# Patient Record
Sex: Female | Born: 1965
Health system: Southern US, Community
[De-identification: ages and names within clinical notes are randomized; demographics above are authoritative.]

## PROBLEM LIST (undated history)

## (undated) DIAGNOSIS — R7309 Other abnormal glucose: Secondary | ICD-10-CM

## (undated) DIAGNOSIS — M1990 Unspecified osteoarthritis, unspecified site: Secondary | ICD-10-CM

## (undated) DIAGNOSIS — M8580 Other specified disorders of bone density and structure, unspecified site: Secondary | ICD-10-CM

## (undated) DIAGNOSIS — K219 Gastro-esophageal reflux disease without esophagitis: Secondary | ICD-10-CM

## (undated) DIAGNOSIS — D649 Anemia, unspecified: Secondary | ICD-10-CM

## (undated) DIAGNOSIS — C439 Malignant melanoma of skin, unspecified: Secondary | ICD-10-CM

## (undated) DIAGNOSIS — I10 Essential (primary) hypertension: Secondary | ICD-10-CM

## (undated) DIAGNOSIS — K5730 Diverticulosis of large intestine without perforation or abscess without bleeding: Secondary | ICD-10-CM

## (undated) DIAGNOSIS — D473 Essential (hemorrhagic) thrombocythemia: Secondary | ICD-10-CM

## (undated) DIAGNOSIS — D259 Leiomyoma of uterus, unspecified: Secondary | ICD-10-CM

## (undated) DIAGNOSIS — E669 Obesity, unspecified: Secondary | ICD-10-CM

## (undated) DIAGNOSIS — G4730 Sleep apnea, unspecified: Secondary | ICD-10-CM

## (undated) HISTORY — DX: Essential (hemorrhagic) thrombocythemia: D47.3

## (undated) HISTORY — DX: Malignant melanoma of skin, unspecified: C43.9

## (undated) HISTORY — PX: COLONOSCOPY: SHX174

## (undated) HISTORY — DX: Anemia, unspecified: D64.9

## (undated) HISTORY — DX: Other specified disorders of bone density and structure, unspecified site: M85.80

## (undated) HISTORY — PX: DIAGNOSTIC LAPAROSCOPY: SUR761

## (undated) HISTORY — DX: Gastro-esophageal reflux disease without esophagitis: K21.9

## (undated) HISTORY — PX: UTERINE FIBROID SURGERY: SHX826

## (undated) HISTORY — DX: Essential (primary) hypertension: I10

## (undated) HISTORY — DX: Obesity, unspecified: E66.9

## (undated) HISTORY — DX: Leiomyoma of uterus, unspecified: D25.9

## (undated) HISTORY — DX: Other abnormal glucose: R73.09

## (undated) HISTORY — PX: OTHER SURGICAL HISTORY: SHX169

## (undated) HISTORY — DX: Diverticulosis of large intestine without perforation or abscess without bleeding: K57.30

---

## 1999-09-24 ENCOUNTER — Other Ambulatory Visit: Admission: RE | Admit: 1999-09-24 | Discharge: 1999-09-24 | Payer: Self-pay | Admitting: Gynecology

## 2000-10-06 ENCOUNTER — Other Ambulatory Visit: Admission: RE | Admit: 2000-10-06 | Discharge: 2000-10-06 | Payer: Self-pay | Admitting: Gynecology

## 2001-10-19 ENCOUNTER — Other Ambulatory Visit: Admission: RE | Admit: 2001-10-19 | Discharge: 2001-10-19 | Payer: Self-pay | Admitting: Gynecology

## 2002-04-11 ENCOUNTER — Other Ambulatory Visit: Admission: RE | Admit: 2002-04-11 | Discharge: 2002-04-11 | Payer: Self-pay | Admitting: Gynecology

## 2003-02-01 ENCOUNTER — Other Ambulatory Visit: Admission: RE | Admit: 2003-02-01 | Discharge: 2003-02-01 | Payer: Self-pay | Admitting: Gynecology

## 2004-05-01 ENCOUNTER — Other Ambulatory Visit: Admission: RE | Admit: 2004-05-01 | Discharge: 2004-05-01 | Payer: Self-pay | Admitting: Gynecology

## 2004-11-03 HISTORY — PX: TUBAL LIGATION: SHX77

## 2004-11-03 HISTORY — PX: KNEE ARTHROSCOPY: SUR90

## 2005-05-05 ENCOUNTER — Other Ambulatory Visit: Admission: RE | Admit: 2005-05-05 | Discharge: 2005-05-05 | Payer: Self-pay | Admitting: Gynecology

## 2005-05-07 ENCOUNTER — Ambulatory Visit: Payer: Self-pay | Admitting: Internal Medicine

## 2005-06-16 ENCOUNTER — Ambulatory Visit: Payer: Self-pay | Admitting: Internal Medicine

## 2005-06-19 ENCOUNTER — Encounter (INDEPENDENT_AMBULATORY_CARE_PROVIDER_SITE_OTHER): Payer: Self-pay | Admitting: Specialist

## 2005-06-19 ENCOUNTER — Ambulatory Visit (HOSPITAL_BASED_OUTPATIENT_CLINIC_OR_DEPARTMENT_OTHER): Admission: RE | Admit: 2005-06-19 | Discharge: 2005-06-19 | Payer: Self-pay | Admitting: Gynecology

## 2005-06-19 HISTORY — PX: HYSTEROSCOPY: SHX211

## 2005-07-16 ENCOUNTER — Ambulatory Visit: Payer: Self-pay | Admitting: Internal Medicine

## 2005-08-22 ENCOUNTER — Ambulatory Visit: Payer: Self-pay | Admitting: Internal Medicine

## 2005-10-13 ENCOUNTER — Ambulatory Visit: Payer: Self-pay | Admitting: Internal Medicine

## 2005-10-16 ENCOUNTER — Ambulatory Visit: Payer: Self-pay | Admitting: Oncology

## 2005-12-05 ENCOUNTER — Ambulatory Visit: Payer: Self-pay | Admitting: Oncology

## 2006-01-05 ENCOUNTER — Ambulatory Visit: Payer: Self-pay | Admitting: Internal Medicine

## 2006-05-04 ENCOUNTER — Ambulatory Visit: Payer: Self-pay | Admitting: Internal Medicine

## 2006-05-08 ENCOUNTER — Other Ambulatory Visit: Admission: RE | Admit: 2006-05-08 | Discharge: 2006-05-08 | Payer: Self-pay | Admitting: Gynecology

## 2006-06-10 ENCOUNTER — Ambulatory Visit (HOSPITAL_BASED_OUTPATIENT_CLINIC_OR_DEPARTMENT_OTHER): Admission: RE | Admit: 2006-06-10 | Discharge: 2006-06-10 | Payer: Self-pay | Admitting: Orthopedic Surgery

## 2006-07-16 ENCOUNTER — Ambulatory Visit: Payer: Self-pay | Admitting: Oncology

## 2006-07-20 LAB — CBC WITH DIFFERENTIAL/PLATELET
Basophils Absolute: 0 10*3/uL (ref 0.0–0.1)
Eosinophils Absolute: 0.2 10*3/uL (ref 0.0–0.5)
HGB: 14.4 g/dL (ref 11.6–15.9)
LYMPH%: 25.1 % (ref 14.0–48.0)
MONO#: 0.8 10*3/uL (ref 0.1–0.9)
NEUT#: 4.5 10*3/uL (ref 1.5–6.5)
Platelets: 724 10*3/uL — ABNORMAL HIGH (ref 145–400)
RBC: 4.69 10*6/uL (ref 3.70–5.32)
WBC: 7.5 10*3/uL (ref 3.9–10.0)

## 2006-07-20 LAB — MORPHOLOGY: RBC Comments: NORMAL

## 2006-07-20 LAB — CHCC SMEAR

## 2006-09-07 ENCOUNTER — Ambulatory Visit: Payer: Self-pay | Admitting: Internal Medicine

## 2006-10-07 ENCOUNTER — Ambulatory Visit: Payer: Self-pay | Admitting: Internal Medicine

## 2007-01-13 ENCOUNTER — Ambulatory Visit: Payer: Self-pay | Admitting: Oncology

## 2007-01-13 ENCOUNTER — Ambulatory Visit: Payer: Self-pay | Admitting: Internal Medicine

## 2007-01-18 LAB — CBC WITH DIFFERENTIAL/PLATELET
BASO%: 0.5 % (ref 0.0–2.0)
HCT: 39.5 % (ref 34.8–46.6)
MCHC: 34.3 g/dL (ref 32.0–36.0)
MONO#: 0.7 10*3/uL (ref 0.1–0.9)
RBC: 4.5 10*6/uL (ref 3.70–5.32)
WBC: 7.6 10*3/uL (ref 3.9–10.0)
lymph#: 1.9 10*3/uL (ref 0.9–3.3)

## 2007-01-18 LAB — MORPHOLOGY: RBC Comments: NORMAL

## 2007-05-25 ENCOUNTER — Other Ambulatory Visit: Admission: RE | Admit: 2007-05-25 | Discharge: 2007-05-25 | Payer: Self-pay | Admitting: Gynecology

## 2007-06-03 ENCOUNTER — Encounter: Payer: Self-pay | Admitting: Internal Medicine

## 2007-07-12 ENCOUNTER — Ambulatory Visit: Payer: Self-pay | Admitting: Internal Medicine

## 2007-07-15 ENCOUNTER — Ambulatory Visit: Payer: Self-pay | Admitting: Oncology

## 2007-07-19 LAB — CBC WITH DIFFERENTIAL/PLATELET
BASO%: 0.4 % (ref 0.0–2.0)
HCT: 39.5 % (ref 34.8–46.6)
LYMPH%: 24.2 % (ref 14.0–48.0)
MCH: 31 pg (ref 26.0–34.0)
MCHC: 35.2 g/dL (ref 32.0–36.0)
MCV: 88.1 fL (ref 81.0–101.0)
MONO%: 7.5 % (ref 0.0–13.0)
NEUT%: 65.5 % (ref 39.6–76.8)
Platelets: 603 10*3/uL — ABNORMAL HIGH (ref 145–400)
RBC: 4.48 10*6/uL (ref 3.70–5.32)

## 2007-07-19 LAB — MORPHOLOGY: RBC Comments: NORMAL

## 2007-09-10 ENCOUNTER — Encounter: Payer: Self-pay | Admitting: Internal Medicine

## 2007-09-10 DIAGNOSIS — I10 Essential (primary) hypertension: Secondary | ICD-10-CM | POA: Insufficient documentation

## 2007-09-10 DIAGNOSIS — D473 Essential (hemorrhagic) thrombocythemia: Secondary | ICD-10-CM | POA: Insufficient documentation

## 2007-09-10 DIAGNOSIS — R7309 Other abnormal glucose: Secondary | ICD-10-CM | POA: Insufficient documentation

## 2007-09-10 DIAGNOSIS — D75839 Thrombocytosis, unspecified: Secondary | ICD-10-CM | POA: Insufficient documentation

## 2007-09-10 DIAGNOSIS — D259 Leiomyoma of uterus, unspecified: Secondary | ICD-10-CM | POA: Insufficient documentation

## 2007-09-10 DIAGNOSIS — E669 Obesity, unspecified: Secondary | ICD-10-CM | POA: Insufficient documentation

## 2007-09-13 ENCOUNTER — Telehealth (INDEPENDENT_AMBULATORY_CARE_PROVIDER_SITE_OTHER): Payer: Self-pay | Admitting: *Deleted

## 2007-09-13 ENCOUNTER — Ambulatory Visit: Payer: Self-pay | Admitting: Internal Medicine

## 2008-01-19 ENCOUNTER — Ambulatory Visit: Payer: Self-pay | Admitting: Oncology

## 2008-01-19 LAB — CBC WITH DIFFERENTIAL/PLATELET
Basophils Absolute: 0 10*3/uL (ref 0.0–0.1)
Eosinophils Absolute: 0.3 10*3/uL (ref 0.0–0.5)
HCT: 40.7 % (ref 34.8–46.6)
HGB: 14.2 g/dL (ref 11.6–15.9)
LYMPH%: 24.9 % (ref 14.0–48.0)
MONO#: 0.6 10*3/uL (ref 0.1–0.9)
NEUT#: 6 10*3/uL (ref 1.5–6.5)
NEUT%: 64.6 % (ref 39.6–76.8)
Platelets: 723 10*3/uL — ABNORMAL HIGH (ref 145–400)
RBC: 4.65 10*6/uL (ref 3.70–5.32)
WBC: 9.3 10*3/uL (ref 3.9–10.0)

## 2008-01-19 LAB — MORPHOLOGY: RBC Comments: NORMAL

## 2008-03-13 ENCOUNTER — Ambulatory Visit: Payer: Self-pay | Admitting: Internal Medicine

## 2008-05-25 ENCOUNTER — Other Ambulatory Visit: Admission: RE | Admit: 2008-05-25 | Discharge: 2008-05-25 | Payer: Self-pay | Admitting: Gynecology

## 2008-07-12 ENCOUNTER — Ambulatory Visit: Payer: Self-pay | Admitting: Oncology

## 2008-07-17 ENCOUNTER — Encounter: Payer: Self-pay | Admitting: Internal Medicine

## 2008-07-17 LAB — CBC WITH DIFFERENTIAL/PLATELET
BASO%: 0.4 % (ref 0.0–2.0)
Basophils Absolute: 0 10*3/uL (ref 0.0–0.1)
Eosinophils Absolute: 0.2 10*3/uL (ref 0.0–0.5)
HCT: 40.3 % (ref 34.8–46.6)
HGB: 13.7 g/dL (ref 11.6–15.9)
LYMPH%: 25.6 % (ref 14.0–48.0)
MONO#: 0.4 10*3/uL (ref 0.1–0.9)
NEUT#: 4.5 10*3/uL (ref 1.5–6.5)
NEUT%: 65.9 % (ref 39.6–76.8)
Platelets: 568 10*3/uL — ABNORMAL HIGH (ref 145–400)
WBC: 6.8 10*3/uL (ref 3.9–10.0)
lymph#: 1.7 10*3/uL (ref 0.9–3.3)

## 2008-07-17 LAB — MORPHOLOGY: PLT EST: INCREASED

## 2008-08-24 ENCOUNTER — Encounter: Payer: Self-pay | Admitting: Internal Medicine

## 2008-09-12 ENCOUNTER — Ambulatory Visit: Payer: Self-pay | Admitting: Internal Medicine

## 2008-09-12 LAB — CONVERTED CEMR LAB
AST: 16 units/L (ref 0–37)
BUN: 9 mg/dL (ref 6–23)
Cholesterol: 148 mg/dL (ref 0–200)
Creatinine, Ser: 0.8 mg/dL (ref 0.4–1.2)
GFR calc Af Amer: 100 mL/min
GFR calc non Af Amer: 83 mL/min
LDL Cholesterol: 80 mg/dL (ref 0–99)
Total CHOL/HDL Ratio: 3.4
Triglycerides: 123 mg/dL (ref 0–149)
VLDL: 25 mg/dL (ref 0–40)

## 2008-09-18 ENCOUNTER — Ambulatory Visit: Payer: Self-pay | Admitting: Internal Medicine

## 2008-09-22 ENCOUNTER — Telehealth (INDEPENDENT_AMBULATORY_CARE_PROVIDER_SITE_OTHER): Payer: Self-pay | Admitting: *Deleted

## 2009-01-04 ENCOUNTER — Ambulatory Visit: Payer: Self-pay | Admitting: Oncology

## 2009-01-08 ENCOUNTER — Encounter: Payer: Self-pay | Admitting: Internal Medicine

## 2009-01-08 LAB — CBC WITH DIFFERENTIAL/PLATELET
BASO%: 0.5 % (ref 0.0–2.0)
Basophils Absolute: 0 10*3/uL (ref 0.0–0.1)
Eosinophils Absolute: 0.2 10*3/uL (ref 0.0–0.5)
HCT: 39.5 % (ref 34.8–46.6)
HGB: 13.4 g/dL (ref 11.6–15.9)
LYMPH%: 29.4 % (ref 14.0–49.7)
MCHC: 33.9 g/dL (ref 31.5–36.0)
MONO#: 0.6 10*3/uL (ref 0.1–0.9)
NEUT%: 58.2 % (ref 38.4–76.8)
Platelets: 645 10*3/uL — ABNORMAL HIGH (ref 145–400)
WBC: 7.1 10*3/uL (ref 3.9–10.3)
lymph#: 2.1 10*3/uL (ref 0.9–3.3)

## 2009-01-08 LAB — MORPHOLOGY: PLT EST: INCREASED

## 2009-03-19 ENCOUNTER — Ambulatory Visit: Payer: Self-pay | Admitting: Internal Medicine

## 2009-03-19 LAB — CONVERTED CEMR LAB
BUN: 11 mg/dL (ref 6–23)
Basophils Relative: 0.6 % (ref 0.0–3.0)
CO2: 30 meq/L (ref 19–32)
Chloride: 108 meq/L (ref 96–112)
Creatinine, Ser: 0.7 mg/dL (ref 0.4–1.2)
Eosinophils Absolute: 0.2 10*3/uL (ref 0.0–0.7)
Eosinophils Relative: 2.9 % (ref 0.0–5.0)
Glucose, Bld: 100 mg/dL — ABNORMAL HIGH (ref 70–99)
Hemoglobin: 13.7 g/dL (ref 12.0–15.0)
LDL Cholesterol: 86 mg/dL (ref 0–99)
Lymphocytes Relative: 22.5 % (ref 12.0–46.0)
MCHC: 34.7 g/dL (ref 30.0–36.0)
Monocytes Relative: 7.1 % (ref 3.0–12.0)
Neutrophils Relative %: 66.9 % (ref 43.0–77.0)
Potassium: 3.9 meq/L (ref 3.5–5.1)
RBC: 4.41 M/uL (ref 3.87–5.11)
Total CHOL/HDL Ratio: 3
VLDL: 19.2 mg/dL (ref 0.0–40.0)
WBC: 7 10*3/uL (ref 4.5–10.5)

## 2009-05-03 LAB — CONVERTED CEMR LAB: Pap Smear: NORMAL

## 2009-06-05 ENCOUNTER — Encounter: Payer: Self-pay | Admitting: Gynecology

## 2009-06-05 ENCOUNTER — Other Ambulatory Visit: Admission: RE | Admit: 2009-06-05 | Discharge: 2009-06-05 | Payer: Self-pay | Admitting: Gynecology

## 2009-06-05 ENCOUNTER — Ambulatory Visit: Payer: Self-pay | Admitting: Gynecology

## 2009-07-12 ENCOUNTER — Ambulatory Visit: Payer: Self-pay | Admitting: Oncology

## 2009-07-16 ENCOUNTER — Encounter: Payer: Self-pay | Admitting: Internal Medicine

## 2009-07-16 LAB — CBC WITH DIFFERENTIAL/PLATELET
BASO%: 0.3 % (ref 0.0–2.0)
Basophils Absolute: 0 10*3/uL (ref 0.0–0.1)
EOS%: 2.4 % (ref 0.0–7.0)
HGB: 13.7 g/dL (ref 11.6–15.9)
MCH: 30.5 pg (ref 25.1–34.0)
NEUT#: 4.3 10*3/uL (ref 1.5–6.5)
RDW: 13.8 % (ref 11.2–14.5)
lymph#: 1.5 10*3/uL (ref 0.9–3.3)

## 2009-09-03 ENCOUNTER — Telehealth: Payer: Self-pay | Admitting: Internal Medicine

## 2009-09-12 ENCOUNTER — Ambulatory Visit: Payer: Self-pay | Admitting: Internal Medicine

## 2009-09-12 LAB — CONVERTED CEMR LAB
Alkaline Phosphatase: 54 units/L (ref 39–117)
Basophils Absolute: 0 10*3/uL (ref 0.0–0.1)
Bilirubin, Direct: 0.1 mg/dL (ref 0.0–0.3)
CO2: 27 meq/L (ref 19–32)
Calcium: 8.8 mg/dL (ref 8.4–10.5)
Creatinine, Ser: 0.8 mg/dL (ref 0.4–1.2)
Eosinophils Absolute: 0.2 10*3/uL (ref 0.0–0.7)
GFR calc non Af Amer: 82.22 mL/min (ref >60)
HDL: 48.2 mg/dL (ref >39.00)
Lymphocytes Relative: 25.4 % (ref 12.0–46.0)
MCHC: 34 g/dL (ref 30.0–36.0)
Neutrophils Relative %: 64.3 % (ref 43.0–77.0)
Nitrite: NEGATIVE
RBC: 4.57 M/uL (ref 3.87–5.11)
RDW: 13.2 % (ref 11.5–14.6)
Specific Gravity, Urine: 1.02 (ref 1.000–1.030)
Total CHOL/HDL Ratio: 4
Total Protein, Urine: NEGATIVE mg/dL
Triglycerides: 145 mg/dL (ref 0.0–149.0)
VLDL: 29 mg/dL (ref 0.0–40.0)
pH: 7 (ref 5.0–8.0)

## 2009-09-17 ENCOUNTER — Ambulatory Visit: Payer: Self-pay | Admitting: Internal Medicine

## 2010-01-10 ENCOUNTER — Ambulatory Visit: Payer: Self-pay | Admitting: Oncology

## 2010-01-14 ENCOUNTER — Encounter: Payer: Self-pay | Admitting: Internal Medicine

## 2010-01-14 LAB — CBC WITH DIFFERENTIAL/PLATELET
Basophils Absolute: 0 10*3/uL (ref 0.0–0.1)
EOS%: 2.3 % (ref 0.0–7.0)
Eosinophils Absolute: 0.2 10*3/uL (ref 0.0–0.5)
HGB: 14.3 g/dL (ref 11.6–15.9)
NEUT#: 6.4 10*3/uL (ref 1.5–6.5)
RDW: 13.8 % (ref 11.2–14.5)
lymph#: 2.3 10*3/uL (ref 0.9–3.3)

## 2010-01-14 LAB — MORPHOLOGY: PLT EST: INCREASED

## 2010-03-15 ENCOUNTER — Ambulatory Visit: Payer: Self-pay | Admitting: Internal Medicine

## 2010-03-15 LAB — CONVERTED CEMR LAB
Chloride: 103 meq/L (ref 96–112)
Cholesterol: 164 mg/dL (ref 0–200)
Creatinine, Ser: 0.8 mg/dL (ref 0.4–1.2)
LDL Cholesterol: 87 mg/dL (ref 0–99)
Potassium: 4.8 meq/L (ref 3.5–5.1)
Triglycerides: 135 mg/dL (ref 0.0–149.0)
VLDL: 27 mg/dL (ref 0.0–40.0)

## 2010-03-19 ENCOUNTER — Ambulatory Visit: Payer: Self-pay | Admitting: Internal Medicine

## 2010-03-19 DIAGNOSIS — K219 Gastro-esophageal reflux disease without esophagitis: Secondary | ICD-10-CM | POA: Insufficient documentation

## 2010-06-03 LAB — HM MAMMOGRAPHY: HM Mammogram: NORMAL

## 2010-06-10 ENCOUNTER — Other Ambulatory Visit: Admission: RE | Admit: 2010-06-10 | Discharge: 2010-06-10 | Payer: Self-pay | Admitting: Gynecology

## 2010-06-10 ENCOUNTER — Ambulatory Visit: Payer: Self-pay | Admitting: Gynecology

## 2010-07-11 ENCOUNTER — Ambulatory Visit: Payer: Self-pay | Admitting: Oncology

## 2010-07-15 ENCOUNTER — Encounter: Payer: Self-pay | Admitting: Internal Medicine

## 2010-07-15 LAB — CBC WITH DIFFERENTIAL/PLATELET
Basophils Absolute: 0 10*3/uL (ref 0.0–0.1)
Eosinophils Absolute: 0.2 10*3/uL (ref 0.0–0.5)
HGB: 14.4 g/dL (ref 11.6–15.9)
LYMPH%: 23.3 % (ref 14.0–49.7)
MCV: 89.6 fL (ref 79.5–101.0)
MONO%: 9.1 % (ref 0.0–14.0)
NEUT#: 5.1 10*3/uL (ref 1.5–6.5)
Platelets: 557 10*3/uL — ABNORMAL HIGH (ref 145–400)

## 2010-07-15 LAB — MORPHOLOGY

## 2010-07-15 LAB — CHCC SMEAR

## 2010-09-17 ENCOUNTER — Ambulatory Visit: Payer: Self-pay | Admitting: Internal Medicine

## 2010-09-19 LAB — CONVERTED CEMR LAB
AST: 23 units/L (ref 0–37)
BUN: 13 mg/dL (ref 6–23)
Basophils Relative: 0.3 % (ref 0.0–3.0)
Bilirubin Urine: NEGATIVE
Calcium: 9.2 mg/dL (ref 8.4–10.5)
Cholesterol: 180 mg/dL (ref 0–200)
Creatinine, Ser: 0.9 mg/dL (ref 0.4–1.2)
Eosinophils Absolute: 0.2 10*3/uL (ref 0.0–0.7)
GFR calc non Af Amer: 73.33 mL/min (ref >60)
HDL: 53 mg/dL (ref >39.00)
Hemoglobin, Urine: NEGATIVE
Lymphs Abs: 1.9 10*3/uL (ref 0.7–4.0)
MCHC: 34.2 g/dL (ref 30.0–36.0)
MCV: 90.7 fL (ref 78.0–100.0)
Monocytes Absolute: 0.6 10*3/uL (ref 0.1–1.0)
Neutrophils Relative %: 63.6 % (ref 43.0–77.0)
Potassium: 5 meq/L (ref 3.5–5.1)
RBC: 4.59 M/uL (ref 3.87–5.11)
TSH: 2.94 microintl units/mL (ref 0.35–5.50)
Total Bilirubin: 0.8 mg/dL (ref 0.3–1.2)
Total Protein, Urine: NEGATIVE mg/dL
Triglycerides: 148 mg/dL (ref 0.0–149.0)
Urine Glucose: NEGATIVE mg/dL
VLDL: 29.6 mg/dL (ref 0.0–40.0)
pH: 6 (ref 5.0–8.0)

## 2010-09-20 ENCOUNTER — Ambulatory Visit: Payer: Self-pay | Admitting: Internal Medicine

## 2010-12-05 NOTE — Assessment & Plan Note (Signed)
Summary: 6 mos f/u // cd   Vital Signs:  Patient profile:   45 year old female Height:      66 inches Weight:      245.25 pounds BMI:     39.73 O2 Sat:      98 % on Room air Temp:     97.9 degrees F oral Pulse rate:   75 / minute BP sitting:   122 / 72  (left arm) Cuff size:   large  Vitals Entered ByZella Ball Ewing (Mar 19, 2010 9:27 AM)  O2 Flow:  Room air CC: 6 Month ROV/RE   CC:  6 Month ROV/RE.  History of Present Illness: not taking the metformin due to GI upset and was only taking for promoting wt loss and strong FH of DM - no hx of DM; has mild intermittent reflux symtpoms but no n/v, abd pain, bowel change , dysphagia, or blood;  no recent wt loss, night sweats, fever or other constitutional symtpoms.  Pt denies CP, sob, doe, wheezing, orthopnea, pnd, worsening LE edema, palps, dizziness or syncope  Pt denies new neuro symptoms such as headache, facial or extremity weakness  Pt denies polydipsia, polyuria, or low sugar symptoms such as shakiness improved with eating.  Overall good compliance with meds, trying to follow low chol, diet, wt stable, little excercise however   Problems Prior to Update: 1)  Preventive Health Care  (ICD-V70.0) 2)  Thrombocythemia  (ICD-238.71) 3)  Other Abnormal Glucose  (ICD-790.29) 4)  Obesity, Unspecified  (ICD-278.00) 5)  Fibroids, Uterus  (ICD-218.9) 6)  Hypertension  (ICD-401.9)  Medications Prior to Update: 1)  Amlodipine Besylate 5 Mg Tabs (Amlodipine Besylate) .Marland Kitchen.. 1 By Mouth Once Daily 2)  Metformin Hcl 500 Mg  Tb24 (Metformin Hcl) .... One By Mouth Two Times A Day 3)  Low-Dose Aspirin 81 Mg  Tabs (Aspirin) .... One By Mouth Once Daily 4)  Benazepril Hcl 10 Mg Tabs (Benazepril Hcl) .Marland Kitchen.. 1 By Mouth Once Daily  Current Medications (verified): 1)  Amlodipine Besylate 5 Mg Tabs (Amlodipine Besylate) .Marland Kitchen.. 1 By Mouth Once Daily 2)  Low-Dose Aspirin 81 Mg  Tabs (Aspirin) .... One By Mouth Once Daily 3)  Benazepril Hcl 10 Mg Tabs  (Benazepril Hcl) .Marland Kitchen.. 1 By Mouth Once Daily 4)  Nexium 40 Mg Cpdr (Esomeprazole Magnesium) .Marland Kitchen.. 1po Once Daily As Needed  Allergies (verified): No Known Drug Allergies  Past History:  Past Surgical History: Last updated: 03/13/2008 Tubal ligation 2006 Right Knee surgery - arthroscopic 2007 (Dr. Audery Amel)  Social History: Last updated: 03/13/2008 Married with 3 children ages 73, 38, and 48. She is employed as a Architectural technologist. Husband is a podiatrist Never Smoked Alcohol use-no  Risk Factors: Smoking Status: never (03/13/2008)  Past Medical History: Hypertension Abnormal gluocse and Obesity Mild Thrombocytopenia - idiopathic (Followed by Dr. Cyndie Chime) Uterine Fibroids GERD  Review of Systems       all otherwise negative per pt -    Physical Exam  General:  alert and overweight-appearing.   Head:  normocephalic and atraumatic.   Eyes:  vision grossly intact, pupils equal, and pupils round.   Ears:  R ear normal and L ear normal.   Nose:  no external deformity and no nasal discharge.   Mouth:  no gingival abnormalities and pharynx pink and moist.   Neck:  supple and no masses.   Lungs:  normal respiratory effort and normal breath sounds.   Heart:  normal rate and regular rhythm.  Abdomen:  soft, non-tender, and normal bowel sounds.   Extremities:  no edema, no erythema    Impression & Recommendations:  Problem # 1:  GERD (ICD-530.81)  Her updated medication list for this problem includes:    Nexium 40 Mg Cpdr (Esomeprazole magnesium) .Marland Kitchen... 1po once daily as needed treat as above, f/u any worsening signs or symptoms   Problem # 2:  HYPERTENSION (ICD-401.9)  Her updated medication list for this problem includes:    Amlodipine Besylate 5 Mg Tabs (Amlodipine besylate) .Marland Kitchen... 1 by mouth once daily    Benazepril Hcl 10 Mg Tabs (Benazepril hcl) .Marland Kitchen... 1 by mouth once daily  BP today: 122/72 Prior BP: 118/80 (09/17/2009)  Labs Reviewed: K+: 4.8  (03/15/2010) Creat: : 0.8 (03/15/2010)   Chol: 164 (03/15/2010)   HDL: 50.10 (03/15/2010)   LDL: 87 (03/15/2010)   TG: 135.0 (03/15/2010) stable overall by hx and exam, ok to continue meds/tx as is    Complete Medication List: 1)  Amlodipine Besylate 5 Mg Tabs (Amlodipine besylate) .Marland Kitchen.. 1 by mouth once daily 2)  Low-dose Aspirin 81 Mg Tabs (Aspirin) .... One by mouth once daily 3)  Benazepril Hcl 10 Mg Tabs (Benazepril hcl) .Marland Kitchen.. 1 by mouth once daily 4)  Nexium 40 Mg Cpdr (Esomeprazole magnesium) .Marland Kitchen.. 1po once daily as needed  Patient Instructions: 1)  Continue all previous medications as before this visit  2)  Please take all new medications as prescribed  - the nexium 3)  Please schedule a follow-up appointment in 6 months with: CPX labs and: 4)  HbgA1C prior to visit, ICD-9: 250.02 Prescriptions: AMLODIPINE BESYLATE 5 MG TABS (AMLODIPINE BESYLATE) 1 by mouth once daily  #90 x 3   Entered and Authorized by:   Corwin Levins MD   Signed by:   Corwin Levins MD on 03/19/2010   Method used:   Print then Give to Patient   RxID:   [id number] BENAZEPRIL HCL 10 MG TABS (BENAZEPRIL HCL) 1 by mouth once daily  #90 x 3   Entered and Authorized by:   Corwin Levins MD   Signed by:   Corwin Levins MD on 03/19/2010   Method used:   Print then Give to Patient   RxID:   [id number] NEXIUM 40 MG CPDR (ESOMEPRAZOLE MAGNESIUM) 1po once daily as needed  #90 x 3   Entered and Authorized by:   Corwin Levins MD   Signed by:   Corwin Levins MD on 03/19/2010   Method used:   Print then Give to Patient   RxID:   [id number]

## 2010-12-05 NOTE — Assessment & Plan Note (Signed)
Summary: 6 mo rov /nws  #   Vital Signs:  Patient profile:   45 year old female Height:      66 inches Weight:      246.13 pounds BMI:     39.87 O2 Sat:      96 % on Room air Temp:     98.7 degrees F oral Pulse rate:   84 / minute BP sitting:   120 / 72  (left arm) Cuff size:   large  Vitals Entered By: Zella Ball Ewing CMA Duncan Dull) (September 20, 2010 9:28 AM)  O2 Flow:  Room air  Preventive Care Screening  Mammogram:    Date:  06/03/2010    Results:  normal   Pap Smear:    Date:  05/03/2010    Results:  normal   CC: 6 month ROV/RE   CC:  6 month ROV/RE.  History of Present Illness: here for wellness, gained overall 10 lbs in the past yr;  still with some recurring right knee pain, on swelling, overall mild; has hx of meniscal tear s/p surgury;  Pt denies CP, worsening sob, doe, wheezing, orthopnea, pnd, worsening LE edema, palps, dizziness or syncope  Pt denies new neuro symptoms such as headache, facial or extremity weakness  Pt denies polydipsia, polyuria.  Overall good compliance with meds, trying to follow low chol, DM diet, wt stable, little excercise however  Denies worsening depressive symptoms, suicidal ideation, or panic.   No fever, wt loss, night sweats, loss of appetite or other constitutional symptoms Denies worsening depressive symptoms, suicidal ideation, or panic.  Overall good compliance with meds, and good tolerability. Pt states good ability with ADL's, low fall risk, home safety reviewed and adequate, no significant change in hearing or vision, trying to follow lower chol diet, and occasionally active only with regular excercise.   Preventive Screening-Counseling & Management      Drug Use:  no.    Problems Prior to Update: 1)  Gerd  (ICD-530.81) 2)  Preventive Health Care  (ICD-V70.0) 3)  Thrombocythemia  (ICD-238.71) 4)  Other Abnormal Glucose  (ICD-790.29) 5)  Obesity, Unspecified  (ICD-278.00) 6)  Fibroids, Uterus  (ICD-218.9) 7)  Hypertension   (ICD-401.9)  Medications Prior to Update: 1)  Amlodipine Besylate 5 Mg Tabs (Amlodipine Besylate) .Marland Kitchen.. 1 By Mouth Once Daily 2)  Low-Dose Aspirin 81 Mg  Tabs (Aspirin) .... One By Mouth Once Daily 3)  Benazepril Hcl 10 Mg Tabs (Benazepril Hcl) .Marland Kitchen.. 1 By Mouth Once Daily 4)  Nexium 40 Mg Cpdr (Esomeprazole Magnesium) .Marland Kitchen.. 1po Once Daily As Needed  Current Medications (verified): 1)  Amlodipine Besylate 5 Mg Tabs (Amlodipine Besylate) .Marland Kitchen.. 1 By Mouth Once Daily 2)  Low-Dose Aspirin 81 Mg  Tabs (Aspirin) .... One By Mouth Once Daily 3)  Benazepril Hcl 10 Mg Tabs (Benazepril Hcl) .Marland Kitchen.. 1 By Mouth Once Daily 4)  Nexium 40 Mg Cpdr (Esomeprazole Magnesium) .Marland Kitchen.. 1po Once Daily As Needed  Allergies (verified): No Known Drug Allergies  Past History:  Past Medical History: Last updated: 03/19/2010 Hypertension Abnormal gluocse and Obesity Mild Thrombocytopenia - idiopathic (Followed by Dr. Cyndie Chime) Uterine Fibroids GERD  Past Surgical History: Last updated: 03/13/2008 Tubal ligation 2006 Right Knee surgery - arthroscopic 2007 (Dr. Audery Amel)  Family History: Last updated: 03/13/2008 She has 2 siblings, a brother and sister who are healthy. Father has CAD with PTCA with stent at age 50; he has a history of high blood pressure, diabetes.  Mother has a history of breast cancer.  Social History: Last updated: 09/20/2010 Married with 3 children ages 9, 61, and 55. 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         all otherwise negative per pt -    Physical Exam  General:  alert and overweight-appearing.   Head:  normocephalic and atraumatic.   Eyes:  vision grossly intact, pupils equal, and pupils round.   Ears:  R ear normal and L ear normal.   Nose:  no external deformity and no nasal discharge.   Mouth:  no gingival abnormalities and pharynx pink and moist.   Neck:  supple and no masses.   Lungs:  normal respiratory effort and normal breath sounds.   Heart:  normal rate and regular rhythm.   Abdomen:  soft, non-tender, and normal bowel sounds.   Msk:  no joint tenderness and no joint swelling.  , left knee with trace crepitus, no effusion and FROM Extremities:  no edema, no erythema  Neurologic:  cranial nerves II-XII intact and strength normal in all extremities.   Skin:  color normal and no rashes.   Psych:  not anxious appearing and not depressed appearing.     Impression & Recommendations:  Problem # 1:  Preventive Health Care (ICD-V70.0) Overall doing well, age appropriate education and counseling updated, referral for preventive services and immunizations addressed, dietary counseling and smoking status adressed , most recent labs reviewed, ecg reviewed I have personally reviewed and have noted 1.The patient's medical and social history 2.Their use of alcohol, tobacco or illicit drugs 3.Their current medications and supplements 4. Functional ability including ADL's, fall risk, home safety risk, hearing & visual impairment  5.Diet and physical activities 6.Evidence for depression or mood disorders The patients weight, height, BMI  have been recorded in the chart I have made referrals, counseling and provided education to  the patient based review of the above   Problem # 2:  HYPERTENSION (ICD-401.9)  Her updated medication list for this problem includes:    Amlodipine Besylate 5 Mg Tabs (Amlodipine besylate) .Marland Kitchen... 1 by mouth once daily    Benazepril Hcl 10 Mg Tabs (Benazepril hcl) .Marland Kitchen... 1 by mouth once daily  BP today: 120/72 Prior BP: 122/72 (03/19/2010)  Labs Reviewed: K+: 5.0 (09/18/2010) Creat: : 0.9 (09/18/2010)   Chol: 180 (09/18/2010)   HDL: 53.00 (09/18/2010)   LDL: 97 (09/18/2010)   TG: 148.0 (09/18/2010) stable overall by hx and exam, ok to continue meds/tx as is   Complete Medication List: 1)  Amlodipine Besylate 5 Mg Tabs (Amlodipine besylate) .Marland Kitchen.. 1 by mouth once daily 2)  Low-dose Aspirin 81 Mg Tabs (Aspirin) .... One by mouth once daily 3)  Benazepril Hcl 10 Mg Tabs (Benazepril hcl) .Marland Kitchen.. 1 by mouth once daily 4)  Nexium 40 Mg Cpdr (Esomeprazole magnesium) .Marland Kitchen.. 1po once daily as needed  Patient Instructions: 1)  Continue all previous medications as before this  visit 2)  Please schedule a follow-up appointment in 1 year for CPX with: 3)  HbgA1C prior to visit, ICD-9: 250.02 4)  Urine Microalbumin prior to visit, ICD-9: Prescriptions: NEXIUM 40 MG CPDR (ESOMEPRAZOLE MAGNESIUM) 1po once daily as needed  #90 x 3   Entered and Authorized by:   Corwin Levins MD   Signed by:   Corwin Levins MD on 09/20/2010   Method used:   Electronically to        [facility]* (retail)       [address]       [address] Shipping/mailing       [place], [place]  [address]       Ph: [phone]       Fax: [phone]   RxID:   [id number] BENAZEPRIL HCL 10 MG TABS (BENAZEPRIL HCL) 1 by mouth once daily  #90 x 3   Entered and Authorized by:   Corwin Levins MD   Signed by:   Corwin Levins MD on 09/20/2010   Method used:   Electronically to        [facility]* (retail)       [address]       [address] Shipping/mailing       [place], [place]  [address]        Ph: [phone]       Fax: [phone]   RxID:   [id number] AMLODIPINE BESYLATE 5 MG TABS (AMLODIPINE BESYLATE) 1 by mouth once daily  #90 x 3   Entered and Authorized by:   Corwin Levins MD   Signed by:   Corwin Levins MD on 09/20/2010   Method used:   Electronically to        [facility]* (retail)       [address]       [address] Shipping/mailing       [place], [place]  [address]       Ph: [phone]       Fax: [phone]   RxID:   [id number]    Orders Added: 1)  Est. Patient 40-64 years [[id number]]

## 2010-12-05 NOTE — Letter (Signed)
Summary: [facility]  [facility]   Imported By: Sherian Rein 07/30/2010 11:28:57  _____________________________________________________________________  External Attachment:    Type:   Image     Comment:   External Document

## 2011-01-30 ENCOUNTER — Ambulatory Visit (INDEPENDENT_AMBULATORY_CARE_PROVIDER_SITE_OTHER): Payer: 59 | Admitting: Gynecology

## 2011-01-30 DIAGNOSIS — R823 Hemoglobinuria: Secondary | ICD-10-CM

## 2011-01-30 DIAGNOSIS — R350 Frequency of micturition: Secondary | ICD-10-CM

## 2011-01-30 DIAGNOSIS — N390 Urinary tract infection, site not specified: Secondary | ICD-10-CM

## 2011-03-21 NOTE — H&P (Signed)
NAMEANNALEI, [name] NO.:  [id number]   MEDICAL RECORD NO.:  [id number]           PATIENT TYPE:   LOCATION:                                 FACILITY:   PHYSICIAN:  Juan H. Lily Peer, M.D.     DATE OF BIRTH:   DATE OF ADMISSION:  06/19/2005  DATE OF DISCHARGE:                                HISTORY & PHYSICAL   CHIEF COMPLAINT:  1.  Prolapsing submucous myoma.  2.  Displaced intrauterine device.  3.  Intermenstrual bleeding.  4.  Request for elective permanent sterilization.  5.  Small left ovarian cyst.   HISTORY:  Patient is a 45 year old gravida 3, para 3, who had a Mirena IUD  placed in September, 2004.  She was seen for a gynecological examination in  July, 2006.  The patient states that over the years, she has been having  bleeding and spotting more days than none.  From last year's exam to this  year's exam, she developed a prolapsing mucus fibroid with a broad-based  pedicle.  Patient was seen for an ultrasound and attempted sonohystogram on  August 15th.  The ultrasound demonstrated uterus and measured 8 x 4.6 x 4.9  cm.  The IUD was positioned within the lower uterine cavity, approximately  2.5 cm below the fundal aspects of the cavity, sitting low.  The right ovary  was normal.  The left ovary, adjacent to the ovary appeared to have an  avascular echo-free, thin, smooth wall cyst, probably a para-tubal cyst,  measuring 25 x 20 x 19 mm.  No change from previous scan of May 13, 2005.  There was no fluid in the cul de sac.  On attempted sonohystogram, the fluid  continued to be extruded, so the extension of the pedicle from the  prolapsing submucous myoma could not be established, or for that matter, if  there was any additional submucous myoma.  The plan was discussed with  [name] to remove the submucous myoma with an operative resectoscope, and  she made sure there was no additional submucous myomas and also to remove  the IUD.  Since she does  not want to have anymore children, we will proceed  with laparoscopic tubal sterilization and possibly removal of this para-  tubal cyst.   PAST MEDICAL HISTORY:  The patient is on lisinopril with hydrochlorothiazide  for hypertension.   OBSTETRICAL/GYNECOLOGICAL HISTORY:  She has had a history of gestational  diabetes.  She has had 3 normal spontaneous vaginal deliveries.   She denies any medical allergies.   FAMILY HISTORY:  Father with non-insulin-dependent diabetes.  Grandmother  with history of non-insulin-dependent diabetes.  Mother with breast cancer,  diagnosed at the age of 74.   The patient's last Pap smear of July, 2006 was normal.  Last mammogram,  July, 2006, which is normal.   PHYSICAL EXAMINATION:  VITAL SIGNS:  Patient weighs 224 pounds.  Height 5  feet, 6 inches tall.  HEENT:  Unremarkable.  NECK:  Supple.  Trachea is midline.  No carotid bruits or thyromegaly.  LUNGS:  Clear to auscultation without rhonchi or  wheezes.  HEART:  Regular rate and rhythm.  No murmurs or gallops.  BREASTS:  Not done.  ABDOMEN:  Soft and nontender.  No rebound or guarding.  PELVIC:  Bartholin's, urethra, and Skene's glands within normal limits.  Vagina/cervix:  The cervix is with evidence of submucous myoma prolapsing  through the external cervical os.  The uterus was anteverted.  No palpable  adnexal masses.  RECTAL:  Unremarkable.   ASSESSMENT:  A 45 year old gravida 3, para 3 with a Mirena intrauterine  device sitting low in the uterine cavity, perhaps being pulled down as a  result of a prolapsing submucous myoma, which is also contributing to her  intermenstrual bleeding.   PLAN:  Proceed with removal of the IUD, resected operative myomectomy,  proceed with laparoscopic tubal sterilization procedure, and removal of the  left para-tubal cyst.  The risks of the operations were discussed with  [name], to include infection, although she will receive prophylactic  antibiotics,  the risks of deep venous thrombosis and subsequent pulmonary  embolism, although she will have PAS stockings for prophylaxis as well.  In  the event of uncontrolled hemorrhage and her needing blood or blood  products, the patient is fully aware of the potential risks of anaphylactic  reactions, hepatitis and AIDS.  Also potential risk of perforation of the  uterus resectoscopic surgery.  Also during laparoscopy, potential risk or  trauma to internal organs requiring surgical intervention and correction of  the defect.  Also, in the event of any technical difficulty that may make it  impossible to complete the operation laparoscopically, patient is fully  aware that she may need an open laparotomy to complete the operation.  All  of these questions were discussed with [name].  She is fully aware also  that she will not be able to have anymore children and the procedure is  permanent.  She fully accepts all of the above-mentioned risks.   Patient is scheduled for removal of Merina IUD followed by resectoscopic  myomectomy, followed by diagnostic laparoscopy, followed by bilateral tubal  sterilization procedure and removal of left tubal cyst on Thursday, August  17th at 7:30 at [facility].      Juan H. Lily Peer, M.D.  Electronically Signed     JHF/MEDQ  D:  06/18/2005  T:  06/18/2005  Job:  [id number]

## 2011-03-21 NOTE — Op Note (Signed)
[name], [name]             ACCOUNT NO.:  [id number]   MEDICAL RECORD NO.:  [id number]          PATIENT TYPE:  AMB   LOCATION:  [facility]                         FACILITY:  [facility]   PHYSICIAN:  Juan H. Lily Peer, M.D.DATE OF BIRTH:  03/04/1966   DATE OF PROCEDURE:  06/19/2005  DATE OF DISCHARGE:                                 OPERATIVE REPORT   SURGEON:  Juan H. Lily Peer, M.D.   INDICATIONS FOR OPERATION:  A 45 year old gravida 3, para 3 with a prolapse  submucous myoma and displaced intrauterine device, also with intramenstrual  bleeding requesting elective permanent sterilization and left para tubal  cyst.   PREOPERATIVE DIAGNOSES:  1.  Dysfunctional uterine bleeding.  2.  Prolapsed submucous myoma.  3.  Displaced intrauterine device.  4.  Left paratubal cyst.  5.  Request for elective permanent sterilization.   POSTOPERATIVE DIAGNOSES:  1.  Dysfunctional uterine bleeding.  2.  Prolapsed submucous myoma.  3.  Displaced intrauterine device.  4.  Left paratubal cyst.  5.  Request for elective permanent sterilization.   ANESTHESIA:  General endotracheal anesthesia.   PROCEDURE PERFORMED:  1.  Resectoscopic myomectomy.  2.  Retrieval of IUD.  3.  Bilateral tubal sterilization procedure Filshie clip technique.  4.  Aspiration of left paratubal cyst.   FINDINGS:  The patient with a prolapsed submucous myoma.  Hysteroscopically,  it was noted that had a wide base attached to the posterior wall of the  lower uterine segment, otherwise the remainder of the intrauterine cavity  and tubal ostia and cervical canal were unremarkable.  Laparoscopic  evaluation demonstrated normal size uterus, bilateral normal tubes with the  exception of a small 2 cm left paratubal cyst. Smooth appearance of the  surface of the liver.   DESCRIPTION OF OPERATION:  After the patient was adequately counseled, she  was taken to the operating room where she underwent a successful general  endotracheal anesthesia. She had received a gram of cefotetan for  prophylaxis. She was placed in the low lithotomy position. The abdomen,  vagina and peroneum were prepped and draped in the usual sterile fashion.  Prior to this, a red rubber Roxan Hockey had been inserted in the bladder in an  effort to evaluate and evacuate the bladder of its contents. An examination  under anesthesia demonstrated slightly retroverted uterus. The first portion  of the operation consisted of placing a weighted speculum in the posterior  vaginal wall and then a Sims retractor in the anterior vaginal vault. The  anterior cervical lip was then grasped with a single-toothed tenaculum.  An  operative scope with an operating port was utilized to visualize the extent  of the prolapsing myoma.  A 3% sorbitol was used as the distending media.  After a thorough inspection of the intrauterine cavity both tube and ostia  were identified and were normal as well as the uterine cavity and the wide-  based pedicle to the prolapsed submucous myoma was attached to the posterior  wall of the lower uterine segment. After this, the operative resectoscope  with a 90 degree wire loop was utilized to transect  the pedicle from its  base and the prolapsed submucous myoma was passed off the operative field  for histological evaluation. Re-inspection of intrauterine cavity  demonstrated good hemostasis. Pre and post pictures had been obtained. Of  note, prior to removing the myoma, the IUD was grasped and retrieved  hysteroscopically and passed off the operative field as well. Once this was  completed, a Hulka tenaculum was placed for manipulation during the second  portion of the operation consisting of the laparoscopy. A small stab  incision was then made in the subumbilical region with dissection due to the  patient's obesity. The rectus fascia was identified, was grasped with a  Kelly clamp and a small incision was made and a  pursestring suture of 3-0  Vicryl suture was placed in an effort to place the Hasson trocar after the  peritoneum had been entered. Once this was accomplished, the suture was  secured to the [facility] trocar sleeve and pneumoperitoneum was established by  insufflating the peritoneal cavity with approximately 3 liters of carbon  dioxide. The patient was then placed in the Trendelenburg position.  A  thorough inspection of the pelvic cavity with the above-mentioned findings.  A second port site was made approximately four fingerbreadth's in the  midline in the lower abdomen under laparoscopic visualization for an  addition port to complete the sterilization procedure. A self-retaining  grasper was then placed at the distal portion of the right fallopian tube,  placed under tension and a Filshie clip was placed at the proximal one-third  portion of the fallopian tube, completely occluding the tube at that  portion.  A similar procedure was carried out on the contralateral side and  attention was placed to the paratubal cyst which was placed under tension so  it was very wide and vascular.  The cyst was aspirated and the fluid was  submitted for cytological evaluation. The rest of the pelvic cavity was  normal. There was no endometriosis. No additional adhesions were noted. The  carbon dioxide was removed from the peritoneal cavity and the 5 mm trocar  site was closed with two interrupted sutures of 4-0 plain catgut suture and  the fascia on the subumbilical incision was secured with a 0 Vicryl suture  in a subcutaneous closure with 3-0 Vicryl suture was utilized and the skin  was reapproximated with 4-0 plain catgut suture in a subcuticular stitch.  A  0.25% Marcaine was utilized for postoperative analgesia. The Hulka tenaculum  was removed. The cervix was inspected. No bleeding. The patient was  extubated, transferred to the recovery room with stable vital signs. Blood loss was minimal. Fluid  resuscitation consisted of 1300 cc of lactated  Ringer's.      Juan H. Lily Peer, M.D.  Electronically Signed     JHF/MEDQ  D:  06/19/2005  T:  06/19/2005  Job:  [id number]

## 2011-03-21 NOTE — Op Note (Signed)
NAMESARAI, JANUARY             ACCOUNT NO.:  [id number]   MEDICAL RECORD NO.:  [id number]          PATIENT TYPE:  AMB   LOCATION:  [facility]                          FACILITY:  [facility]   PHYSICIAN:  Mila Homer. Sherlean Foot, M.D. DATE OF BIRTH:  01/04/1966   DATE OF PROCEDURE:  06/10/2006  DATE OF DISCHARGE:                                 OPERATIVE REPORT   SURGEON:  Mila Homer. Sherlean Foot, M.D.   ASSISTANT:  None.   PREOPERATIVE DIAGNOSES:  Right knee osteoarthritis and meniscal tearing.   POSTOPERATIVE DIAGNOSES:  Right knee osteoarthritis and meniscal tearing.   PROCEDURE:  Right knee arthroscopy with partial medial and partial lateral  meniscectomies, and chondroplasty in all three compartments.   ANESTHESIA:  MAC.   INDICATIONS FOR PROCEDURE:  The patient is a 45 year old with failure of  conservative measures in the knee and MRI evidence of meniscus tearing.  Informed consent was obtained.   DESCRIPTION OF PROCEDURE:  The patient was laid supine and administered MAC  anesthesia.  The right leg was prepped and draped in the usual sterile  fashion.  Inferolateral and inferomedial portals were created with a #11  blade, blunt trocar and cannula.  Diagnostic arthroscopy revealed grade 3  changes over much of the patella and trochlea, and one small area of  unstable cartilage with grade 4 chondromalacia once chondroplasty was  complete, and this was on the trochlea.  I did perform a chondroplasty on  both the patella and the trochlea.  I then went down to the medial  compartment.  She had significant chondromalacia, grade 3, over much of the  medial femoral condyle in the weightbearing portion at 30 degrees; and at 45  degrees, basically grade 4 chondromalacia to unstable articular cartilage  was removed.  I then went to the back of the knee.  She had small radial  tears of the medial meniscus.  This was debrided with a straight basket  forceps and great white shaver.  ACL and PCL appeared  normal.  I went into  the lateral compartment.  The lateral femoral condyle was normal.  The  tibial plateau had very, very soft articular cartilage in an area of about 2  x 2 cm, that, once debrided, was down to grade 3 and some small areas of  grade 4 chondromalacia.  I then did a chondroplasty here, as well as cleaned  up some anterior meniscus tearing on the lateral side.  I then lavaged and  closed with 4-0 nylon sutures, dressed with Adaptic, 4 x 4, sterile Webril  and Ace wrap.  Complications:  None.  Drains:  None.           ______________________________  Mila Homer. Sherlean Foot, M.D.     SDL/MEDQ  D:  06/10/2006  T:  06/10/2006  Job:  [id number]

## 2011-07-14 ENCOUNTER — Other Ambulatory Visit: Payer: Self-pay | Admitting: Oncology

## 2011-07-14 ENCOUNTER — Encounter (HOSPITAL_BASED_OUTPATIENT_CLINIC_OR_DEPARTMENT_OTHER): Payer: 59 | Admitting: Oncology

## 2011-07-14 DIAGNOSIS — R635 Abnormal weight gain: Secondary | ICD-10-CM

## 2011-07-14 DIAGNOSIS — R7309 Other abnormal glucose: Secondary | ICD-10-CM

## 2011-07-14 DIAGNOSIS — D473 Essential (hemorrhagic) thrombocythemia: Secondary | ICD-10-CM

## 2011-07-14 LAB — CBC WITH DIFFERENTIAL/PLATELET
BASO%: 0.4 % (ref 0.0–2.0)
Eosinophils Absolute: 0.2 10*3/uL (ref 0.0–0.5)
HCT: 41.7 % (ref 34.8–46.6)
LYMPH%: 25.4 % (ref 14.0–49.7)
MCHC: 33.9 g/dL (ref 31.5–36.0)
MCV: 87.7 fL (ref 79.5–101.0)
MONO#: 0.6 10*3/uL (ref 0.1–0.9)
MONO%: 8.3 % (ref 0.0–14.0)
NEUT%: 63.4 % (ref 38.4–76.8)
Platelets: 569 10*3/uL — ABNORMAL HIGH (ref 145–400)
RBC: 4.75 10*6/uL (ref 3.70–5.45)

## 2011-07-14 LAB — MORPHOLOGY

## 2011-09-13 ENCOUNTER — Encounter: Payer: Self-pay | Admitting: Internal Medicine

## 2011-09-13 DIAGNOSIS — R7303 Prediabetes: Secondary | ICD-10-CM | POA: Insufficient documentation

## 2011-09-13 DIAGNOSIS — Z0000 Encounter for general adult medical examination without abnormal findings: Secondary | ICD-10-CM | POA: Insufficient documentation

## 2011-09-17 ENCOUNTER — Ambulatory Visit (INDEPENDENT_AMBULATORY_CARE_PROVIDER_SITE_OTHER): Payer: 59 | Admitting: Internal Medicine

## 2011-09-17 ENCOUNTER — Other Ambulatory Visit (INDEPENDENT_AMBULATORY_CARE_PROVIDER_SITE_OTHER): Payer: 59

## 2011-09-17 ENCOUNTER — Encounter: Payer: Self-pay | Admitting: Internal Medicine

## 2011-09-17 VITALS — BP 132/80 | HR 78 | Temp 98.8°F | Ht 66.0 in | Wt 240.4 lb

## 2011-09-17 DIAGNOSIS — Z23 Encounter for immunization: Secondary | ICD-10-CM

## 2011-09-17 DIAGNOSIS — I10 Essential (primary) hypertension: Secondary | ICD-10-CM

## 2011-09-17 DIAGNOSIS — R7309 Other abnormal glucose: Secondary | ICD-10-CM

## 2011-09-17 DIAGNOSIS — Z0000 Encounter for general adult medical examination without abnormal findings: Secondary | ICD-10-CM

## 2011-09-17 DIAGNOSIS — R7302 Impaired glucose tolerance (oral): Secondary | ICD-10-CM

## 2011-09-17 LAB — CBC WITH DIFFERENTIAL/PLATELET
Basophils Relative: 0.5 % (ref 0.0–3.0)
Eosinophils Absolute: 0.2 10*3/uL (ref 0.0–0.7)
Lymphocytes Relative: 22.9 % (ref 12.0–46.0)
MCHC: 33.8 g/dL (ref 30.0–36.0)
Monocytes Relative: 6.8 % (ref 3.0–12.0)
Neutrophils Relative %: 67.8 % (ref 43.0–77.0)
RBC: 4.8 Mil/uL (ref 3.87–5.11)
WBC: 7.8 10*3/uL (ref 4.5–10.5)

## 2011-09-17 LAB — LIPID PANEL
Cholesterol: 168 mg/dL (ref 0–200)
LDL Cholesterol: 97 mg/dL (ref 0–99)
Total CHOL/HDL Ratio: 3
VLDL: 16.6 mg/dL (ref 0.0–40.0)

## 2011-09-17 LAB — URINALYSIS, ROUTINE W REFLEX MICROSCOPIC
Nitrite: NEGATIVE
Total Protein, Urine: NEGATIVE
Urobilinogen, UA: 0.2 (ref 0.0–1.0)

## 2011-09-17 LAB — HEPATIC FUNCTION PANEL
AST: 24 U/L (ref 0–37)
Alkaline Phosphatase: 66 U/L (ref 39–117)
Bilirubin, Direct: 0 mg/dL (ref 0.0–0.3)
Total Bilirubin: 0.6 mg/dL (ref 0.3–1.2)

## 2011-09-17 LAB — BASIC METABOLIC PANEL
Chloride: 104 mEq/L (ref 96–112)
GFR: 79.22 mL/min (ref >60.00)
Potassium: 4.3 mEq/L (ref 3.5–5.1)
Sodium: 139 mEq/L (ref 135–145)

## 2011-09-17 MED ORDER — ESOMEPRAZOLE MAGNESIUM 40 MG PO CPDR: 40.0000 mg | DELAYED_RELEASE_CAPSULE | Freq: Every day | ORAL | Status: DC | PRN

## 2011-09-17 MED ORDER — AMLODIPINE BESYLATE 5 MG PO TABS: 5.0000 mg | ORAL_TABLET | Freq: Every day | ORAL | Status: DC

## 2011-09-17 MED ORDER — BENAZEPRIL HCL 10 MG PO TABS: 10.0000 mg | ORAL_TABLET | Freq: Every day | ORAL | Status: DC

## 2011-09-17 NOTE — Assessment & Plan Note (Signed)
stable overall by hx and exam, most recent data reviewed with pt, and pt to continue medical treatment as before  Lab Results  Component Value Date   HGBA1C 6.5 09/18/2010

## 2011-09-17 NOTE — Assessment & Plan Note (Signed)
stable overall by hx and exam, most recent data reviewed with pt, and pt to continue medical treatment as before  BP Readings from Last 3 Encounters:  09/17/11 132/80  09/20/10 120/72  03/19/10 122/72

## 2011-09-17 NOTE — Patient Instructions (Addendum)
Continue all other medications as before All of your refills were sent to the pharmacy You had the flu shot today You are otherwise up to date with prevention Please go to LAB in the Basement for the blood and/or urine tests to be done today Please call the phone number [phone] (the PhoneTree System) for results of testing in 2-3 days;  When calling, simply dial the number, and when prompted enter the MRN number above (the Medical Record Number) and the # key, then the message should start. Please remember to followup with your GYN for the yearly pap smear and/or mammogram Please return in 1 year for your yearly visit, or sooner if needed, with Lab testing done 3-5 days before

## 2011-09-17 NOTE — Assessment & Plan Note (Signed)

## 2011-09-17 NOTE — Progress Notes (Signed)
  Subjective:    Patient ID: [name], female    DOB: 12/29/1965, 45 y.o.   MRN: [id number]  HPI  Here for wellness and f/u;  Overall doing ok;  Pt denies CP, worsening SOB, DOE, wheezing, orthopnea, PND, worsening LE edema, palpitations, dizziness or syncope.  Pt denies neurological change such as new Headache, facial or extremity weakness.  Pt denies polydipsia, polyuria, or low sugar symptoms. Pt states overall good compliance with treatment and medications, good tolerability, and trying to follow lower cholesterol diet.  Pt denies worsening depressive symptoms, suicidal ideation or panic. No fever, wt loss, night sweats, loss of appetite, or other constitutional symptoms.  Pt states good ability with ADL's, low fall risk, home safety reviewed and adequate, no significant changes in hearing or vision, and occasionally active with exercise.  No other acute complaints at this time.   pharynx Current Outpatient Prescriptions on File Prior to Visit  Medication Sig Dispense Refill  . aspirin 81 MG tablet Take 81 mg by mouth daily.         Review of Systems Review of Systems  Constitutional: Negative for diaphoresis, activity change, appetite change and unexpected weight change.  HENT: Negative for hearing loss, ear pain, facial swelling, mouth sores and neck stiffness.   Eyes: Negative for pain, redness and visual disturbance.  Respiratory: Negative for shortness of breath and wheezing.   Cardiovascular: Negative for chest pain and palpitations.  Gastrointestinal: Negative for diarrhea, blood in stool, abdominal distention and rectal pain.  Genitourinary: Negative for hematuria, flank pain and decreased urine volume.  Musculoskeletal: Negative for myalgias and joint swelling.  Skin: Negative for color change and wound.  Neurological: Negative for syncope and numbness.  Hematological: Negative for adenopathy.  Psychiatric/Behavioral: Negative for hallucinations, self-injury, decreased  concentration and agitation.      Objective:   Physical Exam BP 132/80  Pulse 78  Temp(Src) 98.8 F (37.1 C) (Oral)  Ht 5\' 6"  (1.676 m)  Wt 240 lb 6 oz (109.033 kg)  BMI 38.80 kg/m2  SpO2 98%  LMP 09/12/2011 Physical Exam  VS noted, obese Constitutional: Pt is oriented to person, place, and time. Appears well-developed and well-nourished.  HENT:  Head: Normocephalic and atraumatic.  Right Ear: External ear normal.  Left Ear: External ear normal.  Nose: Nose normal.  Mouth/Throat: Oropharynx is clear and moist.  Eyes: Conjunctivae and EOM are normal. Pupils are equal, round, and reactive to light.  Neck: Normal range of motion. Neck supple. No JVD present. No tracheal deviation present.  Cardiovascular: Normal rate, regular rhythm, normal heart sounds and intact distal pulses.   Pulmonary/Chest: Effort normal and breath sounds normal.  Abdominal: Soft. Bowel sounds are normal. There is no tenderness.  Musculoskeletal: Normal range of motion. Exhibits no edema.  Lymphadenopathy:  Has no cervical adenopathy.  Neurological: Pt is alert and oriented to person, place, and time. Pt has normal reflexes. No cranial nerve deficit.  Skin: Skin is warm and dry. No rash noted.  Psychiatric:  Has  normal mood and affect. Behavior is normal.    Assessment & Plan:

## 2012-06-04 ENCOUNTER — Other Ambulatory Visit: Payer: Self-pay | Admitting: Gynecology

## 2012-06-10 ENCOUNTER — Encounter: Payer: Self-pay | Admitting: Gynecology

## 2012-06-14 ENCOUNTER — Encounter: Payer: Self-pay | Admitting: Gynecology

## 2012-06-14 ENCOUNTER — Ambulatory Visit (INDEPENDENT_AMBULATORY_CARE_PROVIDER_SITE_OTHER): Payer: 59 | Admitting: Gynecology

## 2012-06-14 VITALS — BP 148/96 | Ht 65.5 in | Wt 236.0 lb

## 2012-06-14 DIAGNOSIS — B372 Candidiasis of skin and nail: Secondary | ICD-10-CM

## 2012-06-14 DIAGNOSIS — Z01419 Encounter for gynecological examination (general) (routine) without abnormal findings: Secondary | ICD-10-CM

## 2012-06-14 MED ORDER — NYSTATIN-TRIAMCINOLONE 100000-0.1 UNIT/GM-% EX CREA: TOPICAL_CREAM | Freq: Three times a day (TID) | CUTANEOUS | Status: AC

## 2012-06-14 NOTE — Progress Notes (Signed)
[name] 07/16/1966 [id number]   History:    46 y.o.  for annual gyn exam who had done no complaints today with the exception of irritation underneath her abdomen. She's currently being followed Dr. Oliver Barre her primary physician who is monitoring treating her for her hypertension. Review of her record indicated she has had history in the past of mild essential thrombocytosis and had been followed by Dr. Cephas Darby who had recommend she be on baby aspirin. She last saw him back in 2011 and was released her from her care. She had been tested in the past for the JAK-2 mutation and was negative. Patient's had previous tubal sterilization procedure. 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 Her cycles are reported to be regular otherwise. She has lost 4 pounds since last year. Her last Pap smear was normal in 2011. Patient with no prior history of abnormal Pap smears.  Past medical history,surgical history, family history and social history were all reviewed and documented in the EPIC chart.  Gynecologic History Patient's last menstrual period was 06/08/2012. Contraception: tubal ligation Last Pap: 2011. Results were: normal Last mammogram: 2013. Results were: normal  Obstetric History OB History    Grav Para Term Preterm Abortions TAB SAB Ect Mult Living   3 3 3       3      # Outc Date GA Lbr Len/2nd Wgt Sex Del Anes PTL Lv   1 TRM     F SVD  No Yes   2 TRM     F SVD  No Yes   3 TRM     M SVD  No Yes       ROS: A ROS was performed and pertinent positives and negatives are included in the history.  GENERAL: No fevers or chills. HEENT: No change in vision, no earache, sore throat or sinus congestion. NECK: No pain or stiffness. CARDIOVASCULAR: No chest pain or pressure. No palpitations. PULMONARY: No shortness of breath, cough or wheeze. GASTROINTESTINAL: No abdominal pain, nausea, vomiting or diarrhea, melena or bright red blood per rectum.  GENITOURINARY: No urinary frequency, urgency, hesitancy or dysuria. MUSCULOSKELETAL: No joint or muscle pain, no back pain, no recent trauma. DERMATOLOGIC: No rash, no itching, no lesions. ENDOCRINE: No polyuria, polydipsia, no heat or cold intolerance. No recent change in weight. HEMATOLOGICAL: No anemia or easy bruising or bleeding. NEUROLOGIC: No headache, seizures, numbness, tingling or weakness. PSYCHIATRIC: No depression, no loss of interest in normal activity or change in sleep pattern.     Exam: chaperone present  BP 148/96  Ht 5' 5.5" (1.664 m)  Wt 236 lb (107.049 kg)  BMI 38.68 kg/m2  LMP 06/08/2012  Body mass index is 38.68 kg/(m^2).  General appearance : Well developed well nourished female. No acute distress HEENT: Neck supple, trachea midline, no carotid bruits, no thyroidmegaly Lungs: Clear to auscultation, no rhonchi or wheezes, or rib retractions  Heart: Regular rate and rhythm, no murmurs or gallops Breast:Examined in sitting and supine position were symmetrical in appearance, no palpable masses or tenderness,  no skin retraction, no nipple inversion, no nipple discharge, no skin discoloration, no axillary or supraclavicular lymphadenopathy Abdomen: no palpable masses or tenderness, no rebound or guarding, panniculus with evidence of intertrigo erythematous area noted Extremities: no edema or skin discoloration or tenderness  Pelvic:  Bartholin, Urethra, Skene Glands: Within normal limits             Vagina: No gross lesions or  discharge  Cervix: No gross lesions or discharge  Uterus  anteverted, normal size, shape and consistency, non-tender and mobile  Adnexa  Without masses or tenderness  Anus and perineum  normal   Rectovaginal  normal sphincter tone without palpated masses or tenderness             Hemoccult not done     Assessment/Plan:  46 y.o. female for annual exam with evidence of intertrigo. She will be placed on mytrex cream to apply twice a day for  7-10 days. She is encouraged to continue to do her regular exercise and to adhere to her cholesterol lowering diet. We did discuss today the new Pap smear screening guidelines. She will not need one until next year. She was reminded on importance of monthly self breast examination as well as calcium vitamin D for osteoporosis prevention. Her repeat blood pressures here were as follows 140/96, 150/92, and 146/90. She was instructed to monitor her blood pressures at home and parameters were provided and she will followup with her primary physician Dr. Oliver Barre in the event that hypertensive agent may need to be changed. He will be doing her lab work so no lab work was done today.    Ok Edwards MD, 12:58 PM 06/14/2012

## 2012-06-14 NOTE — Patient Instructions (Addendum)
Health Maintenance, Females A healthy lifestyle and preventative care can promote health and wellness.  Maintain regular health, dental, and eye exams.   Eat a healthy diet. Foods like vegetables, fruits, whole grains, low-fat dairy products, and lean protein foods contain the nutrients you need without too many calories. Decrease your intake of foods high in solid fats, added sugars, and salt. Get information about a proper diet from your caregiver, if necessary.   Regular physical exercise is one of the most important things you can do for your health. Most adults should get at least 150 minutes of moderate-intensity exercise (any activity that increases your heart rate and causes you to sweat) each week. In addition, most adults need muscle-strengthening exercises on 2 or more days a week.    Maintain a healthy weight. The body mass index (BMI) is a screening tool to identify possible weight problems. It provides an estimate of body fat based on height and weight. Your caregiver can help determine your BMI, and can help you achieve or maintain a healthy weight. For adults 20 years and older:   A BMI below 18.5 is considered underweight.   A BMI of 18.5 to 24.9 is normal.   A BMI of 25 to 29.9 is considered overweight.   A BMI of 30 and above is considered obese.   Maintain normal blood lipids and cholesterol by exercising and minimizing your intake of saturated fat. Eat a balanced diet with plenty of fruits and vegetables. Blood tests for lipids and cholesterol should begin at age 20 and be repeated every 5 years. If your lipid or cholesterol levels are high, you are over 50, or you are a high risk for heart disease, you may need your cholesterol levels checked more frequently.Ongoing high lipid and cholesterol levels should be treated with medicines if diet and exercise are not effective.   If you smoke, find out from your caregiver how to quit. If you do not use tobacco, do not start.    If you are pregnant, do not drink alcohol. If you are breastfeeding, be very cautious about drinking alcohol. If you are not pregnant and choose to drink alcohol, do not exceed 1 drink per day. One drink is considered to be 12 ounces (355 mL) of beer, 5 ounces (148 mL) of wine, or 1.5 ounces (44 mL) of liquor.   Avoid use of street drugs. Do not share needles with anyone. Ask for help if you need support or instructions about stopping the use of drugs.   High blood pressure causes heart disease and increases the risk of stroke. Blood pressure should be checked at least every 1 to 2 years. Ongoing high blood pressure should be treated with medicines, if weight loss and exercise are not effective.   If you are 55 to 48 years old, ask your caregiver if you should take aspirin to prevent strokes.   Diabetes screening involves taking a blood sample to check your fasting blood sugar level. This should be done once every 3 years, after age 45, if you are within normal weight and without risk factors for diabetes. Testing should be considered at a younger age or be carried out more frequently if you are overweight and have at least 1 risk factor for diabetes.   Breast cancer screening is essential preventative care for women. You should practice "breast self-awareness." This means understanding the normal appearance and feel of your breasts and may include breast self-examination. Any changes detected, no matter how   small, should be reported to a caregiver. Women in their 20s and 30s should have a clinical breast exam (CBE) by a caregiver as part of a regular health exam every 1 to 3 years. After age 40, women should have a CBE every year. Starting at age 40, women should consider having a mammogram (breast X-ray) every year. Women who have a family history of breast cancer should talk to their caregiver about genetic screening. Women at a high risk of breast cancer should talk to their caregiver about having  an MRI and a mammogram every year.   The Pap test is a screening test for cervical cancer. Women should have a Pap test starting at age 21. Between ages 21 and 29, Pap tests should be repeated every 2 years. Beginning at age 30, you should have a Pap test every 3 years as long as the past 3 Pap tests have been normal. If you had a hysterectomy for a problem that was not cancer or a condition that could lead to cancer, then you no longer need Pap tests. If you are between ages 65 and 70, and you have had normal Pap tests going back 10 years, you no longer need Pap tests. If you have had past treatment for cervical cancer or a condition that could lead to cancer, you need Pap tests and screening for cancer for at least 20 years after your treatment. If Pap tests have been discontinued, risk factors (such as a new sexual partner) need to be reassessed to determine if screening should be resumed. Some women have medical problems that increase the chance of getting cervical cancer. In these cases, your caregiver may recommend more frequent screening and Pap tests.   The human papillomavirus (HPV) test is an additional test that may be used for cervical cancer screening. The HPV test looks for the virus that can cause the cell changes on the cervix. The cells collected during the Pap test can be tested for HPV. The HPV test could be used to screen women aged 30 years and older, and should be used in women of any age who have unclear Pap test results. After the age of 30, women should have HPV testing at the same frequency as a Pap test.   Colorectal cancer can be detected and often prevented. Most routine colorectal cancer screening begins at the age of 50 and continues through age 75. However, your caregiver may recommend screening at an earlier age if you have risk factors for colon cancer. On a yearly basis, your caregiver may provide home test kits to check for hidden blood in the stool. Use of a small camera at  the end of a tube, to directly examine the colon (sigmoidoscopy or colonoscopy), can detect the earliest forms of colorectal cancer. Talk to your caregiver about this at age 50, when routine screening begins. Direct examination of the colon should be repeated every 5 to 10 years through age 75, unless early forms of pre-cancerous polyps or small growths are found.   Hepatitis C blood testing is recommended for all people born from 1945 through 1965 and any individual with known risks for hepatitis C.   Practice safe sex. Use condoms and avoid high-risk sexual practices to reduce the spread of sexually transmitted infections (STIs). Sexually active women aged 25 and younger should be checked for Chlamydia, which is a common sexually transmitted infection. Older women with new or multiple partners should also be tested for Chlamydia. Testing for other   STIs is recommended if you are sexually active and at increased risk.   Osteoporosis is a disease in which the bones lose minerals and strength with aging. This can result in serious bone fractures. The risk of osteoporosis can be identified using a bone density scan. Women ages 102 and over and women at risk for fractures or osteoporosis should discuss screening with their caregivers. Ask your caregiver whether you should be taking a calcium supplement or vitamin D to reduce the rate of osteoporosis.   Menopause can be associated with physical symptoms and risks. Hormone replacement therapy is available to decrease symptoms and risks. You should talk to your caregiver about whether hormone replacement therapy is right for you.   Use sunscreen with a sun protection factor (SPF) of 30 or greater. Apply sunscreen liberally and repeatedly throughout the day. You should seek shade when your shadow is shorter than you. Protect yourself by wearing long sleeves, pants, a wide-brimmed hat, and sunglasses year round, whenever you are outdoors.   Notify your caregiver  of new moles or changes in moles, especially if there is a change in shape or color. Also notify your caregiver if a mole is larger than the size of a pencil eraser.   Stay current with your immunizations.  Document Released: 05/05/2011 Document Revised: 10/09/2011 Document Reviewed: 05/05/2011 [facility] Patient Information 2012 [place], [place].  Intertrigo Intertrigo is a skin condition that occurs in between folds of skin in places on the body that rub together a lot and do not get much ventilation. It is caused by heat, moisture, friction, sweat retention, and lack of air circulation, which produces red, irritated patches and, sometimes, scaling or drainage. People who have diabetes, who are obese, or who have treatment with antibiotics are at increased risk for intertrigo. The most common sites for intertrigo to occur include:  The groin.   The breasts.   The armpits.   Folds of abdominal skin.   Webbed spaces between the fingers or toes.  Intertrigo may be aggravated by:  Sweat.   Feces.   Yeast or bacteria that are present near skin folds.   Urine.   Vaginal discharge.  HOME CARE INSTRUCTIONS  The following steps can be taken to reduce friction and keep the affected area cool and dry:   Expose skin folds to the air.   Keep deep skin folds separated with cotton or linen cloth. Avoid tight fitting clothing that could cause chafing.   Wear open-toed shoes or sandals to help reduce moisture between the toes.   Apply absorbent powders to affected areas as directed by your caregiver.   Apply over-the-counter barrier pastes, such as zinc oxide, as directed by your caregiver.   If you develop a fungal infection in the affected area, your caregiver may have you use antifungal creams.  SEEK MEDICAL CARE IF:   The rash is not improving after 1 week of treatment.   The rash is getting worse (more red, more swollen, more painful, or spreading).   You have a fever or chills.   MAKE SURE YOU:   Understand these instructions.   Will watch your condition.   Will get help right away if you are not doing well or get worse.  Document Released: 10/20/2005 Document Revised: 10/09/2011 Document Reviewed: 04/04/2010 [facility] Patient Information 2012 [place], [place].

## 2012-10-04 ENCOUNTER — Ambulatory Visit (INDEPENDENT_AMBULATORY_CARE_PROVIDER_SITE_OTHER): Payer: 59 | Admitting: Internal Medicine

## 2012-10-04 ENCOUNTER — Other Ambulatory Visit (INDEPENDENT_AMBULATORY_CARE_PROVIDER_SITE_OTHER): Payer: 59

## 2012-10-04 ENCOUNTER — Encounter: Payer: Self-pay | Admitting: Internal Medicine

## 2012-10-04 VITALS — BP 140/80 | HR 75 | Temp 97.0°F | Ht 66.0 in | Wt 234.0 lb

## 2012-10-04 DIAGNOSIS — R7309 Other abnormal glucose: Secondary | ICD-10-CM

## 2012-10-04 DIAGNOSIS — Z23 Encounter for immunization: Secondary | ICD-10-CM

## 2012-10-04 DIAGNOSIS — R7302 Impaired glucose tolerance (oral): Secondary | ICD-10-CM

## 2012-10-04 DIAGNOSIS — Z0000 Encounter for general adult medical examination without abnormal findings: Secondary | ICD-10-CM

## 2012-10-04 LAB — LIPID PANEL
HDL: 51.9 mg/dL (ref >39.00)
Total CHOL/HDL Ratio: 3

## 2012-10-04 LAB — URINALYSIS, ROUTINE W REFLEX MICROSCOPIC
Bilirubin Urine: NEGATIVE
Ketones, ur: NEGATIVE
Urine Glucose: NEGATIVE
Urobilinogen, UA: 0.2 (ref 0.0–1.0)

## 2012-10-04 LAB — CBC WITH DIFFERENTIAL/PLATELET
Basophils Absolute: 0 10*3/uL (ref 0.0–0.1)
Eosinophils Absolute: 0.3 10*3/uL (ref 0.0–0.7)
HCT: 41.8 % (ref 36.0–46.0)
Hemoglobin: 13.7 g/dL (ref 12.0–15.0)
Lymphs Abs: 1.9 10*3/uL (ref 0.7–4.0)
MCHC: 32.8 g/dL (ref 30.0–36.0)
Neutro Abs: 4.8 10*3/uL (ref 1.4–7.7)
Platelets: 639 10*3/uL — ABNORMAL HIGH (ref 150.0–400.0)
RDW: 14.9 % — ABNORMAL HIGH (ref 11.5–14.6)

## 2012-10-04 LAB — BASIC METABOLIC PANEL
CO2: 26 mEq/L (ref 19–32)
Calcium: 9.2 mg/dL (ref 8.4–10.5)
GFR: 84.8 mL/min (ref >60.00)
Potassium: 4.7 mEq/L (ref 3.5–5.1)
Sodium: 138 mEq/L (ref 135–145)

## 2012-10-04 LAB — HEPATIC FUNCTION PANEL
AST: 22 U/L (ref 0–37)
Alkaline Phosphatase: 55 U/L (ref 39–117)
Bilirubin, Direct: 0.1 mg/dL (ref 0.0–0.3)
Total Protein: 7.9 g/dL (ref 6.0–8.3)

## 2012-10-04 LAB — HEMOGLOBIN A1C: Hgb A1c MFr Bld: 6.2 % (ref 4.6–6.5)

## 2012-10-04 NOTE — Patient Instructions (Addendum)
You had the flu shot today Your EKG was OK today Please continue your efforts at being more active, low cholesterol diet, and weight control. Please go to LAB in the Basement for the blood and/or urine tests to be done today You will be contacted by phone if any changes need to be made immediately.  Otherwise, you will receive a letter about your results with an explanation, but please check with MyChart first. Please continue your efforts at being more active, low cholesterol diet, and weight control. Thank you for enrolling in MyChart. Please follow the instructions below to securely access your online medical record. MyChart allows you to send messages to your doctor, view your test results, renew your prescriptions, schedule appointments, and more. To Log into MyChart, please go to [email or web address], and your Username is: [email or web address] Please return in 1 year for your yearly visit, or sooner if needed, with Lab testing done 3-5 days before

## 2012-10-04 NOTE — Assessment & Plan Note (Signed)

## 2012-10-04 NOTE — Assessment & Plan Note (Signed)
stable overall by hx and exam, most recent data reviewed with pt, and pt to continue medical treatment as before Lab Results  Component Value Date   HGBA1C 6.2 10/04/2012

## 2012-10-04 NOTE — Progress Notes (Signed)
Subjective:    Patient ID: [name], female    DOB: 10-Oct-1966, 46 y.o.   MRN: [id number]  HPI  Here for wellness and f/u;  Overall doing ok;  Pt denies CP, worsening SOB, DOE, wheezing, orthopnea, PND, worsening LE edema, palpitations, dizziness or syncope.  Pt denies neurological change such as new Headache, facial or extremity weakness.  Pt denies polydipsia, polyuria, or low sugar symptoms. Pt states overall good compliance with treatment and medications, good tolerability, and trying to follow lower cholesterol diet.  Pt denies worsening depressive symptoms, suicidal ideation or panic. No fever, wt loss, night sweats, loss of appetite, or other constitutional symptoms.  Pt states good ability with ADL's, low fall risk, home safety reviewed and adequate, no significant changes in hearing or vision, and occasionally active with exercise.  No acute complaints Past Medical History  Diagnosis Date  . FIBROIDS, UTERUS 09/10/2007  . GERD 03/19/2010  . HYPERTENSION 09/10/2007  . Obesity, unspecified 09/10/2007  . Other abnormal glucose 09/10/2007  . THROMBOCYTHEMIA 09/10/2007  . Diabetes mellitus     GESTATIONAL  . NSVD (normal spontaneous vaginal delivery)     X3   Past Surgical History  Procedure Date  . Right knee surgery 2007    arthroscopic Dr. Audery Amel  . Resectoscopic polypectomy   . Tubal ligation 2006    FILSHIE CLIP TECHNIQUE  . Hysteroscopy 06/19/2005    ASPIRATION OF LEFT PARATUBAL CYST    reports that she has never smoked. She has never used smokeless tobacco. She reports that she does not drink alcohol or use illicit drugs. family history includes Cancer in her mother; Diabetes in her father; and Hypertension in her father. No Known Allergies Current Outpatient Prescriptions on File Prior to Visit  Medication Sig Dispense Refill  . amLODipine (NORVASC) 5 MG tablet Take 1 tablet (5 mg total) by mouth daily.  90 tablet  3  . aspirin 81 MG tablet Take 81 mg by mouth daily.          . benazepril (LOTENSIN) 10 MG tablet Take 1 tablet (10 mg total) by mouth daily.  90 tablet  3  . calcium carbonate (OS-CAL) 600 MG TABS Take 600 mg by mouth 2 (two) times daily with a meal.      . esomeprazole (NEXIUM) 40 MG capsule Take 1 capsule (40 mg total) by mouth daily as needed.  90 capsule  3  . nystatin-triamcinolone (MYCOLOG II) cream Apply topically 3 (three) times daily.  30 g  2   Review of Systems Review of Systems  Constitutional: Negative for diaphoresis, activity change, appetite change and unexpected weight change.  HENT: Negative for hearing loss, ear pain, facial swelling, mouth sores and neck stiffness.   Eyes: Negative for pain, redness and visual disturbance.  Respiratory: Negative for shortness of breath and wheezing.   Cardiovascular: Negative for chest pain and palpitations.  Gastrointestinal: Negative for diarrhea, blood in stool, abdominal distention and rectal pain.  Genitourinary: Negative for hematuria, flank pain and decreased urine volume.  Musculoskeletal: Negative for myalgias and joint swelling.  Skin: Negative for color change and wound.  Neurological: Negative for syncope and numbness.  Hematological: Negative for adenopathy.  Psychiatric/Behavioral: Negative for hallucinations, self-injury, decreased concentration and agitation.      Objective:   Physical Exam BP 140/80  Pulse 75  Temp 97 F (36.1 C) (Oral)  Ht 5\' 6"  (1.676 m)  Wt 234 lb (106.142 kg)  BMI 37.77 kg/m2  SpO2 96% Physical Exam  VS noted Constitutional: Pt is oriented to person, place, and time. Appears well-developed and well-nourished. Lavella Lemons HENT:  Head: Normocephalic and atraumatic.  Right Ear: External ear normal.  Left Ear: External ear normal.  Nose: Nose normal.  Mouth/Throat: Oropharynx is clear and moist.  Eyes: Conjunctivae and EOM are normal. Pupils are equal, round, and reactive to light.  Neck: Normal range of motion. Neck supple. No JVD present. No tracheal  deviation present.  Cardiovascular: Normal rate, regular rhythm, normal heart sounds and intact distal pulses.   Pulmonary/Chest: Effort normal and breath sounds normal.  Abdominal: Soft. Bowel sounds are normal. There is no tenderness.  Musculoskeletal: Normal range of motion. Exhibits no edema.  Lymphadenopathy:  Has no cervical adenopathy.  Neurological: Pt is alert and oriented to person, place, and time. Pt has normal reflexes. No cranial nerve deficit.  Skin: Skin is warm and dry. No rash noted.  Psychiatric:  Has  normal mood and affect. Behavior is normal.     Assessment & Plan:

## 2012-11-25 ENCOUNTER — Other Ambulatory Visit: Payer: Self-pay | Admitting: Internal Medicine

## 2012-12-01 ENCOUNTER — Telehealth: Payer: Self-pay

## 2012-12-01 MED ORDER — PANTOPRAZOLE SODIUM 40 MG PO TBEC: 40.0000 mg | DELAYED_RELEASE_TABLET | Freq: Every day | ORAL | Status: DC

## 2012-12-01 NOTE — Telephone Encounter (Signed)
Called the patient ok to change.  Sent to pharmacy

## 2012-12-01 NOTE — Telephone Encounter (Signed)
Pt without hx of prior intol or allergy  Ok to change as recommended per routine refills protocol, but only if [facility] with pt

## 2012-12-01 NOTE — Telephone Encounter (Signed)
Please advise as fax from pharmacy to inform [facility] copay $0 for nexium but as of Jan. 1 changing to $25. Pantoprazole is available for $0 copay.  Please advise on change.

## 2013-01-20 ENCOUNTER — Encounter: Payer: Self-pay | Admitting: Gynecology

## 2013-01-20 ENCOUNTER — Ambulatory Visit (INDEPENDENT_AMBULATORY_CARE_PROVIDER_SITE_OTHER): Payer: 59 | Admitting: Gynecology

## 2013-01-20 VITALS — BP 140/88

## 2013-01-20 DIAGNOSIS — N644 Mastodynia: Secondary | ICD-10-CM

## 2013-01-20 NOTE — Progress Notes (Signed)
Patient presented to the office today complaining of left breast tenderness for the past 2 months. Patient denies any breast injury or any nipple discharge. She had a normal mammogram in August of 2013.patient is having normal menstrual cycles and has had a previous tubal ligation. Patient's mother had breast cancer in her 30s.  Breast exam: Both breasts were examined sitting supine position. Right breast with no palpable masses or tenderness no supraclavicular axillary lymphadenopathy  Left breast 12 'oclock/ 3 fingerbreadth's from areolar area there is an irregular tender ridge approx. 1- 2 cm in size. No lymphadenopathy and no nipple discharge.  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 Patient will be referred for a diagnostic mammogram and possible ultrasound of the left breast at same facility where she had previous study for comparison. She was instructed take vitamin E 600 units daily and to cut down all caffeine-containing products. If the mammogram/ultrasound shows any evidence of chronic inflammation we will give her collar antibiotic. If the ultrasound and diagnostic mammogram are negative we will wait for  1-2 months  while she cuts  down on her caffeine and begins to take the vitamin E. If this does not work we will try her then on danazol.

## 2013-01-20 NOTE — Patient Instructions (Addendum)
Breast Tenderness Breast tenderness is a common complaint made by women of all ages. It is also called mastalgia or mastodynia, which means breast pain. The condition can range from mild discomfort to severe pain. It has a variety of causes. Your caregiver will find out the likely cause of your breast tenderness by examining your breasts, asking you about symptoms and perhaps ordering some tests. Breast tenderness usually does not mean you have breast cancer. CAUSES  Breast tenderness has many possible causes. They include:  Premenstrual changes. A week to 10 days before your period, your breasts might ache or feel tender.  Other hormonal causes. These include:  When sexual and physical traits mature (puberty).  Pregnancy.  The time right before and the year after menopause (perimenopause).  The day when it has been 12 months since your last period (menopause).  Large breasts.  Infection (also called mastitis).  Birth control pills.  Breastfeeding. Tenderness can occur if the breasts are overfull with milk or if a milk duct is blocked.  Injury.  Fibrocystic breast changes. This is not cancer (benign). It causes painful breasts that feel lumpy.  Fluid-filled sacs (cysts). Often cysts can be drained in your healthcare provider's office.  Fibroadenoma. This is a tumor that is not cancerous.  Medication side effects. Blood pressure drugs and diuretics (which increase urine flow) sometimes cause breast tenderness.  Previous breast surgery, such as a breast reduction.  Breast cancer. Cancer is rarely the reason breasts are tender. In most women, tenderness is caused by something else. DIAGNOSIS  Several methods can be used to find out why your breasts are tender. They include:  Visual inspection of the breasts.  Examination by hand.  Tests, such as:  Mammogram.  Ultrasound.  Biopsy.  Lab test of any fluid coming from the nipple.  Blood tests.  MRI. TREATMENT    Treatment is directed to the cause of the breast tenderness from doing nothing for minor discomfort, wearing a good support bra but also may include:  Taking over-the-counter medicines for pain or discomfort as directed by your caregiver.  Prescription medicine for breast tenderness related to:  Premenstrual.  Fibrocystic.  Puberty.  Pregnancy.  Menopause.  Previous breast surgery.  Large breasts.  Antibiotics for infection.  Birth control pills for fibrocystic and premenstrual changes.  More frequent feedings or pumping of the breasts and warm compresses for breast engorgement when nursing.  Cold and warm compresses and a good support bra for most breast injuries.  Breast cysts are sometimes drained with a needle (aspiration) or removed with minor surgery.  Fibroadenomas are usually removed with minor surgery.  Changing or stopping the medicine when it is responsible for causing the breast tenderness.  When breast cancer is present with or without causing pain, it is usually treated with major surgery (with or without radiation) and chemotherapy. HOME CARE INSTRUCTIONS  Breast tenderness often can be handled at home. You can try:  Getting fitted for a new bra that provides more support, especially during exercise.  Wearing a more supportive or sports bra while sleeping when your breasts are very tender.  If you have a breast injury, using an ice pack for 15 to 20 minutes. Wrap the pack in a towel. Do not put the ice pack directly on your breast.  If your breasts are too full of milk as a result of breastfeeding, try:  Expressing milk either by hand or with a breast pump.  Applying a warm compress for relief.    over-the-counter pain relievers, if this is OK with your caregiver.  Taking medicine that your caregiver prescribes. These might include antibiotics or birth control pills. Over the long term, your breast tenderness might be eased if you:  Cut  down on caffeine.  Reduce the amount of fat in your diet. Also, learn how to do breast examinations at home. This will help you tell when you have an unusual growth or lump that could cause tenderness. And keep a log of the days and times when your breasts are most tender. This will help you and your caregiver find the right solution. SEEK MEDICAL CARE IF:   Any part of your breast is hard, red and hot to the touch. This could be a sign of infection.  Fluid is coming out of your nipples (and you are not breastfeeding). Especially watch for blood or pus.  You have a fever as well as breast tenderness.  You have a new or painful lump in your breast that remains after your period ends.  You have tried to take care of the pain at home, but it has not gone away.  Your breast pain is getting worse. Or, the pain is making it hard to do the things you usually do during your day. Document Released: 10/02/2008 Document Revised: 01/12/2012 Document Reviewed: 10/02/2008 [facility] Patient Information 2013 [place], [place].   Vitamin E 600 units daily and cut down on ALL caffeine containing products

## 2013-01-21 ENCOUNTER — Telehealth: Payer: Self-pay | Admitting: *Deleted

## 2013-01-21 NOTE — Telephone Encounter (Signed)
Pt informed appt. On 01/24/13 @ 9:30 am. Order faxed.

## 2013-01-21 NOTE — Telephone Encounter (Signed)
Message copied by Aura Camps on Fri Jan 21, 2013  8:59 AM ------      Message from: Ok Edwards      Created: Thu Jan 20, 2013  3:02 PM       Please schedule diagnostic mammogram of left breast and possible ultrasound as a result of left breast tenderness for two months and family history of breast cancer.            Left breast 12 'oclock/ 3 fingerbreadth's from areolar area there is an irregular tender ridge approx. 1- 2 cm in size. No lymphadenopathy and no nipple discharge. Last mammogram nor August 2013 Last study done at [facility]. ------

## 2013-01-24 ENCOUNTER — Encounter: Payer: Self-pay | Admitting: Gynecology

## 2013-01-24 LAB — HM MAMMOGRAPHY

## 2013-03-08 ENCOUNTER — Telehealth: Payer: Self-pay | Admitting: *Deleted

## 2013-03-08 NOTE — Telephone Encounter (Signed)
Please tell patient that I would like to check her total testosterone level as well as an [facility] and then to see me a few days later to discuss a result and options.

## 2013-03-08 NOTE — Telephone Encounter (Signed)
Pt called c/o that for sometime now she has no libido and is very concerned because it is affecting her marriage. I explained to pt that office visit would be best to review medication. Pt said that she was just here in march, she would like to know if you are willing to prescribe or give her any recommendations? Please advise

## 2013-03-09 NOTE — Telephone Encounter (Signed)
Pt said she will wait until annual to do the below.

## 2013-06-15 ENCOUNTER — Encounter: Payer: Self-pay | Admitting: Gynecology

## 2013-06-15 ENCOUNTER — Other Ambulatory Visit (HOSPITAL_COMMUNITY)
Admission: RE | Admit: 2013-06-15 | Discharge: 2013-06-15 | Disposition: A | Discharge status: Home / Self Care | Payer: 59 | Source: Ambulatory Visit | Attending: Gynecology | Admitting: Gynecology

## 2013-06-15 ENCOUNTER — Ambulatory Visit (INDEPENDENT_AMBULATORY_CARE_PROVIDER_SITE_OTHER): Payer: 59 | Admitting: Gynecology

## 2013-06-15 VITALS — BP 128/86 | Ht 65.5 in | Wt 238.0 lb

## 2013-06-15 DIAGNOSIS — R6882 Decreased libido: Secondary | ICD-10-CM

## 2013-06-15 DIAGNOSIS — L304 Erythema intertrigo: Secondary | ICD-10-CM

## 2013-06-15 DIAGNOSIS — N951 Menopausal and female climacteric states: Secondary | ICD-10-CM

## 2013-06-15 DIAGNOSIS — R635 Abnormal weight gain: Secondary | ICD-10-CM

## 2013-06-15 DIAGNOSIS — L538 Other specified erythematous conditions: Secondary | ICD-10-CM

## 2013-06-15 DIAGNOSIS — Z01419 Encounter for gynecological examination (general) (routine) without abnormal findings: Secondary | ICD-10-CM

## 2013-06-15 DIAGNOSIS — Z1151 Encounter for screening for human papillomavirus (HPV): Secondary | ICD-10-CM | POA: Insufficient documentation

## 2013-06-15 MED ORDER — NYSTATIN-TRIAMCINOLONE 100000-0.1 UNIT/GM-% EX CREA: TOPICAL_CREAM | Freq: Three times a day (TID) | CUTANEOUS | Status: DC

## 2013-06-15 NOTE — Progress Notes (Signed)
[name] 1966-03-17 [id number]   History:    47 y.o.  for annual gyn exam with complaint of low sex drive. She is still having menstrual cycles. She is overweight.She's currently being followed Dr. Oliver Barre her primary physician who is monitoring treating her for her hypertension. Review of her record indicated she has had history in the past of mild essential thrombocytosis and had been followed by Dr. Cephas Darby who had recommend she be on baby aspirin. She last saw him back in 2011 and was released her from her care. She had been tested in the past for the JAK-2 mutation and was negative. Patient's had previous tubal sterilization procedure. 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 Patient has always had normal Pap smears. She had diagnostic mammogram in March of this year secondary to mastodynia was normal. She scheduled for full mammogram at the end of this year. She is no longer complained of any breast tenderness. She has complained in the past and currently about abdominal intertrigo.   An in and  Past medical history,surgical history, family history and social history were all reviewed and documented in the EPIC chart.  Gynecologic History Patient's last menstrual period was 06/15/2013. Contraception: tubal ligation Last Pap: 2011. Results were: normal Last mammogram: see above. Results were: see above  Obstetric History OB History  Gravida Para Term Preterm AB SAB TAB Ectopic Multiple Living  3 3 3       3     # Outcome Date GA Lbr Len/2nd Weight Sex Delivery Anes PTL Lv  3 TRM     M SVD  N Y  2 TRM     F SVD  N Y  1 TRM     F SVD  N Y       ROS: A ROS was performed and pertinent positives and negatives are included in the history.  GENERAL: No fevers or chills. HEENT: No change in vision, no earache, sore throat or sinus congestion. NECK: No pain or stiffness. CARDIOVASCULAR: No chest pain or pressure. No palpitations. PULMONARY:  No shortness of breath, cough or wheeze. GASTROINTESTINAL: abdominal rash GENITOURINARY: No urinary frequency, urgency, hesitancy or dysuria. MUSCULOSKELETAL: No joint or muscle pain, no back pain, no recent trauma. DERMATOLOGIC: No rash, no itching, no lesions. ENDOCRINE: No polyuria, polydipsia, no heat or cold intolerance. No recent change in weight. HEMATOLOGICAL: No anemia or easy bruising or bleeding. NEUROLOGIC: No headache, seizures, numbness, tingling or weakness. PSYCHIATRIC: No depression, no loss of interest in normal activity or change in sleep pattern.     Exam: chaperone present  BP 128/86  Ht 5' 5.5" (1.664 m)  Wt 238 lb (107.956 kg)  BMI 38.99 kg/m2  LMP 06/15/2013  Body mass index is 38.99 kg/(m^2).  General appearance : Well developed well nourished female. No acute distress HEENT: Neck supple, trachea midline, no carotid bruits, no thyroidmegaly Lungs: Clear to auscultation, no rhonchi or wheezes, or rib retractions  Heart: Regular rate and rhythm, no murmurs or gallops Breast:Examined in sitting and supine position were symmetrical in appearance, no palpable masses or tenderness,  no skin retraction, no nipple inversion, no nipple discharge, no skin discoloration, no axillary or supraclavicular lymphadenopathy Abdomen: panniculus intertrigo Extremities: no edema or skin discoloration or tenderness  Pelvic:  Bartholin, Urethra, Skene Glands: Within normal limits             Vagina: No gross lesions or discharge  Cervix: No gross lesions or discharge  Uterus  anteverted, normal size, shape and consistency, non-tender and mobile  Adnexa  Without masses or tenderness  Anus and perineum  normal   Rectovaginal  normal sphincter tone without palpated masses or tenderness             Hemoccult not indicated     Assessment/Plan:  47 y.o. female for annual exam with complaints of low sex drive. We will check a total testosterone level today. We'll also get a baseline FSH  today. I have recommended that she might over-the-counter Zestra to apply periclitoral for sexual arousal and to help with her libido. Pap smear was done today. The new guidelines were discussed. Patient's primary physician drawn her lab work. Patient was reminded to schedule her mammogram and to do monthly self breast examination.we discussed the risks benefits and pros and cons of transdermal testosterone and potential risk of breast cancer as well as hirsutism and she rather not go that route.literature formation the perimenopause was provided. For her intertrigo she will be placed on Mytrex cream to apply twice a day for 7-10 days when necessary.    Ok Edwards MD, 1:29 PM 06/15/2013

## 2013-06-15 NOTE — Patient Instructions (Addendum)
Perimenopause Perimenopause is the time when your body begins to move into the menopause (no menstrual period for 12 straight months). It is a natural process. Perimenopause can begin 2 to 8 years before the menopause and usually lasts for one year after the menopause. During this time, your ovaries may or may not produce an egg. The ovaries vary in their production of estrogen and progesterone hormones each month. This can cause irregular menstrual periods, difficulty in getting pregnant, vaginal bleeding between periods and uncomfortable symptoms. CAUSES  Irregular production of the ovarian hormones, estrogen and progesterone, and not ovulating every month.  Other causes include:  Tumor of the pituitary gland in the brain.  Medical disease that affects the ovaries.  Radiation treatment.  Chemotherapy.  Unknown causes.  Heavy smoking and excessive alcohol intake can bring on perimenopause sooner. SYMPTOMS   Hot flashes.  Night sweats.  Irregular menstrual periods.  Decrease sex drive.  Vaginal dryness.  Headaches.  Mood swings.  Depression.  Memory problems.  Irritability.  Tiredness.  Weight gain.  Trouble getting pregnant.  The beginning of losing bone cells (osteoporosis).  The beginning of hardening of the arteries (atherosclerosis). DIAGNOSIS  Your caregiver will make a diagnosis by analyzing your age, menstrual history and your symptoms. They will do a physical exam noting any changes in your body, especially your female organs. Female hormone tests may or may not be helpful depending on the amount and when you produce the female hormones. However, other hormone tests may be helpful (ex. thyroid hormone) to rule out other problems. TREATMENT  The decision to treat during the perimenopause should be made by you and your caregiver depending on how the symptoms are affecting you and your life style. There are various treatments available such as:  Treating  individual symptoms with a specific medication for that symptom (ex. tranquilizer for depression).  Herbal medications that can help specific symptoms.  Counseling.  Group therapy.  No treatment. HOME CARE INSTRUCTIONS   Before seeing your caregiver, make a list of your menstrual periods (when the occur, how heavy they are, how long between periods and how long they last), your symptoms and when they started.  Take the medication as recommended by your caregiver.  Sleep and rest.  Exercise.  Eat a diet that contains calcium (good for your bones) and soy (acts like estrogen hormone).  Do not smoke.  Avoid alcoholic beverages.  Taking vitamin E may help in certain cases.  Take calcium and vitamin D supplements to help prevent bone loss.  Group therapy is sometimes helpful.  Acupuncture may help in some cases. SEEK MEDICAL CARE IF:   You have any of the above and want to know if it is perimenopause.  You want advice and treatment for any of your symptoms mentioned above.  You need a referral to a specialist (gynecologist, psychiatrist or psychologist). SEEK IMMEDIATE MEDICAL CARE IF:   You have vaginal bleeding.  Your period lasts longer than 8 days.  You periods are recurring sooner than 21 days.  You have bleeding after intercourse.  You have severe depression.  You have pain when you urinate.  You have severe headaches.  You develop vision problems. Document Released: 11/27/2004 Document Revised: 01/12/2012 Document Reviewed: 08/17/2008 ExitCare Patient Information 2014 [employer].  

## 2013-06-16 LAB — FOLLICLE STIMULATING HORMONE: FSH: 6.2 m[IU]/mL

## 2013-07-06 ENCOUNTER — Telehealth: Payer: Self-pay | Admitting: Gastroenterology

## 2013-07-06 ENCOUNTER — Telehealth: Payer: Self-pay | Admitting: Internal Medicine

## 2013-07-06 DIAGNOSIS — K921 Melena: Secondary | ICD-10-CM

## 2013-07-06 NOTE — Telephone Encounter (Signed)
Patient is scheduled with Willette Cluster RNP at 10:30 07/07/13

## 2013-07-06 NOTE — Telephone Encounter (Signed)
Patient called back and was informed of Dr. Melvyn Novas recommendation, she declined appt with PC and was transferred to GI to schedule with them, referral already entered, will call back if she feels she wants to be seen in [facility] tomorrow

## 2013-07-06 NOTE — Telephone Encounter (Signed)
Patient informed of MD instructions. 

## 2013-07-06 NOTE — Telephone Encounter (Signed)
Pt called back to see if she needs an appt here or in Gastro.  Call her cell if she doesn't answer on the home phone.

## 2013-07-06 NOTE — Telephone Encounter (Signed)
Patient Information:  Caller Name: [name]  Phone: [phone]  Patient: [name], [name]  Gender: Female  DOB: Feb 27, 1966  Age: 47 Years  PCP: Oliver Barre (Adults only)  Pregnant: No  Office Follow Up:  Does the office need to follow up with this patient?: Yes  Instructions For The Office: HAS ED or  Go to OFFICE NOW (with PCP approval )disposition- no appointment available until 16:15. [name] requesting to see a Solicitor. ( Husband is a physician). Please call [name] regarding disposition.   Symptoms  Reason For Call & Symptoms: [name] states she had blood on back part of her panties at 08:30 on 07/06/13. Is not having menses. Occassionallly has has rectal bleeding due to possible hemorrhoids. States she had a BM at 08:35 and tissue was red and toilet water was bright red. No rectal or abdominal pain.  Had a second episode of rectal bleeding with red toilet water  at 11:00 when urinating- did not have a stool.  Passed bright red blood rectally without stool again at 12:00 and 12:55. Per rectal bleeding protocol has go to ED now ( or to office with PCP approval) due to bloody bowel movments. No dizziness or lightheadedness. No appointment available until 16:15 . [name] asking should she be seen by Gastroenterology ?- prefers to see gastroenterology. PLEASE CALL [name] at [phone] regarding ED disposition or work in appt.  Reviewed Health History In EMR: Yes  Reviewed Medications In EMR: Yes  Reviewed Allergies In EMR: Yes  Reviewed Surgeries / Procedures: Yes  Date of Onset of Symptoms: 07/06/2013 OB / GYN:  LMP: 06/15/2013  Guideline(s) Used:  Rectal Bleeding  Diarrhea  Disposition Per Guideline:   Go to ED Now (or to Office with PCP Approval)  Reason For Disposition Reached:   Bloody, black, or tarry bowel movements  Advice Given:  N/A  Patient Will Follow Care Advice:  YES

## 2013-07-06 NOTE — Telephone Encounter (Signed)
If only one time, small volume and not recurred, ok for GI referral (Urgent) - done per emr  If any other such as fever, more blood, pain, dizziness or other symptoms  - would need ER, UC or seen in our office asap

## 2013-07-07 ENCOUNTER — Ambulatory Visit (INDEPENDENT_AMBULATORY_CARE_PROVIDER_SITE_OTHER): Payer: 59 | Admitting: Nurse Practitioner

## 2013-07-07 ENCOUNTER — Encounter: Payer: Self-pay | Admitting: *Deleted

## 2013-07-07 VITALS — BP 128/70 | HR 90 | Ht 65.75 in | Wt 234.0 lb

## 2013-07-07 DIAGNOSIS — K648 Other hemorrhoids: Secondary | ICD-10-CM

## 2013-07-07 DIAGNOSIS — K219 Gastro-esophageal reflux disease without esophagitis: Secondary | ICD-10-CM

## 2013-07-07 DIAGNOSIS — K625 Hemorrhage of anus and rectum: Secondary | ICD-10-CM

## 2013-07-07 MED ORDER — MOVIPREP 100 G PO SOLR: 1.0000 | Freq: Once | ORAL | Status: DC

## 2013-07-07 MED ORDER — HYDROCORTISONE ACETATE 25 MG RE SUPP: 25.0000 mg | Freq: Every day | RECTAL | Status: DC

## 2013-07-07 NOTE — Progress Notes (Signed)
HPI :  Patient is a 47 year old female, new to this practice, referred by PCP for evaluation of rectal bleeding. Yesterday patient had several episodes of bright red blood with her bowel movements. Patient gives a history of hemorrhoids. She occasionally strains to defecate but for the most part stools are soft and she has 1 to 3 bowel movements a day. No straining prior to bleeding yesterday so patient is at a loss as to why she bled. Bleeding was completely painless. Today her bowel movements are back to normal without any blood. Patient takes a daily baby aspirin for history of thrombocytosis which has apparently been stable for several years. Patient has occasional heartburn 1 to 2 times a week for which she takes a PPI. No other GI symptoms. Weight is stable. No family history of colon cancer.  Past Medical History  Diagnosis Date  . FIBROIDS, UTERUS   . GERD   . HYPERTENSION   . Obesity, unspecified   . Other abnormal glucose   . THROMBOCYTHEMIA   . Diabetes mellitus     GESTATIONAL  . NSVD (normal spontaneous vaginal delivery)     X3      Family History  Problem Relation Age of Onset  . Breast cancer Mother   . Diabetes Father   . Hypertension Father    History  Substance Use Topics  . Smoking status: Never Smoker   . Smokeless tobacco: Never Used  . Alcohol Use: Yes     Comment: occassionally   Current Outpatient Prescriptions  Medication Sig Dispense Refill  . amLODipine (NORVASC) 5 MG tablet TAKE 1 TABLET (5 MG TOTAL) BY MOUTH DAILY.  90 tablet  3  . aspirin 81 MG tablet Take 81 mg by mouth daily.        . benazepril (LOTENSIN) 10 MG tablet TAKE 1 TABLET BY MOUTH DAILY.  90 tablet  3  . calcium carbonate (OS-CAL) 600 MG TABS Take 600 mg by mouth 2 (two) times daily with a meal.      . loratadine (ALLERGY) 10 MG tablet Take 10 mg by mouth daily.      Marland Kitchen nystatin-triamcinolone (MYCOLOG II) cream Apply topically 3 (three) times daily.  30 g  2  . pantoprazole (PROTONIX)  40 MG tablet Take 40 mg by mouth daily as needed.      . hydrocortisone (ANUSOL-HC) 25 MG suppository Place 1 suppository (25 mg total) rectally at bedtime.  7 suppository  1  . MOVIPREP 100 G SOLR Take 1 kit (200 g total) by mouth once.  1 kit  0   No current facility-administered medications for this visit.   No Known Allergies  Review of Systems: All systems reviewed and negative except where noted in HPI.   Physical Exam: BP 128/70  Pulse 90  Ht 5' 5.75" (1.67 m)  Wt 234 lb (106.142 kg)  BMI 38.06 kg/m2  LMP 06/15/2013 Constitutional: Pleasant,well-developed white female in no acute distress. HEENT: Normocephalic and atraumatic. Conjunctivae are normal. No scleral icterus. Neck supple.  Cardiovascular: Normal rate, regular rhythm.  Pulmonary/chest: Effort normal and breath sounds normal. No wheezing, rales or rhonchi. Abdominal: Soft, nondistended, nontender. Bowel sounds active throughout. There are no masses palpable. No hepatomegaly. Rectal: Mildly swollen external hemorrhoids. On anoscopy there were internal hemorrhoids as well. Soft, light brown stool in vault Extremities: no edema Lymphadenopathy: No cervical adenopathy noted. Neurological: Alert and oriented to person place and time. Skin: Skin is warm and dry. No rashes noted. Psychiatric: Normal  mood and affect. Behavior is normal.   ASSESSMENT AND PLAN: 21. 47 year old female presenting with several episodes of painless rectal bleeding yesterday. Stools normal without blood today.  Suspect hemorrhoidal bleeding but given that patient is 47 years of age it would not be unreasonable to proceed with colonoscopy to exclude neoplasm or other possibilities. The risks, benefits, and alternatives to colonoscopy with possible biopsy and possible polypectomy were discussed with the patient and she consents to proceed.   2. internal hemorrhoids. Will treat with Anusol suppositories at night for 7 days.

## 2013-07-07 NOTE — Patient Instructions (Addendum)
You have been scheduled for a colonoscopy with propofol. Please follow written instructions given to you at your visit today.  Please pick up your prep kit at the pharmacy within the next 1-3 days. If you use inhalers (even only as needed), please bring them with you on the day of your procedure. Your physician has requested that you go to www.startemmi.com and enter the access code given to you at your visit today. This web site gives a general overview about your procedure. However, you should still follow specific instructions given to you by our office regarding your preparation for the procedure.  We have sent the following medications to your pharmacy for you to pick up at your convenience: Anusol

## 2013-08-08 ENCOUNTER — Encounter: Payer: Self-pay | Admitting: Gastroenterology

## 2013-08-08 ENCOUNTER — Ambulatory Visit (AMBULATORY_SURGERY_CENTER): Payer: 59 | Admitting: Gastroenterology

## 2013-08-08 VITALS — BP 139/78 | HR 74 | Temp 97.6°F | Resp 19 | Ht 65.0 in | Wt 234.0 lb

## 2013-08-08 DIAGNOSIS — K625 Hemorrhage of anus and rectum: Secondary | ICD-10-CM

## 2013-08-08 DIAGNOSIS — K5730 Diverticulosis of large intestine without perforation or abscess without bleeding: Secondary | ICD-10-CM

## 2013-08-08 DIAGNOSIS — Z1211 Encounter for screening for malignant neoplasm of colon: Secondary | ICD-10-CM

## 2013-08-08 MED ORDER — SODIUM CHLORIDE 0.9 % IV SOLN: 500.0000 mL | INTRAVENOUS | Status: DC

## 2013-08-08 NOTE — Progress Notes (Signed)
PATIENT CALLED THIS NURSE TO BEDSIDE, PATIENT DISCOVERED TOURNIQUET  STILL IN PLACE ABOVE IV SITE. APOLOGIZED TO PATIENT, NO SWELLING NOTED AT PREVIOUS IV SITE.

## 2013-08-08 NOTE — Progress Notes (Signed)
Patient did not experience any of the following events: a burn prior to discharge; a fall within the facility; wrong site/side/patient/procedure/implant event; or a hospital transfer or hospital admission upon discharge from the facility. (G8907) Patient did not have preoperative order for IV antibiotic SSI prophylaxis. (G8918)  

## 2013-08-08 NOTE — Op Note (Signed)
[facility] [address] [place], [address]   COLONOSCOPY PROCEDURE REPORT  PATIENT: [name], [name]  MR#: [id number] BIRTHDATE: 05-23-1966 , 47  yrs. old GENDER: Female ENDOSCOPIST: Mardella Layman, MD, [facility] REFERRED XB:JYNWG John, M.D. PROCEDURE DATE:  08/08/2013 PROCEDURE:   Colonoscopy, screening First Screening Colonoscopy - Avg.  risk and is 50 yrs.  old or older Yes.  Prior Negative Screening - Now for repeat screening. N/A  History of Adenoma - Now for follow-up colonoscopy & has been > or = to 3 yrs.  N/A ASA CLASS:   Class II INDICATIONS:Rectal Bleeding and average risk screening. MEDICATIONS: Propofol (Diprivan) 230 mg IV  DESCRIPTION OF PROCEDURE:   After the risks benefits and alternatives of the procedure were thoroughly explained, informed consent was obtained.  A digital rectal exam revealed no abnormalities of the rectum.   The LB [id number]  endoscope was introduced through the anus and advanced to the cecum, which was identified by both the appendix and ileocecal valve. No adverse events experienced.   The quality of the prep was good, using MoviPrep  The instrument was then slowly withdrawn as the colon was fully examined.      COLON FINDINGS: There was severe diverticulosis noted in the descending colon and sigmoid colon with associated colonic spasm and petechiae.  No bleeding was noted from the diverticulosis. Retroflexed views revealed no abnormalities. The time to cecum=5 minutes 30 seconds.  Withdrawal time=6 minutes 01 seconds.  The scope was withdrawn and the procedure completed. COMPLICATIONS: There were no complications.  ENDOSCOPIC IMPRESSION: There was severe diverticulosis noted in the descending colon and sigmoid colon ..no polyps or colitis or bleeding hemorrhoids noted.  RECOMMENDATIONS: 1.  Continue current medications 2.  High fiber diet 3.  Benefiber 1 tablespoon with food bid... $. F/U exam 10y or  prn   eSigned:  Mardella Layman, MD, [facility] 08/08/2013 2:08 PM   cc: Willette Cluster ACNP-BC   PATIENT NAME:  [name], [name] MR#: [id number]

## 2013-08-08 NOTE — Progress Notes (Signed)
Procedure ends, to recovery, report given and VSS. 

## 2013-08-08 NOTE — Patient Instructions (Signed)
YOU HAD AN ENDOSCOPIC PROCEDURE TODAY AT THE [facility]: Refer to the procedure report that was given to you for any specific questions about what was found during the examination.  If the procedure report does not answer your questions, please call your gastroenterologist to clarify.  If you requested that your care partner not be given the details of your procedure findings, then the procedure report has been included in a sealed envelope for you to review at your convenience later.  YOU SHOULD EXPECT: Some feelings of bloating in the abdomen. Passage of more gas than usual.  Walking can help get rid of the air that was put into your GI tract during the procedure and reduce the bloating. If you had a lower endoscopy (such as a colonoscopy or flexible sigmoidoscopy) you may notice spotting of blood in your stool or on the toilet paper. If you underwent a bowel prep for your procedure, then you may not have a normal bowel movement for a few days.  DIET: Your first meal following the procedure should be a light meal and then it is ok to progress to your normal diet.  A half-sandwich or bowl of soup is an example of a good first meal.  Heavy or fried foods are harder to digest and may make you feel nauseous or bloated.  Likewise meals heavy in dairy and vegetables can cause extra gas to form and this can also increase the bloating.  Drink plenty of fluids but you should avoid alcoholic beverages for 24 hours.  ACTIVITY: Your care partner should take you home directly after the procedure.  You should plan to take it easy, moving slowly for the rest of the day.  You can resume normal activity the day after the procedure however you should NOT DRIVE or use heavy machinery for 24 hours (because of the sedation medicines used during the test).    SYMPTOMS TO REPORT IMMEDIATELY: A gastroenterologist can be reached at any hour.  During normal business hours, 8:30 AM to 5:00 PM Monday through Friday,  call [phone].  After hours and on weekends, please call the GI answering service at [phone] who will take a message and have the physician on call contact you.   Following lower endoscopy (colonoscopy or flexible sigmoidoscopy):  Excessive amounts of blood in the stool  Significant tenderness or worsening of abdominal pains  Swelling of the abdomen that is new, acute  Fever of 100F or higher    FOLLOW UP: If any biopsies were taken you will be contacted by phone or by letter within the next 1-3 weeks.  Call your gastroenterologist if you have not heard about the biopsies in 3 weeks.  Our staff will call the home number listed on your records the next business day following your procedure to check on you and address any questions or concerns that you may have at that time regarding the information given to you following your procedure. This is a courtesy call and so if there is no answer at the home number and we have not heard from you through the emergency physician on call, we will assume that you have returned to your regular daily activities without incident.  SIGNATURES/CONFIDENTIALITY: You and/or your care partner have signed paperwork which will be entered into your electronic medical record.  These signatures attest to the fact that that the information above on your After Visit Summary has been reviewed and is understood.  Full responsibility of the confidentiality   of this discharge information lies with you and/or your care-partner.     

## 2013-08-09 ENCOUNTER — Telehealth: Payer: Self-pay | Admitting: *Deleted

## 2013-08-09 NOTE — Telephone Encounter (Signed)
  Follow up Call-  Call back number 08/08/2013  Post procedure Call Back phone  # [phone]  Permission to leave phone message Yes     Patient questions:  Do you have a fever, pain , or abdominal swelling? no Pain Score  0 *  Have you tolerated food without any problems? yes  Have you been able to return to your normal activities? yes  Do you have any questions about your discharge instructions: Diet   no Medications  no Follow up visit  no  Do you have questions or concerns about your Care? no  Actions: * If pain score is 4 or above: No action needed, pain <4.

## 2013-09-01 ENCOUNTER — Encounter: Payer: Self-pay | Admitting: *Deleted

## 2013-09-06 ENCOUNTER — Telehealth: Payer: Self-pay

## 2013-09-06 DIAGNOSIS — Z0000 Encounter for general adult medical examination without abnormal findings: Secondary | ICD-10-CM

## 2013-09-06 NOTE — Telephone Encounter (Signed)
Labs entered.

## 2013-09-08 ENCOUNTER — Encounter: Payer: Self-pay | Admitting: Gastroenterology

## 2013-09-08 ENCOUNTER — Other Ambulatory Visit: Payer: Self-pay

## 2013-09-08 ENCOUNTER — Ambulatory Visit (INDEPENDENT_AMBULATORY_CARE_PROVIDER_SITE_OTHER): Payer: 59 | Admitting: Gastroenterology

## 2013-09-08 VITALS — BP 120/80 | HR 72 | Ht 65.5 in | Wt 236.2 lb

## 2013-09-08 DIAGNOSIS — K219 Gastro-esophageal reflux disease without esophagitis: Secondary | ICD-10-CM

## 2013-09-08 DIAGNOSIS — K5730 Diverticulosis of large intestine without perforation or abscess without bleeding: Secondary | ICD-10-CM

## 2013-09-08 MED ORDER — HYOSCYAMINE SULFATE 0.125 MG SL SUBL: 0.1250 mg | SUBLINGUAL_TABLET | SUBLINGUAL | Status: DC | PRN

## 2013-09-08 NOTE — Patient Instructions (Addendum)
Please follow up in one year  We have sent the following medications to your pharmacy for you to pick up at your convenience: Levsin, please take every four hours as needed  You watched a video today on Diverticulosis and information was given for your review

## 2013-09-08 NOTE — Progress Notes (Signed)
History of Present Illness: This is a very46 year old Caucasian female who recently had colonoscopy because of some hemorrhoidal bleeding. She was found to have severe left colon diverticulosis, and was placed on a high fiber diet with daily Benefiber. She currently is asymptomatic except for occasional left lower quadrant spasmodic discomfort. She's having regular bowel movements without melena or hematochezia. She does have chronic GERD and is on daily PPI medication. Her appetite is good her weight is stable. She does like to eat nuts and popcorn.    Current Medications, Allergies, Past Medical History, Past Surgical History, Family History and Social History were reviewed in [employer] record.  ROS: All systems were reviewed and are negative unless otherwise stated in the HPI.         Assessment and plan: Severe diverticulosis in a relatively young patient with chronic GERD and hemorrhoidal bleeding. I have shown her our patient education video on diverticulosis and its management, and I prescribed when necessary sublingual Levsin 0.125 mg every 6-8 hours for colonic spasms. She's continue high fiber diet and daily Benefiber as previously suggested. She has periodic GERD which he uses periodic PPI therapy but denies dysphagia or other significant acid reflux symptoms

## 2013-12-05 ENCOUNTER — Other Ambulatory Visit: Payer: Self-pay | Admitting: Internal Medicine

## 2013-12-06 ENCOUNTER — Other Ambulatory Visit: Payer: Self-pay

## 2013-12-06 MED ORDER — AMLODIPINE BESYLATE 5 MG PO TABS: 5.0000 mg | ORAL_TABLET | Freq: Every day | ORAL | Status: DC

## 2013-12-06 MED ORDER — BENAZEPRIL HCL 10 MG PO TABS: 10.0000 mg | ORAL_TABLET | Freq: Every day | ORAL | Status: DC

## 2013-12-13 ENCOUNTER — Other Ambulatory Visit (INDEPENDENT_AMBULATORY_CARE_PROVIDER_SITE_OTHER): Payer: 59

## 2013-12-13 DIAGNOSIS — Z0000 Encounter for general adult medical examination without abnormal findings: Secondary | ICD-10-CM

## 2013-12-13 LAB — CBC WITH DIFFERENTIAL/PLATELET
BASOS ABS: 0 10*3/uL (ref 0.0–0.1)
Basophils Relative: 0.5 % (ref 0.0–3.0)
EOS PCT: 5 % (ref 0.0–5.0)
Eosinophils Absolute: 0.5 10*3/uL (ref 0.0–0.7)
HCT: 42.2 % (ref 36.0–46.0)
Hemoglobin: 13.5 g/dL (ref 12.0–15.0)
LYMPHS PCT: 26.7 % (ref 12.0–46.0)
Lymphs Abs: 2.8 10*3/uL (ref 0.7–4.0)
MCHC: 32.1 g/dL (ref 30.0–36.0)
MCV: 88.1 fl (ref 78.0–100.0)
MONOS PCT: 6.9 % (ref 3.0–12.0)
Monocytes Absolute: 0.7 10*3/uL (ref 0.1–1.0)
NEUTROS PCT: 60.9 % (ref 43.0–77.0)
Neutro Abs: 6.4 10*3/uL (ref 1.4–7.7)
Platelets: 773 10*3/uL — ABNORMAL HIGH (ref 150.0–400.0)
RBC: 4.79 Mil/uL (ref 3.87–5.11)
RDW: 14.4 % (ref 11.5–14.6)
WBC: 10.5 10*3/uL (ref 4.5–10.5)

## 2013-12-13 LAB — URINALYSIS, ROUTINE W REFLEX MICROSCOPIC
BILIRUBIN URINE: NEGATIVE
HGB URINE DIPSTICK: NEGATIVE
Ketones, ur: NEGATIVE
Leukocytes, UA: NEGATIVE
Nitrite: NEGATIVE
RBC / HPF: NONE SEEN (ref 0–?)
Specific Gravity, Urine: >=1.03 — AB (ref 1.000–1.030)
TOTAL PROTEIN, URINE-UPE24: NEGATIVE
Urine Glucose: NEGATIVE
Urobilinogen, UA: 0.2 (ref 0.0–1.0)
pH: 6 (ref 5.0–8.0)

## 2013-12-13 LAB — BASIC METABOLIC PANEL
BUN: 15 mg/dL (ref 6–23)
CHLORIDE: 101 meq/L (ref 96–112)
CO2: 26 meq/L (ref 19–32)
Calcium: 9.1 mg/dL (ref 8.4–10.5)
Creatinine, Ser: 0.8 mg/dL (ref 0.4–1.2)
GFR: 76.33 mL/min (ref >60.00)
Glucose, Bld: 132 mg/dL — ABNORMAL HIGH (ref 70–99)
Potassium: 3.7 mEq/L (ref 3.5–5.1)
Sodium: 137 mEq/L (ref 135–145)

## 2013-12-13 LAB — LIPID PANEL
CHOL/HDL RATIO: 3
Cholesterol: 178 mg/dL (ref 0–200)
HDL: 54.7 mg/dL (ref >39.00)
LDL CALC: 86 mg/dL (ref 0–99)
TRIGLYCERIDES: 186 mg/dL — AB (ref 0.0–149.0)
VLDL: 37.2 mg/dL (ref 0.0–40.0)

## 2013-12-13 LAB — TSH: TSH: 3 u[IU]/mL (ref 0.35–5.50)

## 2013-12-13 LAB — HEPATIC FUNCTION PANEL
ALT: 23 U/L (ref 0–35)
AST: 19 U/L (ref 0–37)
Albumin: 4.4 g/dL (ref 3.5–5.2)
Alkaline Phosphatase: 49 U/L (ref 39–117)
BILIRUBIN DIRECT: 0 mg/dL (ref 0.0–0.3)
TOTAL PROTEIN: 7.9 g/dL (ref 6.0–8.3)
Total Bilirubin: 0.5 mg/dL (ref 0.3–1.2)

## 2013-12-14 ENCOUNTER — Encounter: Payer: Self-pay | Admitting: Internal Medicine

## 2013-12-14 ENCOUNTER — Ambulatory Visit: Payer: 59

## 2013-12-14 ENCOUNTER — Ambulatory Visit (INDEPENDENT_AMBULATORY_CARE_PROVIDER_SITE_OTHER): Payer: 59 | Admitting: Internal Medicine

## 2013-12-14 VITALS — BP 140/70 | HR 99 | Temp 98.3°F | Ht 66.0 in | Wt 239.1 lb

## 2013-12-14 DIAGNOSIS — R7309 Other abnormal glucose: Secondary | ICD-10-CM

## 2013-12-14 DIAGNOSIS — R7302 Impaired glucose tolerance (oral): Secondary | ICD-10-CM

## 2013-12-14 DIAGNOSIS — Z0000 Encounter for general adult medical examination without abnormal findings: Secondary | ICD-10-CM

## 2013-12-14 DIAGNOSIS — D473 Essential (hemorrhagic) thrombocythemia: Secondary | ICD-10-CM

## 2013-12-14 DIAGNOSIS — M25569 Pain in unspecified knee: Secondary | ICD-10-CM

## 2013-12-14 DIAGNOSIS — M25561 Pain in right knee: Secondary | ICD-10-CM

## 2013-12-14 LAB — HEMOGLOBIN A1C: Hgb A1c MFr Bld: 6.2 % (ref 4.6–6.5)

## 2013-12-14 NOTE — Patient Instructions (Signed)
Please continue all other medications as before, and refills have been done if requested. Please have the pharmacy call with any other refills you may need. Please continue your efforts at being more active, low cholesterol diet, and weight control. You are otherwise up to date with prevention measures today.  Please return in 6 months for further lab work only (cbc, and Hgba1c) You will be contacted by phone if any changes need to be made immediately.  Otherwise, you will receive a letter about your results with an explanation, but please check with MyChart first.  Please keep your appointments with your specialists as you may have planned  Please remember to sign up for MyChart if you have not done so, as this will be important to you in the future with finding out test results, communicating by private email, and scheduling acute appointments online when needed.  Please consider an appt with Dr Smith/Sports Medicine in our office for the right knee if the pain persists or gets worse  Please return in 1 year for your yearly visit, or sooner if needed, with Lab testing done 3-5 days before

## 2013-12-14 NOTE — Assessment & Plan Note (Signed)

## 2013-12-14 NOTE — Progress Notes (Signed)
Subjective:    Patient ID: [name], female    DOB: 1966-09-02, 48 y.o.   MRN: [id number]  HPI  Here for wellness and f/u;  Overall doing ok;  Pt denies CP, worsening SOB, DOE, wheezing, orthopnea, PND, worsening LE edema, palpitations, dizziness or syncope.  Pt denies neurological change such as new headache, facial or extremity weakness.  Pt denies polydipsia, polyuria, or low sugar symptoms. Pt states overall good compliance with treatment and medications, good tolerability, and has been trying to follow lower cholesterol diet.  Pt denies worsening depressive symptoms, suicidal ideation or panic. No fever, night sweats, wt loss, loss of appetite, or other constitutional symptoms.  Pt states good ability with ADL's, has low fall risk, home safety reviewed and adequate, no other significant changes in hearing or vision, and only occasionally active with exercise, due to left plantar fasciitis, and more recently right knee pain with ambulation medially, mild, but keeps occuring, not sure she wants more done at this time as no swelling, giveaways, falls.  S/p right knee arthroscopy x 2 with meniscal tears. Past Medical History  Diagnosis Date  . FIBROIDS, UTERUS   . GERD   . HYPERTENSION   . Obesity, unspecified   . Other abnormal glucose   . THROMBOCYTHEMIA   . Diabetes mellitus     GESTATIONAL  . NSVD (normal spontaneous vaginal delivery)     X3  . Diverticulosis of colon (without mention of hemorrhage)    Past Surgical History  Procedure Laterality Date  . Knee arthroscopy Right 2007    Dr Zenovia Jarred  . Resectoscopic polypectomy    . Tubal ligation  2006    FILSHIE CLIP TECHNIQUE  . Hysteroscopy  06/19/2005    ASPIRATION OF LEFT PARATUBAL CYST    reports that she has never smoked. She has never used smokeless tobacco. She reports that she drinks alcohol. She reports that she does not use illicit drugs. family history includes Breast cancer in her mother; Diabetes in her father;  Hypertension in her father. There is no history of Colon cancer, Stomach cancer, or Rectal cancer. No Known Allergies Current Outpatient Prescriptions on File Prior to Visit  Medication Sig Dispense Refill  . amLODipine (NORVASC) 5 MG tablet Take 1 tablet (5 mg total) by mouth daily.  90 tablet  3  . aspirin 81 MG tablet Take 81 mg by mouth daily.        . benazepril (LOTENSIN) 10 MG tablet Take 1 tablet (10 mg total) by mouth daily.  90 tablet  3  . calcium carbonate (OS-CAL) 600 MG TABS Take 600 mg by mouth 2 (two) times daily with a meal.      . hyoscyamine (LEVSIN SL) 0.125 MG SL tablet Place 1 tablet (0.125 mg total) under the tongue every 4 (four) hours as needed.  30 tablet  1  . ibuprofen (ADVIL,MOTRIN) 200 MG tablet Take 200 mg by mouth every 6 (six) hours as needed for pain.      Marland Kitchen loratadine (ALLERGY) 10 MG tablet Take 10 mg by mouth daily.      Marland Kitchen nystatin-triamcinolone (MYCOLOG II) cream Apply topically 3 (three) times daily.  30 g  2  . pantoprazole (PROTONIX) 40 MG tablet Take 40 mg by mouth daily as needed.       No current facility-administered medications on file prior to visit.   Review of Systems Constitutional: Negative for diaphoresis, activity change, appetite change or unexpected weight change.  HENT: Negative for  hearing loss, ear pain, facial swelling, mouth sores and neck stiffness.   Eyes: Negative for pain, redness and visual disturbance.  Respiratory: Negative for shortness of breath and wheezing.   Cardiovascular: Negative for chest pain and palpitations.  Gastrointestinal: Negative for diarrhea, blood in stool, abdominal distention or other pain Genitourinary: Negative for hematuria, flank pain or change in urine volume.  Musculoskeletal: Negative for myalgias and joint swelling.  Skin: Negative for color change and wound.  Neurological: Negative for syncope and numbness. other than noted Hematological: Negative for adenopathy.  Psychiatric/Behavioral:  Negative for hallucinations, self-injury, decreased concentration and agitation.      Objective:   Physical Exam BP 140/70  Pulse 99  Temp(Src) 98.3 F (36.8 C) (Oral)  Ht 5\' 6"  (1.676 m)  Wt 239 lb 2 oz (108.466 kg)  BMI 38.61 kg/m2  SpO2 96% VS noted,  Constitutional: Pt is oriented to person, place, and time. Appears well-developed and well-nourished.  Head: Normocephalic and atraumatic.  Right Ear: External ear normal.  Left Ear: External ear normal.  Nose: Nose normal.  Mouth/Throat: Oropharynx is clear and moist.  Eyes: Conjunctivae and EOM are normal. Pupils are equal, round, and reactive to light.  Neck: Normal range of motion. Neck supple. No JVD present. No tracheal deviation present.  Cardiovascular: Normal rate, regular rhythm, normal heart sounds and intact distal pulses.   Pulmonary/Chest: Effort normal and breath sounds normal.  Abdominal: Soft. Bowel sounds are normal. There is no tenderness. No HSM  Musculoskeletal: Normal range of motion. Exhibits no edema.  Lymphadenopathy:  Has no cervical adenopathy.  Neurological: Pt is alert and oriented to person, place, and time. Pt has normal reflexes. No cranial nerve deficit.  Skin: Skin is warm and dry. No rash noted.  Psychiatric:  Has  normal mood and affect. Behavior is normal.  Right knee wihtout effusion, FROM, NT except very mild left joint line    Assessment & Plan:

## 2013-12-14 NOTE — Progress Notes (Signed)
Pre-visit discussion using our clinic review tool. No additional management support is needed unless otherwise documented below in the visit note.  

## 2013-12-16 DIAGNOSIS — M25561 Pain in right knee: Secondary | ICD-10-CM | POA: Insufficient documentation

## 2013-12-16 NOTE — Assessment & Plan Note (Signed)
Asympt, to f/u any worsening symptoms or concerns Lab Results  Component Value Date   HGBA1C 6.2 12/14/2013

## 2013-12-16 NOTE — Assessment & Plan Note (Addendum)
Improved,  to f/u any worsening symptoms or concerns 

## 2013-12-16 NOTE — Assessment & Plan Note (Signed)
Unclear etiology, ? DJD, for wt loss, exercise, overall mild , tylenol prn, consider sport med referral - declines at this time

## 2014-01-25 ENCOUNTER — Other Ambulatory Visit: Payer: Self-pay | Admitting: Internal Medicine

## 2014-08-23 ENCOUNTER — Telehealth: Payer: Self-pay | Admitting: Gastroenterology

## 2014-08-23 NOTE — Telephone Encounter (Signed)
Patient calling to see if the Hyoscyamine will help her if she has rectal bleeding. Told patient this is for abdominal cramping, pain. She states she is having her period. She noticed more blood in toilet than usual. She states she is having some bright, red blood from rectum. Offered OV but she wants to wait another day since last year she called right away then the bleeding stopped before she came to [place]. She will call back for increased bleeding or if it continues tomorrow.

## 2014-08-31 ENCOUNTER — Encounter: Payer: Self-pay | Admitting: Gynecology

## 2014-09-04 ENCOUNTER — Encounter: Payer: Self-pay | Admitting: Internal Medicine

## 2014-09-07 ENCOUNTER — Ambulatory Visit (INDEPENDENT_AMBULATORY_CARE_PROVIDER_SITE_OTHER): Payer: 59 | Admitting: Gynecology

## 2014-09-07 ENCOUNTER — Encounter: Payer: Self-pay | Admitting: Gynecology

## 2014-09-07 VITALS — BP 146/90 | Ht 65.5 in | Wt 241.0 lb

## 2014-09-07 DIAGNOSIS — R6882 Decreased libido: Secondary | ICD-10-CM

## 2014-09-07 DIAGNOSIS — Z01419 Encounter for gynecological examination (general) (routine) without abnormal findings: Secondary | ICD-10-CM

## 2014-09-07 DIAGNOSIS — Z23 Encounter for immunization: Secondary | ICD-10-CM

## 2014-09-07 MED ORDER — FLIBANSERIN 100 MG PO TABS: 100.0000 mg | ORAL_TABLET | Freq: Every day | ORAL | Status: DC

## 2014-09-07 NOTE — Patient Instructions (Signed)
Addyi [facility] Program                                                                Patient Provider Agreement Form  Healthcare Provider: -Alcohol use is contraindicated in women taking Addyi -Addyi and alcohol and tract and increase the risk of severe hypotension and syncope -Patient - Provider Agreement is being placed in the patient's electronic medical record, outlining these risk as well as the importance of abstaining from alcohol  Patient: Patient understands that she was not drink alcohol while taking Addyi. Drinking alcohol during treatment with Addyi has been shown to increase the risk of severe low blood pressure and fainting (loss of consciousness).  -Patient understands that if she feels lightheaded or dizziness she will lie down immediately and seek medical attention if the symptoms do not go away -Patient understands that if she faints (any  loss of consciousness), that she will contact her medical provider as soon as possible -Patient understands that she should take Addyi at bedtime -Patient understands that if she misses a dose she will skip to miss dose. She will take her next dose the following day at bedtime -Patient fully understands these instructions have been provided by me as her provider.

## 2014-09-07 NOTE — Addendum Note (Signed)
Addended by: Thurnell Garbe A on: 09/07/2014 03:47 PM   Modules accepted: Orders, SmartSet

## 2014-09-07 NOTE — Progress Notes (Signed)
[name] 05-07-1966 [id number]   History:    48 y.o.  for annual gyn exam who is been under a lot of stress today in her blood pressure was found to be 146/90 which she came to the office. She is being followed by her primary physician Dr. Cathlean Wong who is treating her for hypertension. She stated she took her medication this morning (Norvasc 5 mg).She is still having menstrual cycles. She is overweight.she is having no vasomotor symptoms. She is complaining of decreased libido.Review of her record indicated she has had history in the past of mild essential thrombocytosis and had been followed by Dr. Murriel Wong who had recommend she be on baby aspirin. She last saw him back in 2011 and was released her from her care. She had been tested in the past for the JAK-2 mutation and was negative. Patient's had previous tubal sterilization procedure. 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 Patient has always had normal Pap smears.patient's mother has had history of breast cancer.patient requesting flu vaccine today.  Patient had colonoscopy last year diverticulosis was diagnosed.  Past medical history,surgical history, family history and social history were all reviewed and documented in the EPIC chart.  Gynecologic History Patient's last menstrual period was 08/19/2014. Contraception: tubal ligation Last Pap:2014. Results were: normal Last mammogram: 2015. Results were: normal  Obstetric History OB History  Gravida Para Term Preterm AB SAB TAB Ectopic Multiple Living  3 3 3       3     # Outcome Date GA Lbr Len/2nd Weight Sex Delivery Anes PTL Lv  3 Term     M Vag-Spont  N Y  2 Term     F Vag-Spont  N Y  1 Term     F Vag-Spont  N Y       ROS: A ROS was performed and pertinent positives and negatives are included in the history.  GENERAL: No fevers or chills. HEENT: No change in vision, no earache, sore throat or sinus congestion. NECK: No  pain or stiffness. CARDIOVASCULAR: No chest pain or pressure. No palpitations. PULMONARY: No shortness of breath, cough or wheeze. GASTROINTESTINAL: No abdominal pain, nausea, vomiting or diarrhea, melena or bright red blood per rectum. GENITOURINARY: No urinary frequency, urgency, hesitancy or dysuria. MUSCULOSKELETAL: No joint or muscle pain, no back pain, no recent trauma. DERMATOLOGIC: No rash, no itching, no lesions. ENDOCRINE: No polyuria, polydipsia, no heat or cold intolerance. No recent change in weight. HEMATOLOGICAL: No anemia or easy bruising or bleeding. NEUROLOGIC: No headache, seizures, numbness, tingling or weakness. PSYCHIATRIC: No depression, no loss of interest in normal activity or change in sleep pattern.     Exam: chaperone present  BP 146/90 mmHg  Ht 5' 5.5" (1.664 m)  Wt 241 lb (109.317 kg)  BMI 39.48 kg/m2  LMP 08/19/2014  Body mass index is 39.48 kg/(m^2).  General appearance : Well developed well nourished female. No acute distress HEENT: Neck supple, trachea midline, no carotid bruits, no thyroidmegaly Lungs: Clear to auscultation, no rhonchi or wheezes, or rib retractions  Heart: Regular rate and rhythm, no murmurs or gallops Breast:Examined in sitting and supine position were symmetrical in appearance, no palpable masses or tenderness,  no skin retraction, no nipple inversion, no nipple discharge, no skin discoloration, no axillary or supraclavicular lymphadenopathy Abdomen: no palpable masses or tenderness, no rebound or guarding Extremities: no edema or skin discoloration or tenderness  Pelvic:  Bartholin, Urethra, Skene Glands: Within  normal limits             Vagina: No gross lesions or discharge  Cervix: No gross lesions or discharge  Uterus  anteverted, normal size, shape and consistency, non-tender and mobile  Adnexa  Without masses or tenderness  Anus and perineum  normal   Rectovaginal  normal sphincter tone without palpated masses or tenderness              Hemoccult cards will be provided     Assessment/Plan:  48 y.o. female for annual exam who is overweight has history of hypertension. She been complaining of decreased libido.she will be prescribed Addyi to increase her sexual desire. She will be prescribed 100 mg at bedtime. Consent form was provided which patient has a copy. She knows not to take alcohol with the medication and to take at bedtime. Potential risk are  dizziness, headaches, hypertension were discussed.patient's PCP has been doing her blood work. Patient received the flu vaccine today. Pap smear not done today according to the new guidelines. Patient was reminded of the importance of monthly self breast examination.repeat blood pressure 132/72.   Terrance Mass MD, 2:59 PM 09/07/2014

## 2014-12-08 ENCOUNTER — Other Ambulatory Visit: Payer: Self-pay | Admitting: Internal Medicine

## 2014-12-08 ENCOUNTER — Telehealth: Payer: Self-pay | Admitting: Internal Medicine

## 2014-12-08 MED ORDER — AMLODIPINE BESYLATE 5 MG PO TABS: 5.0000 mg | ORAL_TABLET | Freq: Every day | ORAL | Status: DC

## 2014-12-08 MED ORDER — BENAZEPRIL HCL 10 MG PO TABS: 10.0000 mg | ORAL_TABLET | Freq: Every day | ORAL | Status: DC

## 2014-12-08 NOTE — Telephone Encounter (Signed)
Pt called in requesting refill on her   Amlodipine  Benazepril    Sent to [facility] long outpatient pharmacy

## 2014-12-08 NOTE — Telephone Encounter (Signed)
Sent refills to [facility].Marland KitchenJohny Chess

## 2014-12-15 ENCOUNTER — Ambulatory Visit (INDEPENDENT_AMBULATORY_CARE_PROVIDER_SITE_OTHER): Payer: 59 | Admitting: Internal Medicine

## 2014-12-15 ENCOUNTER — Other Ambulatory Visit: Payer: 59

## 2014-12-15 ENCOUNTER — Other Ambulatory Visit (INDEPENDENT_AMBULATORY_CARE_PROVIDER_SITE_OTHER): Payer: 59

## 2014-12-15 VITALS — BP 150/90 | HR 91 | Temp 98.1°F | Ht 66.0 in | Wt 245.0 lb

## 2014-12-15 DIAGNOSIS — R7302 Impaired glucose tolerance (oral): Secondary | ICD-10-CM

## 2014-12-15 DIAGNOSIS — I10 Essential (primary) hypertension: Secondary | ICD-10-CM

## 2014-12-15 DIAGNOSIS — H60392 Other infective otitis externa, left ear: Secondary | ICD-10-CM

## 2014-12-15 DIAGNOSIS — Z0000 Encounter for general adult medical examination without abnormal findings: Secondary | ICD-10-CM

## 2014-12-15 LAB — BASIC METABOLIC PANEL
BUN: 15 mg/dL (ref 6–23)
CALCIUM: 9.6 mg/dL (ref 8.4–10.5)
CO2: 28 meq/L (ref 19–32)
Chloride: 102 mEq/L (ref 96–112)
Creatinine, Ser: 0.84 mg/dL (ref 0.40–1.20)
GFR: 76.02 mL/min (ref >60.00)
Glucose, Bld: 108 mg/dL — ABNORMAL HIGH (ref 70–99)
POTASSIUM: 4.4 meq/L (ref 3.5–5.1)
Sodium: 136 mEq/L (ref 135–145)

## 2014-12-15 LAB — URINALYSIS, ROUTINE W REFLEX MICROSCOPIC
Bilirubin Urine: NEGATIVE
Hgb urine dipstick: NEGATIVE
KETONES UR: NEGATIVE
LEUKOCYTES UA: NEGATIVE
Nitrite: NEGATIVE
Specific Gravity, Urine: 1.015 (ref 1.000–1.030)
Total Protein, Urine: NEGATIVE
Urine Glucose: NEGATIVE
Urobilinogen, UA: 0.2 (ref 0.0–1.0)
pH: 5.5 (ref 5.0–8.0)

## 2014-12-15 LAB — CBC WITH DIFFERENTIAL/PLATELET
Basophils Absolute: 0.1 10*3/uL (ref 0.0–0.1)
Basophils Relative: 0.7 % (ref 0.0–3.0)
Eosinophils Absolute: 0.4 10*3/uL (ref 0.0–0.7)
Eosinophils Relative: 4.6 % (ref 0.0–5.0)
HEMATOCRIT: 38.4 % (ref 36.0–46.0)
HEMOGLOBIN: 12.5 g/dL (ref 12.0–15.0)
LYMPHS ABS: 2.1 10*3/uL (ref 0.7–4.0)
Lymphocytes Relative: 23.9 % (ref 12.0–46.0)
MCHC: 32.7 g/dL (ref 30.0–36.0)
MCV: 77.6 fl — ABNORMAL LOW (ref 78.0–100.0)
MONO ABS: 0.7 10*3/uL (ref 0.1–1.0)
MONOS PCT: 8.6 % (ref 3.0–12.0)
NEUTROS ABS: 5.4 10*3/uL (ref 1.4–7.7)
Neutrophils Relative %: 62.2 % (ref 43.0–77.0)
Platelets: 732 10*3/uL — ABNORMAL HIGH (ref 150.0–400.0)
RBC: 4.94 Mil/uL (ref 3.87–5.11)
RDW: 15.7 % — ABNORMAL HIGH (ref 11.5–15.5)
WBC: 8.7 10*3/uL (ref 4.0–10.5)

## 2014-12-15 LAB — HEPATIC FUNCTION PANEL
ALBUMIN: 4.3 g/dL (ref 3.5–5.2)
ALT: 19 U/L (ref 0–35)
AST: 15 U/L (ref 0–37)
Alkaline Phosphatase: 56 U/L (ref 39–117)
Bilirubin, Direct: 0.1 mg/dL (ref 0.0–0.3)
TOTAL PROTEIN: 7.9 g/dL (ref 6.0–8.3)
Total Bilirubin: 0.3 mg/dL (ref 0.2–1.2)

## 2014-12-15 LAB — LIPID PANEL
CHOL/HDL RATIO: 3
CHOLESTEROL: 165 mg/dL (ref 0–200)
HDL: 56 mg/dL (ref >39.00)
LDL CALC: 77 mg/dL (ref 0–99)
NonHDL: 109
Triglycerides: 159 mg/dL — ABNORMAL HIGH (ref 0.0–149.0)
VLDL: 31.8 mg/dL (ref 0.0–40.0)

## 2014-12-15 LAB — HEMOGLOBIN A1C: Hgb A1c MFr Bld: 6.7 % — ABNORMAL HIGH (ref 4.6–6.5)

## 2014-12-15 LAB — TSH: TSH: 1.99 u[IU]/mL (ref 0.35–4.50)

## 2014-12-15 NOTE — Patient Instructions (Addendum)
Your EKG was OK today  Please continue all other medications as before, and refills have been done if requested.  Please let us know if you want the BP pills made into the generic lotrel as you mentioned  Please have the pharmacy call with any other refills you may need.  Please continue your efforts at being more active, low cholesterol diet, and weight control.  You are otherwise up to date with prevention measures today.  Please keep your appointments with your specialists as you may have planned  You will be contacted regarding the referral for: ENT for the left ear  Please go to the LAB in the Basement (turn left off the elevator) for the tests to be done today  You will be contacted by phone if any changes need to be made immediately.  Otherwise, you will receive a letter about your results with an explanation, but please check with MyChart first.  Please remember to sign up for MyChart if you have not done so, as this will be important to you in the future with finding out test results, communicating by private email, and scheduling acute appointments online when needed.  Please return in 1 year for your yearly visit, or sooner if needed, with Lab testing done 3-5 days before

## 2014-12-15 NOTE — Assessment & Plan Note (Signed)
Declines change in med, OK BP at home per pt

## 2014-12-15 NOTE — Assessment & Plan Note (Signed)
Also for a1c, asympt

## 2014-12-15 NOTE — Assessment & Plan Note (Addendum)

## 2014-12-15 NOTE — Addendum Note (Signed)
Addended by: Biagio Borg on: 12/15/2014 01:01 PM   Modules accepted: Miquel Dunn

## 2014-12-15 NOTE — Progress Notes (Signed)
Pre visit review using our clinic review tool, if applicable. No additional management support is needed unless otherwise documented below in the visit note. 

## 2014-12-15 NOTE — Progress Notes (Signed)
Subjective:    Patient ID: [name], female    DOB: 21-Jul-1966, 49 y.o.   MRN: [id number]  HPI  Here for wellness and f/u;  Overall doing ok;  Pt denies CP, worsening SOB, DOE, wheezing, orthopnea, PND, worsening LE edema, palpitations, dizziness or syncope.  Pt denies neurological change such as new headache, facial or extremity weakness.  Pt denies polydipsia, polyuria, or low sugar symptoms. Pt states overall good compliance with treatment and medications, good tolerability, and has been trying to follow lower cholesterol diet.  Pt denies worsening depressive symptoms, suicidal ideation or panic. No fever, night sweats, wt loss, loss of appetite, or other constitutional symptoms.  Pt states good ability with ADL's, has low fall risk, home safety reviewed and adequate, no other significant changes in hearing or vision, and only occasionally active with exercise.  Mother died with hemorraghic stroke last yr.  No current complaints.  BP at home < 140/90.  Akss for ENT for left ear due to recurring otitis ext Past Medical History  Diagnosis Date  . FIBROIDS, UTERUS   . GERD   . HYPERTENSION   . Obesity, unspecified   . Other abnormal glucose   . THROMBOCYTHEMIA   . Diabetes mellitus     GESTATIONAL  . NSVD (normal spontaneous vaginal delivery)     X3  . Diverticulosis of colon (without mention of hemorrhage)    Past Surgical History  Procedure Laterality Date  . Knee arthroscopy Right 2007    Dr Zenovia Jarred  . Resectoscopic polypectomy    . Tubal ligation  2006    FILSHIE CLIP TECHNIQUE  . Hysteroscopy  06/19/2005    ASPIRATION OF LEFT PARATUBAL CYST    reports that she has never smoked. She has never used smokeless tobacco. She reports that she drinks alcohol. She reports that she does not use illicit drugs. family history includes Breast cancer in her mother; Diabetes in her father; Hypertension in her father. There is no history of Colon cancer, Stomach cancer, or Rectal cancer. No  Known Allergies Current Outpatient Prescriptions on File Prior to Visit  Medication Sig Dispense Refill  . amLODipine (NORVASC) 5 MG tablet Take 1 tablet (5 mg total) by mouth daily. 90 tablet 0  . aspirin 81 MG tablet Take 81 mg by mouth daily.      . benazepril (LOTENSIN) 10 MG tablet Take 1 tablet (10 mg total) by mouth daily. 90 tablet 0  . calcium carbonate (OS-CAL) 600 MG TABS Take 600 mg by mouth 2 (two) times daily with a meal.    . ibuprofen (ADVIL,MOTRIN) 200 MG tablet Take 200 mg by mouth every 6 (six) hours as needed for pain.    Marland Kitchen loratadine (ALLERGY) 10 MG tablet Take 10 mg by mouth daily.    Marland Kitchen nystatin-triamcinolone (MYCOLOG II) cream Apply topically 3 (three) times daily. 30 g 2  . pantoprazole (PROTONIX) 40 MG tablet TAKE 1 TABLET BY MOUTH ONCE DAILY 90 tablet 3   No current facility-administered medications on file prior to visit.   Review of Systems Constitutional: Negative for increased diaphoresis, other activity, appetite or other siginficant weight change  HENT: Negative for worsening hearing loss, ear pain, facial swelling, mouth sores and neck stiffness.   Mild left ear canal flakiness/swelling and d/c Eyes: Negative for other worsening pain, redness or visual disturbance.  Respiratory: Negative for shortness of breath and wheezing.   Cardiovascular: Negative for chest pain and palpitations.  Gastrointestinal: Negative for diarrhea, blood in  stool, abdominal distention or other pain Genitourinary: Negative for hematuria, flank pain or change in urine volume.  Musculoskeletal: Negative for myalgias or other joint complaints.  Skin: Negative for color change and wound.  Neurological: Negative for syncope and numbness. other than noted Hematological: Negative for adenopathy. or other swelling Psychiatric/Behavioral: Negative for hallucinations, self-injury, decreased concentration or other worsening agitation.      Objective:   Physical Exam BP 150/90 mmHg   Pulse 91  Temp(Src) 98.1 F (36.7 C) (Oral)  Ht 5\' 6"  (1.676 m)  Wt 245 lb (111.131 kg)  BMI 39.56 kg/m2 VS noted,  Constitutional: Pt is oriented to person, place, and time. Appears well-developed and well-nourished.  Head: Normocephalic and atraumatic.  Right Ear: External ear normal.  Left Ear: External ear normal.  Nose: Nose normal.  Mouth/Throat: Oropharynx is clear and moist.  Eyes: Conjunctivae and EOM are normal. Pupils are equal, round, and reactive to light.  Neck: Normal range of motion. Neck supple. No JVD present. No tracheal deviation present.  Cardiovascular: Normal rate, regular rhythm, normal heart sounds and intact distal pulses.   Pulmonary/Chest: Effort normal and breath sounds without rales or wheezing  Abdominal: Soft. Bowel sounds are normal. NT. No HSM  Musculoskeletal: Normal range of motion. Exhibits no edema.  Lymphadenopathy:  Has no cervical adenopathy.  Neurological: Pt is alert and oriented to person, place, and time. Pt has normal reflexes. No cranial nerve deficit. Motor grossly intact Skin: Skin is warm and dry. No rash noted.  Psychiatric:  Has mild nervous mood and affect. Behavior is normal.     Assessment & Plan:

## 2015-03-12 ENCOUNTER — Other Ambulatory Visit: Payer: Self-pay | Admitting: Internal Medicine

## 2015-03-26 ENCOUNTER — Other Ambulatory Visit: Payer: Self-pay | Admitting: Internal Medicine

## 2015-07-03 ENCOUNTER — Other Ambulatory Visit: Payer: Self-pay | Admitting: Gynecology

## 2015-08-30 ENCOUNTER — Encounter: Payer: Self-pay | Admitting: Gynecology

## 2015-09-10 ENCOUNTER — Encounter: Payer: Self-pay | Admitting: Gynecology

## 2015-09-10 ENCOUNTER — Other Ambulatory Visit (HOSPITAL_COMMUNITY)
Admission: RE | Admit: 2015-09-10 | Discharge: 2015-09-10 | Disposition: A | Discharge status: Home / Self Care | Payer: 59 | Source: Ambulatory Visit | Attending: Gynecology | Admitting: Gynecology

## 2015-09-10 ENCOUNTER — Ambulatory Visit (INDEPENDENT_AMBULATORY_CARE_PROVIDER_SITE_OTHER): Payer: 59 | Admitting: Gynecology

## 2015-09-10 VITALS — BP 136/90 | Ht 66.0 in | Wt 236.0 lb

## 2015-09-10 DIAGNOSIS — Z01411 Encounter for gynecological examination (general) (routine) with abnormal findings: Secondary | ICD-10-CM | POA: Insufficient documentation

## 2015-09-10 DIAGNOSIS — N938 Other specified abnormal uterine and vaginal bleeding: Secondary | ICD-10-CM

## 2015-09-10 DIAGNOSIS — Z01419 Encounter for gynecological examination (general) (routine) without abnormal findings: Secondary | ICD-10-CM

## 2015-09-10 DIAGNOSIS — N951 Menopausal and female climacteric states: Secondary | ICD-10-CM | POA: Diagnosis not present

## 2015-09-10 DIAGNOSIS — Z23 Encounter for immunization: Secondary | ICD-10-CM

## 2015-09-10 NOTE — Progress Notes (Signed)
[name] October 05, 1966 [id number]   History:    49 y.o.  for annual gyn exam complaining of irregular menstrual cycles for the past 2 months she has had breakthrough bleeding. Her husband passed away several months ago and she has good family support and is doing well and emotional-wise. Patient scheduled for right knee replacement in December.She is being followed by her primary physician Dr. Cathlean Wong who is treating her for hypertension. She stated she took her medication this morning (Norvasc 5 mg).She is still having menstrual cycles. She is overweight.she is having no vasomotor symptoms. Review of her record indicated she has had history in the past of mild essential thrombocytosis and had been followed by Dr. Murriel Wong who had recommend she be on baby aspirin. She last saw him back in 2011 and was released her from her care. She had been tested in the past for the JAK-2 mutation and was negative. Patient with past history resectoscopic myomectomy for prolapsing myoma several years ago. Patient with no previous history of any abnormal Pap smear. Patient's mother with history of breast cancer. Patient requesting flu vaccine today.   Past medical history,surgical history, family history and social history were all reviewed and documented in the EPIC chart.  Gynecologic History Patient's last menstrual period was 09/02/2015. Contraception: tubal ligation Last Pap: 2014. Results were: normal Last mammogram: 2016. Results were: normal  Obstetric History OB History  Gravida Para Term Preterm AB SAB TAB Ectopic Multiple Living  3 3 3       3     # Outcome Date GA Lbr Len/2nd Weight Sex Delivery Anes PTL Lv  3 Term     M Vag-Spont  N Y  2 Term     F Vag-Spont  N Y  1 Term     F Vag-Spont  N Y       ROS: A ROS was performed and pertinent positives and negatives are included in the history.  GENERAL: No fevers or chills. HEENT: No change in vision, no earache, sore throat or  sinus congestion. NECK: No pain or stiffness. CARDIOVASCULAR: No chest pain or pressure. No palpitations. PULMONARY: No shortness of breath, cough or wheeze. GASTROINTESTINAL: No abdominal pain, nausea, vomiting or diarrhea, melena or bright red blood per rectum. GENITOURINARY: No urinary frequency, urgency, hesitancy or dysuria. MUSCULOSKELETAL: No joint or muscle pain, no back pain, no recent trauma. DERMATOLOGIC: No rash, no itching, no lesions. ENDOCRINE: No polyuria, polydipsia, no heat or cold intolerance. No recent change in weight. HEMATOLOGICAL: No anemia or easy bruising or bleeding. NEUROLOGIC: No headache, seizures, numbness, tingling or weakness. PSYCHIATRIC: No depression, no loss of interest in normal activity or change in sleep pattern.     Exam: chaperone present  BP 136/90 mmHg  Ht 5\' 6"  (1.676 m)  Wt 236 lb (107.049 kg)  BMI 38.11 kg/m2  LMP 09/02/2015  Body mass index is 38.11 kg/(m^2).  General appearance : Well developed well nourished female. No acute distress HEENT: Eyes: no retinal hemorrhage or exudates,  Neck supple, trachea midline, no carotid bruits, no thyroidmegaly Lungs: Clear to auscultation, no rhonchi or wheezes, or rib retractions  Heart: Regular rate and rhythm, no murmurs or gallops Breast:Examined in sitting and supine position were symmetrical in appearance, no palpable masses or tenderness,  no skin retraction, no nipple inversion, no nipple discharge, no skin discoloration, no axillary or supraclavicular lymphadenopathy Abdomen: no palpable masses or tenderness, no rebound or guarding Extremities: no edema or skin  discoloration or tenderness  Pelvic:  Bartholin, Urethra, Skene Glands: Within normal limits             Vagina: No gross lesions or discharge  Cervix: No gross lesions or discharge  Uterus  anteverted, normal size, shape and consistency, non-tender and mobile  Adnexa  Without masses or tenderness  Anus and perineum  normal    Rectovaginal  normal sphincter tone without palpated masses or tenderness             Hemoccult colonoscopy 2014 we will provide her with fecal Hemoccult cards when she returns back to the office for sonohysterogram     Assessment/Plan:  49 y.o. female for annual exam perimenopausal with dysfunctional uterine bleeding. 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 Patient was counseled today and her cervix was cleansed with Betadine solution and a sterile Pipelle was introduced into the uterine cavity and endometrial biopsy was obtained. Tissue was submitted for histological evaluation. A Pap smear was obtained today. Patient will return back to the office in 1-2 weeks for sonohysterogram to reassess intrauterine cavity make sure there has not been a recurrence of submucous myoma or polyps contributing to her dysfunctional uterine bleeding over the course the past 2 months. Patient's PCP has been doing her blood work. Patient received the flu vaccine today. Patient will be provided with the Hemoccult fecal occult blood testing cards when she returned back to the office for the sonohysterogram. Pap smear not indicated this year.   Terrance Mass MD, 1:07 PM 09/10/2015

## 2015-09-12 ENCOUNTER — Other Ambulatory Visit: Payer: Self-pay | Admitting: Orthopedic Surgery

## 2015-09-12 LAB — CYTOLOGY - PAP

## 2015-09-19 ENCOUNTER — Other Ambulatory Visit: Payer: Self-pay | Admitting: Gynecology

## 2015-09-19 DIAGNOSIS — N939 Abnormal uterine and vaginal bleeding, unspecified: Secondary | ICD-10-CM

## 2015-09-26 ENCOUNTER — Telehealth: Payer: Self-pay | Admitting: Gynecology

## 2015-09-26 NOTE — Telephone Encounter (Signed)
09/26/15-I LM VM home phone per DPR that pt [facility] ins states she has met her $4,000 oop for the year so the sonohysterogram should be covered at 100%.WL --Per Delcie Roch at [facility]

## 2015-10-01 ENCOUNTER — Encounter (HOSPITAL_COMMUNITY): Payer: Self-pay

## 2015-10-01 ENCOUNTER — Encounter (HOSPITAL_COMMUNITY)
Admission: RE | Admit: 2015-10-01 | Discharge: 2015-10-01 | Disposition: A | Discharge status: Home / Self Care | Payer: 59 | Source: Ambulatory Visit | Attending: Orthopedic Surgery | Admitting: Orthopedic Surgery

## 2015-10-01 ENCOUNTER — Ambulatory Visit (HOSPITAL_COMMUNITY)
Admission: RE | Admit: 2015-10-01 | Discharge: 2015-10-01 | Disposition: A | Discharge status: Home / Self Care | Payer: 59 | Source: Ambulatory Visit | Attending: Orthopedic Surgery | Admitting: Orthopedic Surgery

## 2015-10-01 DIAGNOSIS — I10 Essential (primary) hypertension: Secondary | ICD-10-CM | POA: Insufficient documentation

## 2015-10-01 DIAGNOSIS — Z01812 Encounter for preprocedural laboratory examination: Secondary | ICD-10-CM | POA: Insufficient documentation

## 2015-10-01 DIAGNOSIS — E119 Type 2 diabetes mellitus without complications: Secondary | ICD-10-CM | POA: Insufficient documentation

## 2015-10-01 DIAGNOSIS — E669 Obesity, unspecified: Secondary | ICD-10-CM | POA: Diagnosis not present

## 2015-10-01 DIAGNOSIS — Z01818 Encounter for other preprocedural examination: Secondary | ICD-10-CM | POA: Diagnosis present

## 2015-10-01 HISTORY — DX: Unspecified osteoarthritis, unspecified site: M19.90

## 2015-10-01 LAB — CBC WITH DIFFERENTIAL/PLATELET
BASOS ABS: 0 10*3/uL (ref 0.0–0.1)
Basophils Relative: 0 %
EOS PCT: 4 %
Eosinophils Absolute: 0.4 10*3/uL (ref 0.0–0.7)
HEMATOCRIT: 38.3 % (ref 36.0–46.0)
HEMOGLOBIN: 11.9 g/dL — AB (ref 12.0–15.0)
LYMPHS PCT: 24 %
Lymphs Abs: 2.6 10*3/uL (ref 0.7–4.0)
MCH: 23.6 pg — ABNORMAL LOW (ref 26.0–34.0)
MCHC: 31.1 g/dL (ref 30.0–36.0)
MCV: 75.8 fL — AB (ref 78.0–100.0)
Monocytes Absolute: 0.7 10*3/uL (ref 0.1–1.0)
Monocytes Relative: 7 %
NEUTROS ABS: 6.9 10*3/uL (ref 1.7–7.7)
NEUTROS PCT: 65 %
PLATELETS: 815 10*3/uL — AB (ref 150–400)
RBC: 5.05 MIL/uL (ref 3.87–5.11)
RDW: 16.9 % — ABNORMAL HIGH (ref 11.5–15.5)
WBC: 10.8 10*3/uL — AB (ref 4.0–10.5)

## 2015-10-01 LAB — URINALYSIS, ROUTINE W REFLEX MICROSCOPIC
GLUCOSE, UA: NEGATIVE mg/dL
HGB URINE DIPSTICK: NEGATIVE
KETONES UR: 15 mg/dL — AB
Leukocytes, UA: NEGATIVE
Nitrite: NEGATIVE
PROTEIN: 30 mg/dL — AB
Specific Gravity, Urine: 1.028 (ref 1.005–1.030)
pH: 6 (ref 5.0–8.0)

## 2015-10-01 LAB — BASIC METABOLIC PANEL
ANION GAP: 10 (ref 5–15)
BUN: 12 mg/dL (ref 6–20)
CO2: 23 mmol/L (ref 22–32)
Calcium: 9.2 mg/dL (ref 8.9–10.3)
Chloride: 102 mmol/L (ref 101–111)
Creatinine, Ser: 0.82 mg/dL (ref 0.44–1.00)
Glucose, Bld: 91 mg/dL (ref 65–99)
POTASSIUM: 3.6 mmol/L (ref 3.5–5.1)
SODIUM: 135 mmol/L (ref 135–145)

## 2015-10-01 LAB — SURGICAL PCR SCREEN
MRSA, PCR: NEGATIVE
STAPHYLOCOCCUS AUREUS: NEGATIVE

## 2015-10-01 LAB — URINE MICROSCOPIC-ADD ON: RBC / HPF: NONE SEEN RBC/hpf (ref 0–5)

## 2015-10-01 LAB — PROTIME-INR
INR: 1.07 (ref 0.00–1.49)
Prothrombin Time: 14.1 seconds (ref 11.6–15.2)

## 2015-10-01 LAB — APTT: APTT: 30 s (ref 24–37)

## 2015-10-01 LAB — ABO/RH: ABO/RH(D): O POS

## 2015-10-01 LAB — TYPE AND SCREEN
ABO/RH(D): O POS
ANTIBODY SCREEN: NEGATIVE

## 2015-10-01 LAB — HCG, SERUM, QUALITATIVE: PREG SERUM: NEGATIVE

## 2015-10-01 NOTE — Pre-Procedure Instructions (Signed)
[name]  10/01/2015     Your procedure is scheduled on : Wednesday October 10, 2015 at 12:45 PM.  Report to [facility] Admitting at 10:45 AM.  Call this number if you have problems the morning of surgery: [phone]    Remember:  Do not eat food or drink liquids after midnight.  Take these medicines the morning of surgery with A SIP OF WATER: Amlodipine (Norvasc), Allergy medication, Pantoprazole (Protonix)   Please stop taking any vitamins, herbal medications, Meloxicam/Mobic, Ibuprofen, Advil, Motrin, Aleve, etc on Wednesday November 30th   Do not wear jewelry, make-up or nail polish.  Do not wear lotions, powders, or perfumes.    Do not shave 48 hours prior to surgery.    Do not bring valuables to the hospital.  [facility] is not responsible for any belongings or valuables.  Contacts, dentures or bridgework may not be worn into surgery.  Leave your suitcase in the car.  After surgery it may be brought to your room.  For patients admitted to the hospital, discharge time will be determined by your treatment team.  Patients discharged the day of surgery will not be allowed to drive home.   Name and phone number of your driver:    Special instructions:  Shower using CHG soap the night before and the morning of your surgery  Please read over the following fact sheets that you were given. Pain Booklet, Coughing and Deep Breathing, Blood Transfusion Information, Total Joint Packet, MRSA Information and Surgical Site Infection Prevention

## 2015-10-01 NOTE — Progress Notes (Signed)
PCP is Cathlean Cower  Patient denied having any acute cardiac or pulmonary issues  Patient informed Nurse that she takes her medications at night, therefore Nurse instructed patient not to take any medications the morning of surgery. Patient verbalized understanding.

## 2015-10-03 NOTE — Progress Notes (Signed)
Anesthesia Chart Review: Patient is a 49 year old female scheduled for right TKA on 10/10/15 by Dr. Mayer Camel.  History includes never smoker, GERD, HTN, idiopathic chronic thrombycytosis, gestational diabetes. BMI is consistent with obesity. PCP is Dr. Cathlean Cower. She was followed by hematologist Dr. 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 He recommended a baby ASA. In 07/2011, he felt she could have PRN hematology follow-up with continued monitoring by Dr. Jenny Reichmann. Her range over that time was 500-724K. She was negative for the presence of the JAK2 mutation. She is the recent widow of Dr. Blenda Mounts who passed away in 03/08/2023 of this year.  Med include amlodipine, ASA 81mg , benzepril, loratadine, Protonix.   12/05/14 EKG (done at [facility] with Dr. Jenny Reichmann): I think EKG appears as SR, but with inverted p wave in III.   10/01/15 CXR: IMPRESSION: There is no active cardiopulmonary disease.  Preoperative labs noted. BMET, PT/PTT WNL. Serum pregnancy test is negative.  WBC 10.8, H/H 11.9/38.3. PLT 815K (previously [id number] since 09/17/11). Dr. Mayer Camel is aware of PLT count. He is keeping her on ASA perioperatively. He is okay to proceed from his standpoint since she will be on ASA and will have at least some blood loss with surgery, but will likely recommend she follow-up with hematology post-operatively.  Discussed above history/labs with anesthesiologist Dr. Orene Desanctis. Case is posted for spinal. Thrombocytosis history would not necessarily preclude her from spinal anesthesia, but ultimately to be determined by her anesthesiologist on the day of surgery. I discussed this with patient. She has had epidurals with childbirth in the past without apparent complication. Patient denied any headaches, acute visual changes, N/T, or bleeding issues with surgery or dental procedures.  We did discuss her lab results and recommended continue follow-up with Dr. Jenny Reichmann with consideration of  re-evaluation by hematology. Dr. Mayer Camel could always get hematology input regarding post-operative DVT prophylaxis if needed.   George Hugh [facility]/Anesthesiology Phone [phone] 10/03/2015 12:07 PM

## 2015-10-04 ENCOUNTER — Encounter: Payer: Self-pay | Admitting: Gynecology

## 2015-10-04 ENCOUNTER — Ambulatory Visit (INDEPENDENT_AMBULATORY_CARE_PROVIDER_SITE_OTHER): Payer: 59

## 2015-10-04 ENCOUNTER — Ambulatory Visit (INDEPENDENT_AMBULATORY_CARE_PROVIDER_SITE_OTHER): Payer: 59 | Admitting: Gynecology

## 2015-10-04 ENCOUNTER — Other Ambulatory Visit: Payer: Self-pay | Admitting: Gynecology

## 2015-10-04 DIAGNOSIS — R1900 Intra-abdominal and pelvic swelling, mass and lump, unspecified site: Secondary | ICD-10-CM | POA: Diagnosis not present

## 2015-10-04 DIAGNOSIS — N951 Menopausal and female climacteric states: Secondary | ICD-10-CM | POA: Diagnosis not present

## 2015-10-04 DIAGNOSIS — N938 Other specified abnormal uterine and vaginal bleeding: Secondary | ICD-10-CM

## 2015-10-04 DIAGNOSIS — N939 Abnormal uterine and vaginal bleeding, unspecified: Secondary | ICD-10-CM

## 2015-10-04 DIAGNOSIS — N83201 Unspecified ovarian cyst, right side: Secondary | ICD-10-CM

## 2015-10-04 DIAGNOSIS — N83202 Unspecified ovarian cyst, left side: Secondary | ICD-10-CM

## 2015-10-04 NOTE — Patient Instructions (Signed)
Ovarian Cyst An ovarian cyst is a fluid-filled sac that forms on an ovary. The ovaries are small organs that produce eggs in women. Various types of cysts can form on the ovaries. Most are not cancerous. Many do not cause problems, and they often go away on their own. Some may cause symptoms and require treatment. Common types of ovarian cysts include:  Functional cysts--These cysts may occur every month during the menstrual cycle. This is normal. The cysts usually go away with the next menstrual cycle if the woman does not get pregnant. Usually, there are no symptoms with a functional cyst.  Endometrioma cysts--These cysts form from the tissue that lines the uterus. They are also called "chocolate cysts" because they become filled with blood that turns brown. This type of cyst can cause pain in the lower abdomen during intercourse and with your menstrual period.  Cystadenoma cysts--This type develops from the cells on the outside of the ovary. These cysts can get very big and cause lower abdomen pain and pain with intercourse. This type of cyst can twist on itself, cut off its blood supply, and cause severe pain. It can also easily rupture and cause a lot of pain.  Dermoid cysts--This type of cyst is sometimes found in both ovaries. These cysts may contain different kinds of body tissue, such as skin, teeth, hair, or cartilage. They usually do not cause symptoms unless they get very big.  Theca lutein cysts--These cysts occur when too much of a certain hormone (human chorionic gonadotropin) is produced and overstimulates the ovaries to produce an egg. This is most common after procedures used to assist with the conception of a baby (in vitro fertilization). CAUSES   Fertility drugs can cause a condition in which multiple large cysts are formed on the ovaries. This is called ovarian hyperstimulation syndrome.  A condition called polycystic ovary syndrome can cause hormonal imbalances that can lead to  nonfunctional ovarian cysts. SIGNS AND SYMPTOMS  Many ovarian cysts do not cause symptoms. If symptoms are present, they may include:  Pelvic pain or pressure.  Pain in the lower abdomen.  Pain during sexual intercourse.  Increasing girth (swelling) of the abdomen.  Abnormal menstrual periods.  Increasing pain with menstrual periods.  Stopping having menstrual periods without being pregnant. DIAGNOSIS  These cysts are commonly found during a routine or annual pelvic exam. Tests may be ordered to find out more about the cyst. These tests may include:  Ultrasound.  X-ray of the pelvis.  CT scan.  MRI.  Blood tests. TREATMENT  Many ovarian cysts go away on their own without treatment. Your health care provider may want to check your cyst regularly for 2-3 months to see if it changes. For women in menopause, it is particularly important to monitor a cyst closely because of the higher rate of ovarian cancer in menopausal women. When treatment is needed, it may include any of the following:  A procedure to drain the cyst (aspiration). This may be done using a long needle and ultrasound. It can also be done through a laparoscopic procedure. This involves using a thin, lighted tube with a tiny camera on the end (laparoscope) inserted through a small incision.  Surgery to remove the whole cyst. This may be done using laparoscopic surgery or an open surgery involving a larger incision in the lower abdomen.  Hormone treatment or birth control pills. These methods are sometimes used to help dissolve a cyst. HOME CARE INSTRUCTIONS   Only take over-the-counter   or prescription medicines as directed by your health care provider.  Follow up with your health care provider as directed.  Get regular pelvic exams and Pap tests. SEEK MEDICAL CARE IF:   Your periods are late, irregular, or painful, or they stop.  Your pelvic pain or abdominal pain does not go away.  Your abdomen becomes  larger or swollen.  You have pressure on your bladder or trouble emptying your bladder completely.  You have pain during sexual intercourse.  You have feelings of fullness, pressure, or discomfort in your stomach.  You lose weight for no apparent reason.  You feel generally ill.  You become constipated.  You lose your appetite.  You develop acne.  You have an increase in body and facial hair.  You are gaining weight, without changing your exercise and eating habits.  You think you are pregnant. SEEK IMMEDIATE MEDICAL CARE IF:   You have increasing abdominal pain.  You feel sick to your stomach (nauseous), and you throw up (vomit).  You develop a fever that comes on suddenly.  You have abdominal pain during a bowel movement.  Your menstrual periods become heavier than usual. MAKE SURE YOU:  Understand these instructions.  Will watch your condition.  Will get help right away if you are not doing well or get worse.   This information is not intended to replace advice given to you by your health care provider. Make sure you discuss any questions you have with your health care provider.   Document Released: 10/20/2005 Document Revised: 10/25/2013 Document Reviewed: 06/27/2013 Elsevier Interactive Patient Education 2016 [employer]  

## 2015-10-04 NOTE — Progress Notes (Signed)
    Patient presented to the office today as a result of irregular vaginal bleeding that was reported time of her annual exam on 09/10/2015. She described that for the previous. 2 months she has had breakthrough bleeding. We had done an endometrial biopsy at that office visit  Which had demonstrated the following:  Diagnosis Endometrium, biopsy, endocervix/cervix - ATROPHIC ENDOMETRIUM WITH BREAKDOWN IN A BACKGROUND OF BLOOD CLOT, RARE BENIGN ENDOCERVICAL CELLS AND SQUAMOUS EPITHELIAL CELLS. - NO ENDOMETRIAL HYPERPLASIA, ATYPIA, CERVICAL DYSPLASIA OR MALIGNANCY IDENTIFIED.   her Pap smear November 2016 was normal.   Patient here for sonohysterogram. She is scheduled  To have right knee replacement  In a few weeks. Review of her record also indicated that she has had history in the past of mild essential thrombocytosis and had been followed by Dr. Murriel Hopper who had recommend she be on baby aspirin. She last saw him back in 2011 and was released her from her care. She had been tested in the past for the JAK-2 mutation and was negative. Patient with past history resectoscopic myomectomy for prolapsing myoma several years ago.   patient states that since the last office visit she has not had any further vaginal bleeding.  Ultrasound: Uterus measured 8.3 x 6.6 x 5.8 cm with endometrial stripe of 13.7 mm the cervix was cleansed with Betadine solution and a sterile catheter was introduced into the uterine cavity and no intracavitary defect was noted. The right ovary demonstrated a thin wall cyst with septation measuring 26 x 25 x 28 mm with negative color flow. Blood flow was seen to the right ovary otherwise. Left ovary was enlarged multiseptated cystic mass with echo-free areas measuring  5.7 x 5.7 x 5.6 cm and a reticular echo pattern additional cyst measuring 4.1 x 4.0 with arterial blood flow to the ovarian tissue but negative color flow on both masses. There was no fluid in the cul-de-sac.    Assessment/plan perimenopausal patient who expressed dysfunction uterine bleeding at last office visit no further bleeding. Recent endometrial biopsy benign. Due to the fact that patient had had past history resectoscopic myomectomy a sonohysterogram had been ordered for today to rule out any recurrence. No intracavitary defect was noted but incidental finding of bilateral ovarian cysts  As described above. We are going to obtain an ovarian cancer  Screening blood test Roma-1  And have her follow-up for an ultrasound  In 2 months. We'll tentatively select a surgical date for laparoscopic bilateral salpingo-oophorectomy  In February and repeat the ultrasound 2 weeks before scheduled laparoscopic surgery. If cyst is resolved we'll cancel  Her surgery. And the ovarian cancer screening test is abnormal we'll refer to GYN oncologist. Patient is scheduled for knee replacement in the next few weeks. Literature information provided.

## 2015-10-09 DIAGNOSIS — M1711 Unilateral primary osteoarthritis, right knee: Secondary | ICD-10-CM | POA: Diagnosis present

## 2015-10-09 MED ORDER — DEXTROSE-NACL 5-0.45 % IV SOLN: INTRAVENOUS | Status: DC

## 2015-10-09 MED ORDER — CHLORHEXIDINE GLUCONATE 4 % EX LIQD: 60.0000 mL | Freq: Once | CUTANEOUS | Status: DC

## 2015-10-09 MED ORDER — CEFAZOLIN SODIUM-DEXTROSE 2-3 GM-% IV SOLR
2.0000 g | INTRAVENOUS | Status: AC
  Administered 2015-10-10: 2 g via INTRAVENOUS
  Filled 2015-10-09: qty 50

## 2015-10-09 NOTE — H&P (Signed)
TOTAL KNEE ADMISSION H&P  Patient is being admitted for right total knee arthroplasty.  Subjective:  Chief Complaint:right knee pain.  HPI: [name], 49 y.o. female, has a history of pain and functional disability in the right knee due to arthritis and has failed non-surgical conservative treatments for greater than 12 weeks to includeNSAID's and/or analgesics, flexibility and strengthening excercises, weight reduction as appropriate and activity modification.  Onset of symptoms was gradual, starting several years ago with gradually worsening course since that time. The patient noted no past surgery on the right knee(s).  Patient currently rates pain in the right knee(s) at 10 out of 10 with activity. Patient has night pain, worsening of pain with activity and weight bearing, pain that interferes with activities of daily living, pain with passive range of motion, crepitus and joint swelling.  Patient has evidence of joint subluxation and joint space narrowing by imaging studies. . There is no active infection.  Patient Active Problem List   Diagnosis Date Noted  . Right knee pain 12/16/2013  . Internal hemorrhoids 07/07/2013  . Rectal bleeding 07/07/2013  . Mastodynia, female 01/20/2013  . Impaired glucose tolerance 09/13/2011  . Preventative health care 09/13/2011  . GERD 03/19/2010  . FIBROIDS, UTERUS 09/10/2007  . THROMBOCYTHEMIA 09/10/2007  . Obesity, unspecified 09/10/2007  . Essential hypertension 09/10/2007   Past Medical History  Diagnosis Date  . FIBROIDS, UTERUS   . GERD   . HYPERTENSION   . Obesity, unspecified   . Other abnormal glucose   . THROMBOCYTHEMIA   . NSVD (normal spontaneous vaginal delivery)     X3  . Diverticulosis of colon (without mention of hemorrhage)   . Diabetes mellitus     GESTATIONAL  . Arthritis     Past Surgical History  Procedure Laterality Date  . Knee arthroscopy Right 2006    Dr Ronnie Derby  . Resectoscopic polypectomy    .  Hysteroscopy  06/19/2005    ASPIRATION OF LEFT PARATUBAL CYST  . Colonoscopy    . Tubal ligation  2006    FILSHIE CLIP TECHNIQUE  . Uterine fibroid surgery      No prescriptions prior to admission   No Known Allergies  Social History  Substance Use Topics  . Smoking status: Never Smoker   . Smokeless tobacco: Never Used  . Alcohol Use: 0.0 oz/week    0 Standard drinks or equivalent per week     Comment: occassionally    Family History  Problem Relation Age of Onset  . Breast cancer Mother   . Diabetes Father   . Hypertension Father   . Colon cancer Neg Hx   . Stomach cancer Neg Hx   . Rectal cancer Neg Hx      Review of Systems  Constitutional: Negative.   HENT: Negative.   Eyes: Negative.   Respiratory: Negative.   Cardiovascular: Positive for leg swelling.  Gastrointestinal:       Rectal bleeding  Genitourinary: Negative.   Musculoskeletal: Positive for joint pain.  Skin: Negative.   Neurological: Negative.   Endo/Heme/Allergies: Negative.   Psychiatric/Behavioral: Negative.     Objective:  Physical Exam  Constitutional: She is oriented to person, place, and time. She appears well-developed and well-nourished.  HENT:  Head: Normocephalic and atraumatic.  Eyes: Pupils are equal, round, and reactive to light.  Neck: Normal range of motion. Neck supple.  Cardiovascular: Intact distal pulses.   Respiratory: Effort normal.  Musculoskeletal: She exhibits tenderness.  Patient has obvious varus configuration.  Both knees intact to testing of the quadriceps and hamstring muscles.  Collateral ligaments are stable.  Varus stress reproduces her pain.  Range of motion is 5/125.    Neurological: She is alert and oriented to person, place, and time.  Skin: Skin is warm and dry.  Psychiatric: She has a normal mood and affect. Her behavior is normal. Judgment and thought content normal.    Vital signs in last 24 hours:    Labs:   Estimated body mass index is 39.56  kg/(m^2) as calculated from the following:   Height as of 12/15/14: 5\' 6"  (1.676 m).   Weight as of 12/15/14: 111.131 kg (245 lb).   Imaging Review Plain radiographs demonstrate standing AP, Rosenberg, lateral and sunrise x-rays show end-stage arthritis of both knees, right greater than left, bone-on-bone to the medial compartment peripheral osteophytes that are a centimeter in size lateral shift of the tibia beneath the femur of about 6 mm bilaterally.  Assessment/Plan:  End stage arthritis, right knee   The patient history, physical examination, clinical judgment of the provider and imaging studies are consistent with end stage degenerative joint disease of the right knee(s) and total knee arthroplasty is deemed medically necessary. The treatment options including medical management, injection therapy arthroscopy and arthroplasty were discussed at length. The risks and benefits of total knee arthroplasty were presented and reviewed. The risks due to aseptic loosening, infection, stiffness, patella tracking problems, thromboembolic complications and other imponderables were discussed. The patient acknowledged the explanation, agreed to proceed with the plan and consent was signed. Patient is being admitted for inpatient treatment for surgery, pain control, PT, OT, prophylactic antibiotics, VTE prophylaxis, progressive ambulation and ADL's and discharge planning. The patient is planning to be discharged home with home health services

## 2015-10-10 ENCOUNTER — Inpatient Hospital Stay (HOSPITAL_COMMUNITY): Payer: 59 | Admitting: Emergency Medicine

## 2015-10-10 ENCOUNTER — Encounter (HOSPITAL_COMMUNITY): Payer: Self-pay | Admitting: Surgery

## 2015-10-10 ENCOUNTER — Encounter (HOSPITAL_COMMUNITY)
Admission: RE | Disposition: A | Discharge status: Home Health | Payer: Self-pay | Source: Ambulatory Visit | Attending: Orthopedic Surgery

## 2015-10-10 ENCOUNTER — Inpatient Hospital Stay (HOSPITAL_COMMUNITY): Payer: 59 | Admitting: Anesthesiology

## 2015-10-10 ENCOUNTER — Inpatient Hospital Stay (HOSPITAL_COMMUNITY)
Admission: RE | Admit: 2015-10-10 | Discharge: 2015-10-12 | DRG: 470 | Disposition: A | Discharge status: Home Health | Payer: 59 | Source: Ambulatory Visit | Attending: Orthopedic Surgery | Admitting: Orthopedic Surgery

## 2015-10-10 DIAGNOSIS — D62 Acute posthemorrhagic anemia: Secondary | ICD-10-CM | POA: Diagnosis not present

## 2015-10-10 DIAGNOSIS — E669 Obesity, unspecified: Secondary | ICD-10-CM | POA: Diagnosis present

## 2015-10-10 DIAGNOSIS — M1711 Unilateral primary osteoarthritis, right knee: Principal | ICD-10-CM | POA: Diagnosis present

## 2015-10-10 DIAGNOSIS — Z6838 Body mass index (BMI) 38.0-38.9, adult: Secondary | ICD-10-CM

## 2015-10-10 DIAGNOSIS — I10 Essential (primary) hypertension: Secondary | ICD-10-CM | POA: Diagnosis present

## 2015-10-10 DIAGNOSIS — M1710 Unilateral primary osteoarthritis, unspecified knee: Secondary | ICD-10-CM | POA: Diagnosis present

## 2015-10-10 DIAGNOSIS — K219 Gastro-esophageal reflux disease without esophagitis: Secondary | ICD-10-CM | POA: Diagnosis present

## 2015-10-10 HISTORY — PX: TOTAL KNEE ARTHROPLASTY: SHX125

## 2015-10-10 LAB — OVARIAN MALIGNANCY RISK-ROMA
CA125: 8 U/mL (ref <35)
HE4: 42 pmol (ref <151)
ROMA PREMENOPAUSAL: 0.49 (ref <1.31)
ROMA Postmenopausal: 0.64 (ref <2.77)

## 2015-10-10 SURGERY — ARTHROPLASTY, KNEE, TOTAL
Anesthesia: Monitor Anesthesia Care | Site: Knee | Laterality: Right

## 2015-10-10 MED ORDER — FENTANYL CITRATE (PF) 250 MCG/5ML IJ SOLN
INTRAMUSCULAR | Status: AC
  Filled 2015-10-10: qty 5

## 2015-10-10 MED ORDER — OXYCODONE HCL 5 MG PO TABS
5.0000 mg | ORAL_TABLET | Freq: Once | ORAL | Status: AC | PRN
  Administered 2015-10-10: 5 mg via ORAL

## 2015-10-10 MED ORDER — ACETAMINOPHEN 650 MG RE SUPP: 650.0000 mg | Freq: Four times a day (QID) | RECTAL | Status: DC | PRN

## 2015-10-10 MED ORDER — LIDOCAINE HCL (CARDIAC) 20 MG/ML IV SOLN
INTRAVENOUS | Status: AC
  Filled 2015-10-10: qty 5

## 2015-10-10 MED ORDER — EPHEDRINE SULFATE 50 MG/ML IJ SOLN
INTRAMUSCULAR | Status: AC
  Filled 2015-10-10: qty 1

## 2015-10-10 MED ORDER — ALUM & MAG HYDROXIDE-SIMETH 200-200-20 MG/5ML PO SUSP
30.0000 mL | ORAL | Status: DC | PRN
  Administered 2015-10-11: 30 mL via ORAL
  Filled 2015-10-10: qty 30

## 2015-10-10 MED ORDER — PHENYLEPHRINE HCL 10 MG/ML IJ SOLN
INTRAMUSCULAR | Status: DC | PRN
  Administered 2015-10-10 (×2): 80 ug via INTRAVENOUS
  Administered 2015-10-10: 60 ug via INTRAVENOUS

## 2015-10-10 MED ORDER — HYDROMORPHONE HCL 1 MG/ML IJ SOLN
0.5000 mg | INTRAMUSCULAR | Status: DC | PRN
  Administered 2015-10-10 – 2015-10-11 (×3): 1 mg via INTRAVENOUS
  Filled 2015-10-10 (×3): qty 1

## 2015-10-10 MED ORDER — ONDANSETRON HCL 4 MG/2ML IJ SOLN
4.0000 mg | Freq: Four times a day (QID) | INTRAMUSCULAR | Status: DC | PRN
  Administered 2015-10-10: 4 mg via INTRAVENOUS
  Filled 2015-10-10: qty 2

## 2015-10-10 MED ORDER — SODIUM CHLORIDE 0.9 % IR SOLN
Status: DC | PRN
  Administered 2015-10-10: 1000 mL

## 2015-10-10 MED ORDER — MELOXICAM 7.5 MG PO TABS
15.0000 mg | ORAL_TABLET | Freq: Every day | ORAL | Status: DC
  Administered 2015-10-12: 15 mg via ORAL
  Filled 2015-10-10 (×2): qty 2

## 2015-10-10 MED ORDER — OXYCODONE HCL 5 MG PO TABS
5.0000 mg | ORAL_TABLET | ORAL | Status: DC | PRN
  Administered 2015-10-10 – 2015-10-12 (×13): 10 mg via ORAL
  Filled 2015-10-10 (×12): qty 2

## 2015-10-10 MED ORDER — DOCUSATE SODIUM 100 MG PO CAPS
100.0000 mg | ORAL_CAPSULE | Freq: Two times a day (BID) | ORAL | Status: DC
  Administered 2015-10-10 – 2015-10-12 (×4): 100 mg via ORAL
  Filled 2015-10-10 (×4): qty 1

## 2015-10-10 MED ORDER — ASPIRIN EC 325 MG PO TBEC: 325.0000 mg | DELAYED_RELEASE_TABLET | Freq: Two times a day (BID) | ORAL | Status: DC

## 2015-10-10 MED ORDER — ASPIRIN EC 325 MG PO TBEC
325.0000 mg | DELAYED_RELEASE_TABLET | Freq: Every day | ORAL | Status: DC
  Administered 2015-10-11 – 2015-10-12 (×2): 325 mg via ORAL
  Filled 2015-10-10 (×2): qty 1

## 2015-10-10 MED ORDER — BUPIVACAINE LIPOSOME 1.3 % IJ SUSP
INTRAMUSCULAR | Status: DC | PRN
  Administered 2015-10-10: 20 mL

## 2015-10-10 MED ORDER — ACETAMINOPHEN 325 MG PO TABS: 325.0000 mg | ORAL_TABLET | ORAL | Status: DC | PRN

## 2015-10-10 MED ORDER — BISACODYL 5 MG PO TBEC: 5.0000 mg | DELAYED_RELEASE_TABLET | Freq: Every day | ORAL | Status: DC | PRN

## 2015-10-10 MED ORDER — PROPOFOL 10 MG/ML IV BOLUS
INTRAVENOUS | Status: AC
  Filled 2015-10-10: qty 20

## 2015-10-10 MED ORDER — TRANEXAMIC ACID 1000 MG/10ML IV SOLN
2000.0000 mg | Freq: Once | INTRAVENOUS | Status: AC
  Administered 2015-10-10: 2000 mg via TOPICAL
  Filled 2015-10-10: qty 20

## 2015-10-10 MED ORDER — BUPIVACAINE-EPINEPHRINE (PF) 0.25% -1:200000 IJ SOLN
INTRAMUSCULAR | Status: AC
  Filled 2015-10-10: qty 30

## 2015-10-10 MED ORDER — LORATADINE 10 MG PO TABS
10.0000 mg | ORAL_TABLET | Freq: Every day | ORAL | Status: DC
  Administered 2015-10-11 – 2015-10-12 (×2): 10 mg via ORAL
  Filled 2015-10-10 (×2): qty 1

## 2015-10-10 MED ORDER — DIPHENHYDRAMINE HCL 12.5 MG/5ML PO ELIX: 12.5000 mg | ORAL_SOLUTION | ORAL | Status: DC | PRN

## 2015-10-10 MED ORDER — FENTANYL CITRATE (PF) 100 MCG/2ML IJ SOLN
INTRAMUSCULAR | Status: DC | PRN
Start: 2015-10-10 — End: 2015-10-10
  Administered 2015-10-10: 100 ug via INTRAVENOUS

## 2015-10-10 MED ORDER — NYSTATIN-TRIAMCINOLONE 100000-0.1 UNIT/GM-% EX CREA
TOPICAL_CREAM | Freq: Three times a day (TID) | CUTANEOUS | Status: DC
  Filled 2015-10-10: qty 15

## 2015-10-10 MED ORDER — PROPOFOL 500 MG/50ML IV EMUL
INTRAVENOUS | Status: DC | PRN
  Administered 2015-10-10: 75 ug/kg/min via INTRAVENOUS

## 2015-10-10 MED ORDER — MIDAZOLAM HCL 5 MG/5ML IJ SOLN
INTRAMUSCULAR | Status: DC | PRN
  Administered 2015-10-10 (×2): 1 mg via INTRAVENOUS

## 2015-10-10 MED ORDER — MENTHOL 3 MG MT LOZG
1.0000 | LOZENGE | OROMUCOSAL | Status: DC | PRN
  Filled 2015-10-10: qty 9

## 2015-10-10 MED ORDER — DEXTROSE 5 % IV SOLN
500.0000 mg | Freq: Four times a day (QID) | INTRAVENOUS | Status: DC | PRN
  Filled 2015-10-10: qty 5

## 2015-10-10 MED ORDER — PANTOPRAZOLE SODIUM 40 MG PO TBEC
40.0000 mg | DELAYED_RELEASE_TABLET | Freq: Every day | ORAL | Status: DC
  Administered 2015-10-11 – 2015-10-12 (×2): 40 mg via ORAL
  Filled 2015-10-10 (×2): qty 1

## 2015-10-10 MED ORDER — HYDROMORPHONE HCL 1 MG/ML IJ SOLN
INTRAMUSCULAR | Status: AC
  Administered 2015-10-10: 0.5 mg via INTRAVENOUS
  Filled 2015-10-10: qty 1

## 2015-10-10 MED ORDER — MIDAZOLAM HCL 2 MG/2ML IJ SOLN
INTRAMUSCULAR | Status: AC
  Filled 2015-10-10: qty 2

## 2015-10-10 MED ORDER — OXYCODONE HCL 5 MG PO TABS
ORAL_TABLET | ORAL | Status: AC
  Filled 2015-10-10: qty 2

## 2015-10-10 MED ORDER — HYDROMORPHONE HCL 1 MG/ML IJ SOLN
0.2500 mg | INTRAMUSCULAR | Status: DC | PRN
  Administered 2015-10-10: 0.05 mg via INTRAVENOUS
  Administered 2015-10-10 (×3): 0.5 mg via INTRAVENOUS

## 2015-10-10 MED ORDER — BENAZEPRIL HCL 20 MG PO TABS
10.0000 mg | ORAL_TABLET | Freq: Every day | ORAL | Status: DC
  Administered 2015-10-11 – 2015-10-12 (×2): 10 mg via ORAL
  Filled 2015-10-10 (×2): qty 1

## 2015-10-10 MED ORDER — AMLODIPINE BESYLATE 5 MG PO TABS
5.0000 mg | ORAL_TABLET | Freq: Every day | ORAL | Status: DC
  Administered 2015-10-11 – 2015-10-12 (×2): 5 mg via ORAL
  Filled 2015-10-10 (×2): qty 1

## 2015-10-10 MED ORDER — PHENYLEPHRINE HCL 10 MG/ML IJ SOLN
10.0000 mg | INTRAMUSCULAR | Status: DC | PRN
  Administered 2015-10-10: 25 ug/min via INTRAVENOUS

## 2015-10-10 MED ORDER — PHENYLEPHRINE 40 MCG/ML (10ML) SYRINGE FOR IV PUSH (FOR BLOOD PRESSURE SUPPORT)
PREFILLED_SYRINGE | INTRAVENOUS | Status: AC
  Filled 2015-10-10: qty 10

## 2015-10-10 MED ORDER — METHOCARBAMOL 500 MG PO TABS
500.0000 mg | ORAL_TABLET | Freq: Four times a day (QID) | ORAL | Status: DC | PRN
  Administered 2015-10-11 – 2015-10-12 (×5): 500 mg via ORAL
  Filled 2015-10-10 (×6): qty 1

## 2015-10-10 MED ORDER — BUPIVACAINE LIPOSOME 1.3 % IJ SUSP
20.0000 mL | INTRAMUSCULAR | Status: AC
  Administered 2015-10-10: 20 mL
  Filled 2015-10-10: qty 20

## 2015-10-10 MED ORDER — METOCLOPRAMIDE HCL 5 MG/ML IJ SOLN: 5.0000 mg | Freq: Three times a day (TID) | INTRAMUSCULAR | Status: DC | PRN

## 2015-10-10 MED ORDER — PHENOL 1.4 % MT LIQD: 1.0000 | OROMUCOSAL | Status: DC | PRN

## 2015-10-10 MED ORDER — LACTATED RINGERS IV SOLN
INTRAVENOUS | Status: DC
  Administered 2015-10-10: 11:00:00 via INTRAVENOUS

## 2015-10-10 MED ORDER — ONDANSETRON HCL 4 MG PO TABS
4.0000 mg | ORAL_TABLET | Freq: Four times a day (QID) | ORAL | Status: DC | PRN
  Administered 2015-10-11: 4 mg via ORAL
  Filled 2015-10-10: qty 1

## 2015-10-10 MED ORDER — GLYCOPYRROLATE 0.2 MG/ML IJ SOLN
INTRAMUSCULAR | Status: AC
  Filled 2015-10-10: qty 1

## 2015-10-10 MED ORDER — METOCLOPRAMIDE HCL 5 MG PO TABS: 5.0000 mg | ORAL_TABLET | Freq: Three times a day (TID) | ORAL | Status: DC | PRN

## 2015-10-10 MED ORDER — BUPIVACAINE IN DEXTROSE 0.75-8.25 % IT SOLN
INTRATHECAL | Status: DC | PRN
  Administered 2015-10-10: 1.8 mL via INTRATHECAL

## 2015-10-10 MED ORDER — LACTATED RINGERS IV SOLN
INTRAVENOUS | Status: DC | PRN
  Administered 2015-10-10 (×2): via INTRAVENOUS

## 2015-10-10 MED ORDER — FLEET ENEMA 7-19 GM/118ML RE ENEM: 1.0000 | ENEMA | Freq: Once | RECTAL | Status: DC | PRN

## 2015-10-10 MED ORDER — LIDOCAINE HCL (CARDIAC) 20 MG/ML IV SOLN
INTRAVENOUS | Status: DC | PRN
  Administered 2015-10-10: 50 mg via INTRAVENOUS

## 2015-10-10 MED ORDER — SENNOSIDES-DOCUSATE SODIUM 8.6-50 MG PO TABS: 1.0000 | ORAL_TABLET | Freq: Every evening | ORAL | Status: DC | PRN

## 2015-10-10 MED ORDER — ACETAMINOPHEN 325 MG PO TABS: 650.0000 mg | ORAL_TABLET | Freq: Four times a day (QID) | ORAL | Status: DC | PRN

## 2015-10-10 MED ORDER — METHOCARBAMOL 500 MG PO TABS
500.0000 mg | ORAL_TABLET | Freq: Two times a day (BID) | ORAL | Status: DC
Start: 2015-10-10 — End: 2016-04-03

## 2015-10-10 MED ORDER — OXYCODONE HCL 5 MG/5ML PO SOLN: 5.0000 mg | Freq: Once | ORAL | Status: AC | PRN

## 2015-10-10 MED ORDER — CALCIUM CARBONATE 600 MG PO TABS: 600.0000 mg | ORAL_TABLET | Freq: Two times a day (BID) | ORAL | Status: DC

## 2015-10-10 MED ORDER — OXYCODONE HCL 5 MG PO TABS
ORAL_TABLET | ORAL | Status: AC
  Administered 2015-10-10: 10 mg via ORAL
  Filled 2015-10-10: qty 1

## 2015-10-10 MED ORDER — CALCIUM CARBONATE 1250 (500 CA) MG PO TABS
1.0000 | ORAL_TABLET | Freq: Two times a day (BID) | ORAL | Status: DC
  Administered 2015-10-11 – 2015-10-12 (×2): 500 mg via ORAL
  Filled 2015-10-10 (×5): qty 1

## 2015-10-10 MED ORDER — SODIUM CHLORIDE 0.9 % IJ SOLN
INTRAMUSCULAR | Status: DC | PRN
  Administered 2015-10-10: 20 mL via INTRAVENOUS

## 2015-10-10 MED ORDER — ACETAMINOPHEN 160 MG/5ML PO SOLN
325.0000 mg | ORAL | Status: DC | PRN
  Filled 2015-10-10: qty 20.3

## 2015-10-10 MED ORDER — OXYCODONE-ACETAMINOPHEN 5-325 MG PO TABS: 1.0000 | ORAL_TABLET | ORAL | Status: DC | PRN

## 2015-10-10 MED ORDER — KCL IN DEXTROSE-NACL 20-5-0.45 MEQ/L-%-% IV SOLN
INTRAVENOUS | Status: DC
  Administered 2015-10-10 – 2015-10-11 (×2): via INTRAVENOUS
  Filled 2015-10-10 (×3): qty 1000

## 2015-10-10 SURGICAL SUPPLY — 67 items
BANDAGE ELASTIC 6 VELCRO ST LF (GAUZE/BANDAGES/DRESSINGS) ×2 IMPLANT
BANDAGE ESMARK 6X9 LF (GAUZE/BANDAGES/DRESSINGS) ×1 IMPLANT
BLADE SAG 18X100X1.27 (BLADE) ×2 IMPLANT
BLADE SAW SGTL 13X75X1.27 (BLADE) ×2 IMPLANT
BLADE SURG ROTATE 9660 (MISCELLANEOUS) IMPLANT
BNDG ELASTIC 6X10 VLCR STRL LF (GAUZE/BANDAGES/DRESSINGS) ×2 IMPLANT
BNDG ESMARK 6X9 LF (GAUZE/BANDAGES/DRESSINGS) ×2
BOWL SMART MIX CTS (DISPOSABLE) ×2 IMPLANT
CAPT KNEE TOTAL 3 ATTUNE ×2 IMPLANT
CEMENT HV SMART SET (Cement) ×4 IMPLANT
COVER SURGICAL LIGHT HANDLE (MISCELLANEOUS) ×2 IMPLANT
CUFF TOURNIQUET SINGLE 34IN LL (TOURNIQUET CUFF) ×2 IMPLANT
CUFF TOURNIQUET SINGLE 44IN (TOURNIQUET CUFF) IMPLANT
DRAPE EXTREMITY T 121X128X90 (DRAPE) ×2 IMPLANT
DRAPE U-SHAPE 47X51 STRL (DRAPES) ×2 IMPLANT
DRSG ADAPTIC 3X8 NADH LF (GAUZE/BANDAGES/DRESSINGS) ×2 IMPLANT
DURAPREP 26ML APPLICATOR (WOUND CARE) ×4 IMPLANT
ELECT REM PT RETURN 9FT ADLT (ELECTROSURGICAL) ×2
ELECTRODE REM PT RTRN 9FT ADLT (ELECTROSURGICAL) ×1 IMPLANT
EVACUATOR 1/8 PVC DRAIN (DRAIN) IMPLANT
FACESHIELD WRAPAROUND (MASK) ×2 IMPLANT
GAUZE SPONGE 4X4 12PLY STRL (GAUZE/BANDAGES/DRESSINGS) ×4 IMPLANT
GAUZE XEROFORM 1X8 LF (GAUZE/BANDAGES/DRESSINGS) ×2 IMPLANT
GLOVE BIO SURGEON STRL SZ7.5 (GLOVE) ×2 IMPLANT
GLOVE BIO SURGEON STRL SZ8.5 (GLOVE) ×2 IMPLANT
GLOVE BIOGEL PI IND STRL 6.5 (GLOVE) ×1 IMPLANT
GLOVE BIOGEL PI IND STRL 7.0 (GLOVE) ×1 IMPLANT
GLOVE BIOGEL PI IND STRL 8 (GLOVE) ×1 IMPLANT
GLOVE BIOGEL PI IND STRL 9 (GLOVE) ×1 IMPLANT
GLOVE BIOGEL PI INDICATOR 6.5 (GLOVE) ×1
GLOVE BIOGEL PI INDICATOR 7.0 (GLOVE) ×1
GLOVE BIOGEL PI INDICATOR 8 (GLOVE) ×1
GLOVE BIOGEL PI INDICATOR 9 (GLOVE) ×1
GOWN STRL REUS W/ TWL LRG LVL3 (GOWN DISPOSABLE) ×1 IMPLANT
GOWN STRL REUS W/ TWL XL LVL3 (GOWN DISPOSABLE) ×2 IMPLANT
GOWN STRL REUS W/TWL LRG LVL3 (GOWN DISPOSABLE) ×1
GOWN STRL REUS W/TWL XL LVL3 (GOWN DISPOSABLE) ×2
HANDPIECE INTERPULSE COAX TIP (DISPOSABLE) ×1
HOOD PEEL AWAY FACE SHEILD DIS (HOOD) ×4 IMPLANT
KIT BASIN OR (CUSTOM PROCEDURE TRAY) ×2 IMPLANT
KIT ROOM TURNOVER OR (KITS) ×2 IMPLANT
MANIFOLD NEPTUNE II (INSTRUMENTS) ×2 IMPLANT
NEEDLE SPNL 18GX3.5 QUINCKE PK (NEEDLE) ×2 IMPLANT
NS IRRIG 1000ML POUR BTL (IV SOLUTION) ×2 IMPLANT
PACK TOTAL JOINT (CUSTOM PROCEDURE TRAY) ×2 IMPLANT
PACK UNIVERSAL I (CUSTOM PROCEDURE TRAY) ×2 IMPLANT
PAD ARMBOARD 7.5X6 YLW CONV (MISCELLANEOUS) ×4 IMPLANT
PADDING CAST ABS 6INX4YD NS (CAST SUPPLIES) ×1
PADDING CAST ABS COTTON 6X4 NS (CAST SUPPLIES) ×1 IMPLANT
PADDING CAST COTTON 6X4 STRL (CAST SUPPLIES) ×2 IMPLANT
SET HNDPC FAN SPRY TIP SCT (DISPOSABLE) ×1 IMPLANT
SPONGE GAUZE 4X4 12PLY STER LF (GAUZE/BANDAGES/DRESSINGS) ×2 IMPLANT
SPONGE LAP 18X18 X RAY DECT (DISPOSABLE) ×2 IMPLANT
SUT VIC AB 0 CT1 27 (SUTURE) ×1
SUT VIC AB 0 CT1 27XBRD ANBCTR (SUTURE) ×1 IMPLANT
SUT VIC AB 1 CTX 36 (SUTURE) ×1
SUT VIC AB 1 CTX36XBRD ANBCTR (SUTURE) ×1 IMPLANT
SUT VIC AB 2-0 CT1 27 (SUTURE) ×1
SUT VIC AB 2-0 CT1 TAPERPNT 27 (SUTURE) ×1 IMPLANT
SUT VIC AB 3-0 CT1 27 (SUTURE) ×2
SUT VIC AB 3-0 CT1 TAPERPNT 27 (SUTURE) ×2 IMPLANT
SUT VIC AB 3-0 FS2 27 (SUTURE) ×2 IMPLANT
SYR 50ML LL SCALE MARK (SYRINGE) ×2 IMPLANT
TOWEL OR 17X24 6PK STRL BLUE (TOWEL DISPOSABLE) ×2 IMPLANT
TOWEL OR 17X26 10 PK STRL BLUE (TOWEL DISPOSABLE) ×2 IMPLANT
TRAY CATH 16FR W/PLASTIC CATH (SET/KITS/TRAYS/PACK) IMPLANT
WATER STERILE IRR 1000ML POUR (IV SOLUTION) ×6 IMPLANT

## 2015-10-10 NOTE — Interval H&P Note (Signed)
History and Physical Interval Note:  10/10/2015 11:23 AM  [name]  has presented today for surgery, with the diagnosis of RIGHT KNEE OSTEOARTHRITIS  The various methods of treatment have been discussed with the patient and family. After consideration of risks, benefits and other options for treatment, the patient has consented to  Procedure(s): TOTAL RIGHT KNEE ARTHROPLASTY (Right) as a surgical intervention .  The patient's history has been reviewed, patient examined, no change in status, stable for surgery.  I have reviewed the patient's chart and labs.  Questions were answered to the patient's satisfaction.     Kerin Salen

## 2015-10-10 NOTE — Op Note (Signed)
PATIENT ID:      [name]  MRN:     [id number] DOB/AGE:    1966/03/30 / 49 y.o.       OPERATIVE REPORT    DATE OF PROCEDURE:  10/10/2015       PREOPERATIVE DIAGNOSIS:   RIGHT KNEE OSTEOARTHRITIS      Estimated body mass index is 38.46 kg/(m^2) as calculated from the following:   Height as of this encounter: 5\' 6"  (1.676 m).   Weight as of this encounter: 108.041 kg (238 lb 3 oz).                                                        POSTOPERATIVE DIAGNOSIS:   Right Knee Osteoarthritis.                                                                      PROCEDURE:  Procedure(s): TOTAL RIGHT KNEE ARTHROPLASTY Using DepuyAttune RP implants #5R Femur, #5Tibia, 5 mm Attune RP bearing, 38 Patella     SURGEON: Cope Marte J    ASSISTANT:   [employer] K[employer]   (Present and scrubbed throughout the case, critical for assistance with exposure, retraction, instrumentation, and closure.)         ANESTHESIA: Spinal, Exparel  EBL: 300  FLUID REPLACEMENT: 1500 crystalloid  TOURNIQUET TIME: 54min  Drains: None  Tranexamic Acid: 2gm topical   COMPLICATIONS:  None         INDICATIONS FOR PROCEDURE: The patient has  RIGHT KNEE OSTEOARTHRITIS, Varus deformities, XR shows bone on bone arthritis, lateral subluxation of tibia. Patient has failed all conservative measures including anti-inflammatory medicines, narcotics, attempts at  exercise and weight loss, cortisone injections and viscosupplementation.  Risks and benefits of surgery have been discussed, questions answered.   DESCRIPTION OF PROCEDURE: The patient identified by armband, received  IV antibiotics, in the holding area at [facility]. Patient taken to the operating room, appropriate anesthetic  monitors were attached, and Spinal anesthesia was  induced. Tourniquet  applied high to the operative thigh. Lateral post and foot positioner  applied to the table, the lower extremity was then prepped and draped  in usual sterile  fashion from the toes to the tourniquet. Time-out procedure was performed. We began the operation, with the knee flexed 120 degrees, by making the anterior midline incision starting at handbreadth above the patella going over the patella 1 cm medial to and 4 cm distal to the tibial tubercle. Small bleeders in the skin and the  subcutaneous tissue identified and cauterized. Transverse retinaculum was incised and reflected medially and a medial parapatellar arthrotomy was accomplished. the patella was everted and theprepatellar fat pad resected. The superficial medial collateral  ligament was then elevated from anterior to posterior along the proximal  flare of the tibia and anterior half of the menisci resected. The knee was hyperflexed exposing bone on bone arthritis. Peripheral and notch osteophytes as well as the cruciate ligaments were then resected. We continued to  work our way around posteriorly along the proximal tibia, and externally  rotated the tibia subluxing it out from underneath the femur. A McHale  retractor was placed through the notch and a lateral Hohmann retractor  placed, and we then drilled through the proximal tibia in line with the  axis of the tibia followed by an intramedullary guide rod and 2-degree  posterior slope cutting guide. The tibial cutting guide, 3 degree posterior sloped, was pinned into place allowing resection of 4 mm of bone medially and 10 mm of bone laterally. Satisfied with the tibial resection, we then  entered the distal femur 2 mm anterior to the PCL origin with the  intramedullary guide rod and applied the distal femoral cutting guide  set at 9 mm, with 5 degrees of valgus. This was pinned along the  epicondylar axis. At this point, the distal femoral cut was accomplished without difficulty. We then sized for a #5R femoral component and pinned the guide in 3 degrees of external rotation. The chamfer cutting guide was pinned into place. The anterior,  posterior, and chamfer cuts were accomplished without difficulty followed by  the Attune RP box cutting guide and the box cut. We also removed posterior osteophytes from the posterior femoral condyles. At this  time, the knee was brought into full extension. We checked our  extension and flexion gaps and found them symmetric for a 5 mm bearing. Distracting in extension with a lamina spreader, the posterior horns of the menisci were removed, and Exparel, diluted to 60 cc, was injected into the capsule and synovium of the knee. The posterior patella cut was accomplished with the 9.5 mm Attune cutting guide, sized at 38 dome, and the fixation pegs drilled.The knee  was then once again hyperflexed exposing the proximal tibia. We sized for a # 5 tibial base plate, applied the smokestack and the conical reamer followed by the the Delta fin keel punch. We then hammered into place the Attune RP trial femoral component, drilled the lugs, inserted a  5 mm trial bearing, trial patellar button, and took the knee through range of motion from 0-130 degrees. No thumb pressure was required for patellar Tracking. At this point, the limb was wrapped with an Esmarch bandage and the tourniquet inflated to 350 mmHg. All trial components were removed, mating surfaces irrigated with pulse lavage, and dried with suction and sponges. A double batch of DePuy HV cement with 1500 mg of Zinacef was mixed and applied to all bony metallic mating surfaces except for the posterior condyles of the femur itself. In order, we  hammered into place the tibial tray and removed excess cement, the femoral component and removed excess cement. The final Attune RP bearing  was inserted, and the knee brought to full extension with compression.  The patellar button was clamped into place, and excess cement  removed. While the cement cured the wound was irrigated out with normal saline solution pulse lavage. Ligament stability and patellar tracking were  checked and found to be excellent. The parapatellar arthrotomy was closed with  running #1 Vicryl suture. The subcutaneous tissue with 0 and 2-0 undyed  Vicryl suture, and the skin with running 3-0 SQ vicryl. A dressing of Xeroform,  4 x 4, dressing sponges, Webril, and Ace wrap applied. The patient  awakened, and taken to recovery room without difficulty.   Jaycelynn Knickerbocker J 10/10/2015, 1:08 PM

## 2015-10-10 NOTE — Discharge Instructions (Signed)

## 2015-10-10 NOTE — Anesthesia Procedure Notes (Addendum)
Procedure Name: MAC Date/Time: 10/10/2015 11:43 AM Performed by: Eligha Bridegroom Pre-anesthesia Checklist: Patient identified, Timeout performed, Emergency Drugs available, Suction available and Patient being monitored Patient Re-evaluated:Patient Re-evaluated prior to inductionOxygen Delivery Method: Simple face mask Preoxygenation: Pre-oxygenation with 100% oxygen Intubation Type: IV induction   Spinal Patient location during procedure: OR Staffing Anesthesiologist: Arial Galligan Preanesthetic Checklist Completed: patient identified, surgical consent, pre-op evaluation, timeout performed, IV checked, risks and benefits discussed and monitors and equipment checked Spinal Block Patient position: sitting Prep: site prepped and draped and DuraPrep Patient monitoring: heart rate, cardiac monitor, continuous pulse ox and blood pressure Approach: midline Location: L3-4 Injection technique: single-shot Needle Needle type: Pencan  Needle gauge: 24 G Needle length: 10 cm Assessment Sensory level: T6

## 2015-10-10 NOTE — Progress Notes (Signed)
Orthopedic Tech Progress Note Patient Details:  [name] 1965-12-21 [id number]  CPM Right Knee CPM Right Knee: On Right Knee Flexion (Degrees): 40 Right Knee Extension (Degrees): 0 Additional Comments: trapeze bar patient helper Viewed order from doctor's order list  Hildred Priest 10/10/2015, 3:14 PM

## 2015-10-10 NOTE — Transfer of Care (Signed)
Immediate Anesthesia Transfer of Care Note  Patient: [name]  Procedure(s) Performed: Procedure(s): TOTAL RIGHT KNEE ARTHROPLASTY (Right)  Patient Location: PACU  Anesthesia Type:MAC and Spinal  Level of Consciousness: awake, alert  and oriented  Airway & Oxygen Therapy: Patient Spontanous Breathing and Patient connected to nasal cannula oxygen  Post-op Assessment: Report given to RN and Post -op Vital signs reviewed and stable  Post vital signs: Reviewed and stable  Last Vitals:  Filed Vitals:   10/10/15 1047  BP: 155/84  Pulse: 89  Temp: 36.3 C  Resp: 20    Complications: No apparent anesthesia complications

## 2015-10-10 NOTE — Anesthesia Preprocedure Evaluation (Signed)
Anesthesia Evaluation  Patient identified by MRN, date of birth, ID band Patient awake    Reviewed: Allergy & Precautions, NPO status , Patient's Chart, lab work & pertinent test results  History of Anesthesia Complications Negative for: history of anesthetic complications  Airway Mallampati: II  TM Distance: >3 FB Neck ROM: Full    Dental  (+) Teeth Intact   Pulmonary neg pulmonary ROS,    breath sounds clear to auscultation       Cardiovascular hypertension, Pt. on medications (-) angina(-) Past MI and (-) CHF  Rhythm:Regular     Neuro/Psych negative neurological ROS  negative psych ROS   GI/Hepatic Neg liver ROS, GERD  Medicated and Controlled,  Endo/Other  Morbid obesity  Renal/GU negative Renal ROS     Musculoskeletal  (+) Arthritis ,   Abdominal   Peds  Hematology Thrombocytosis    Anesthesia Other Findings   Reproductive/Obstetrics                             Anesthesia Physical Anesthesia Plan  ASA: II  Anesthesia Plan: MAC and Spinal   Post-op Pain Management:    Induction:   Airway Management Planned: Natural Airway, Nasal Cannula and Simple Face Mask  Additional Equipment: None  Intra-op Plan:   Post-operative Plan:   Informed Consent: I have reviewed the patients History and Physical, chart, labs and discussed the procedure including the risks, benefits and alternatives for the proposed anesthesia with the patient or authorized representative who has indicated his/her understanding and acceptance.   Dental advisory given  Plan Discussed with: Surgeon  Anesthesia Plan Comments:         Anesthesia Quick Evaluation

## 2015-10-11 ENCOUNTER — Encounter (HOSPITAL_COMMUNITY): Payer: Self-pay | Admitting: Orthopedic Surgery

## 2015-10-11 LAB — CBC
HEMATOCRIT: 32.1 % — AB (ref 36.0–46.0)
HEMOGLOBIN: 9.5 g/dL — AB (ref 12.0–15.0)
MCH: 22.5 pg — ABNORMAL LOW (ref 26.0–34.0)
MCHC: 29.6 g/dL — ABNORMAL LOW (ref 30.0–36.0)
MCV: 76.1 fL — AB (ref 78.0–100.0)
PLATELETS: 757 10*3/uL — AB (ref 150–400)
RBC: 4.22 MIL/uL (ref 3.87–5.11)
RDW: 16.9 % — ABNORMAL HIGH (ref 11.5–15.5)
WBC: 15.4 10*3/uL — AB (ref 4.0–10.5)

## 2015-10-11 LAB — BASIC METABOLIC PANEL
Anion gap: 9 (ref 5–15)
BUN: 6 mg/dL (ref 6–20)
CHLORIDE: 100 mmol/L — AB (ref 101–111)
CO2: 24 mmol/L (ref 22–32)
CREATININE: 0.74 mg/dL (ref 0.44–1.00)
Calcium: 8.4 mg/dL — ABNORMAL LOW (ref 8.9–10.3)
GFR calc non Af Amer: >60 mL/min (ref >60)
GLUCOSE: 183 mg/dL — AB (ref 65–99)
Potassium: 3.8 mmol/L (ref 3.5–5.1)
Sodium: 133 mmol/L — ABNORMAL LOW (ref 135–145)

## 2015-10-11 NOTE — Evaluation (Signed)
Physical Therapy Evaluation Patient Details Name: [name] MRN: [id number] DOB: 03-28-1966 Today's Date: 10/11/2015   History of Present Illness  Pt admitted for R TKA PMHx: GERD, essential HTN  Clinical Impression  Pt was eager to get OOB and perform exercise. She c/o mild nausea upon arrival that persisted throughout the evaluation. Pt presents with decreased R LE strength and ROM. Education provided on HEP, transferring only with staff, use of CPM and extension foam, and POC. Pt will benefit from acute PT to improve ROM and strength, safety with transfers and ambulation to decrease burden of care. D/C to home with HHPT is most appropriate at this time.      Follow Up Recommendations Home health PT    Equipment Recommendations  None recommended by PT    Recommendations for Other Services       Precautions / Restrictions Precautions Precautions: Knee Restrictions RLE Weight Bearing: Weight bearing as tolerated      Mobility  Bed Mobility Overal bed mobility: Needs Assistance Bed Mobility: Supine to Sit     Supine to sit: Supervision     General bed mobility comments: cues for sequence with using LLE to assist RLE to EOB, rail and increased time  Transfers Overall transfer level: Needs assistance   Transfers: Sit to/from Stand Sit to Stand: Min guard         General transfer comment: cues for hand placement and safety  Ambulation/Gait Ambulation/Gait assistance: Min guard Ambulation Distance (Feet): 30 Feet Assistive device: Rolling walker (2 wheeled) Gait Pattern/deviations: Step-to pattern;Decreased stride length   Gait velocity interpretation: Below normal speed for age/gender General Gait Details: cues for RW use, sequence and WBAT  Stairs            Wheelchair Mobility    Modified Rankin (Stroke Patients Only)       Balance                                             Pertinent Vitals/Pain Pain Assessment:  0-10 Pain Score: 4  Pain Location: right knee and head feeling dizzy Pain Descriptors / Indicators: Aching Pain Intervention(s): Limited activity within patient's tolerance;Premedicated before session;Repositioned;Ice applied    Home Living Family/patient expects to be discharged to:: Private residence Living Arrangements: Children Available Help at Discharge: Family;Available 24 hours/day Type of Home: House Home Access: Stairs to enter   [employer] of Steps: 2 Home Layout: Two level;Able to live on main level with bedroom/bathroom Home Equipment: Gilford Rile - 2 wheels;Bedside commode;Shower seat      Prior Function Level of Independence: Independent               Hand Dominance        Extremity/Trunk Assessment                         Communication   Communication: No difficulties  Cognition Arousal/Alertness: Awake/alert Behavior During Therapy: WFL for tasks assessed/performed Overall Cognitive Status: Within Functional Limits for tasks assessed                      General Comments      Exercises Total Joint Exercises Ankle Circles/Pumps: AROM;Seated;Right;10 reps Quad Sets: AROM;Right;10 reps;Supine Heel Slides: AROM;Right;10 reps;Supine      Assessment/Plan    PT Assessment Patient needs continued PT services  PT Diagnosis Difficulty walking;Acute pain;Generalized weakness   PT Problem List Decreased strength;Decreased range of motion;Decreased activity tolerance;Decreased mobility;Pain;Decreased knowledge of use of DME;Decreased knowledge of precautions  PT Treatment Interventions DME instruction;Gait training;Stair training;Functional mobility training;Therapeutic activities;Therapeutic exercise;Patient/family education   PT Goals (Current goals can be found in the Care Plan section) Acute Rehab PT Goals Patient Stated Goal: return home PT Goal Formulation: With patient Time For Goal Achievement: 10/18/15 Potential to  Achieve Goals: Good    Frequency 7X/week   Barriers to discharge        Co-evaluation               End of Session Equipment Utilized During Treatment: Gait belt Activity Tolerance: Patient tolerated treatment well Patient left: in chair;with call bell/phone within reach Nurse Communication: Weight bearing status;Precautions;Mobility status         Time: 0720-0755 PT Time Calculation (min) (ACUTE ONLY): 35 min   Charges:   PT Evaluation $Initial PT Evaluation Tier I: 1 Procedure PT Treatments $Gait Training: 8-22 mins   PT G CodesHaynes Bast 10/20/2015, 9:07 AM Haynes Bast, SPT October 20, 2015 9:07 AM

## 2015-10-11 NOTE — Care Management Note (Signed)
Case Management Note  Patient Details  Name: [name] MRN: [id number] Date of Birth: 05/10/1966  Subjective/Objective:       S/p right total knee arthroplasty             Action/Plan: Spoke with patient about discharge plan. Patient thought she was set up with [facility]. Contacted [name] at [facility], they will not be bale to work with the patient due to caseload. Informed patient, she chose [facility]. Contacted [name] at [facility] and set up [place]. [employer] delivered CPM and 3N1 to patient's home, already had rolling walker. Patient stated that her family will be able to assist her after discharge.       Expected Discharge Date:                  Expected Discharge Plan:  [place]  In-House Referral:  NA  Discharge planning Services  CM Consult  Post Acute Care Choice:  Durable Medical Equipment, Home Health Choice offered to:  Patient  DME Arranged:  3-N-1, CPM DME Agency:  [employer]  HH Arranged:  [place] Agency:  [place]  Status of Service:  Completed, signed off  Medicare Important Message Given:    Date Medicare IM Given:    Medicare IM give by:    Date Additional Medicare IM Given:    Additional Medicare Important Message give by:     If discussed at [place] of Stay Meetings, dates discussed:    Additional Comments:  Nila Nephew, RN 10/11/2015, 11:48 AM

## 2015-10-11 NOTE — Anesthesia Postprocedure Evaluation (Signed)
Anesthesia Post Note  Patient: [name]  Procedure(s) Performed: Procedure(s) (LRB): TOTAL RIGHT KNEE ARTHROPLASTY (Right)  Patient location during evaluation: PACU Anesthesia Type: Spinal Level of consciousness: awake Pain management: pain level controlled Vital Signs Assessment: post-procedure vital signs reviewed and stable Respiratory status: spontaneous breathing Cardiovascular status: stable Postop Assessment: no signs of nausea or vomiting Anesthetic complications: no    Last Vitals:  Filed Vitals:   10/11/15 0233 10/11/15 0549  BP: 138/77 132/69  Pulse: 98 90  Temp: 37.1 C 37.1 C  Resp: 16 18    Last Pain:  Filed Vitals:   10/11/15 0917  PainSc: 4                  Kendi Defalco

## 2015-10-11 NOTE — Progress Notes (Signed)
Utilization review completed.  

## 2015-10-11 NOTE — Progress Notes (Signed)
Patient ID: [name], female   DOB: Feb 27, 1966, 49 y.o.   MRN: [id number] PATIENT ID: [name]  MRN: [id number]  DOB/AGE:  1966-05-11 / 49 y.o.  1 Day Post-Op Procedure(s) (LRB): TOTAL RIGHT KNEE ARTHROPLASTY (Right)    PROGRESS NOTE Subjective: Patient is alert, oriented, 2x Nausea, no Vomiting, yes passing gas. Taking PO sips. Denies SOB, Chest or Calf Pain. Using Incentive Spirometer, PAS in place. Ambulate in hallway, CPM 0-40 Patient reports pain as 5/10 .    Objective: Vital signs in last 24 hours: Filed Vitals:   10/10/15 2041 10/11/15 0233 10/11/15 0549 10/11/15 0747  BP: 161/85 138/77 132/69   Pulse: 100 98 90   Temp: 98.6 F (37 C) 98.7 F (37.1 C) 98.7 F (37.1 C)   TempSrc: Oral Oral Oral   Resp: 18 16 18    Height:      Weight:      SpO2: 100% 98% 99% 96%      Intake/Output from previous day: I/O last 3 completed shifts: In: 2080 [P.O.:480; I.V.:1600] Out: 350 [Urine:300; Blood:50]   Intake/Output this shift:     LABORATORY DATA:  Recent Labs  10/11/15 0255  WBC 15.4*  HGB 9.5*  HCT 32.1*  PLT 757*  NA 133*  K 3.8  CL 100*  CO2 24  BUN 6  CREATININE 0.74  GLUCOSE 183*  CALCIUM 8.4*    Examination: Neurologically intact ABD soft Neurovascular intact Sensation intact distally Intact pulses distally Dorsiflexion/Plantar flexion intact Incision: dressing C/D/I No cellulitis present Compartment soft}  Assessment:   1 Day Post-Op Procedure(s) (LRB): TOTAL RIGHT KNEE ARTHROPLASTY (Right) ADDITIONAL DIAGNOSIS: Expected Acute Blood Loss Anemia, Hypertension  Plan: PT/OT WBAT, CPM 5/hrs day until ROM 0-90 degrees, then D/C CPM DVT Prophylaxis:  SCDx72hrs, ASA 325 mg BID x 2 weeks DISCHARGE PLAN: Home DISCHARGE NEEDS: HHPT, CPM, Walker and 3-in-1 comode seat     [name] J 10/11/2015, 8:04 AM

## 2015-10-11 NOTE — Progress Notes (Signed)
Physical Therapy Treatment Patient Details Name: [name] MRN: [id number] DOB: 1966-10-24 Today's Date: 10/11/2015    History of Present Illness Pt admitted for R TKA PMHx: GERD, essential HTN    PT Comments    Pt tolerated activity better during PM tx. She still c/o of mild lightheadedness occasionally during ambulation but it was not limiting. She was on 2L O2 upon arrival but SpO2 was 96% when on room air before and after activity so she remained on room air. Will continue to follow to improve LE strength, ROM, balance, and activity tolerance to decrease burden of care.    Follow Up Recommendations  Home health PT     Equipment Recommendations  None recommended by PT    Recommendations for Other Services       Precautions / Restrictions Precautions Precautions: Knee;Fall Restrictions Weight Bearing Restrictions: Yes RLE Weight Bearing: Weight bearing as tolerated    Mobility  Bed Mobility Overal bed mobility: Modified Independent Bed Mobility: Sit to Supine       Sit to supine: Modified independent (Device/Increase time)   General bed mobility comments: Cues for sequencing (hooking contralateral leg) sit to supine, use of rails. Increased time.   Transfers Overall transfer level: Needs assistance   Transfers: Sit to/from Stand Sit to Stand: Min guard         General transfer comment: Min guard for safety. Good recall of hand placement. VC to extend R LE for comfort when sitting. Sit to stand chair 2x, BSC x1  Ambulation/Gait Ambulation/Gait assistance: Min guard Ambulation Distance (Feet): 250 Feet Assistive device: Rolling walker (2 wheeled) Gait Pattern/deviations: Step-to pattern;Decreased stride length   Gait velocity interpretation: Below normal speed for age/gender General Gait Details: Pt had good recall of looking up. Needed min guard for safety. VC to heel strike and toe off.   Stairs Stairs: Yes Stairs assistance: Min guard Stair  Management: Sideways;One rail Right Number of Stairs: 2 General stair comments: Cues for sequencing, min guard for stability and safety. Increased time.   Wheelchair Mobility    Modified Rankin (Stroke Patients Only)       Balance Overall balance assessment: Needs assistance Sitting-balance support: Feet supported Sitting balance-Leahy Scale: Good     Standing balance support: During functional activity Standing balance-Leahy Scale: Fair                      Cognition Arousal/Alertness: Awake/alert Behavior During Therapy: WFL for tasks assessed/performed Overall Cognitive Status: Within Functional Limits for tasks assessed                      Exercises Total Joint Exercises Heel Slides: AROM;10 reps;Seated;Right Hip ABduction/ADduction: AROM;15 reps;Seated;Both Straight Leg Raises: AROM;10 reps;Seated;Right Long [employer]: AROM;15 reps;Seated;Both Goniometric ROM: 6-55    General Comments        Pertinent Vitals/Pain      Home Living                      Prior Function            PT Goals (current goals can now be found in the care plan section) Progress towards PT goals: Progressing toward goals    Frequency  7X/week    PT Plan      Co-evaluation             End of Session Equipment Utilized During Treatment: Gait belt Activity Tolerance: Patient tolerated treatment well Patient  left: in bed;with family/visitor present;in CPM;with call bell/phone within reach     Time: 1254-1345 PT Time Calculation (min) (ACUTE ONLY): 51 min  Charges:  $Gait Training: 23-37 mins $Therapeutic Exercise: 8-22 mins                    G CodesHaynes Bast 10/13/2015, 2:23 PM Haynes Bast, SPT October 13, 2015 2:23 PM

## 2015-10-11 NOTE — Evaluation (Signed)
Occupational Therapy Evaluation Patient Details Name: [name] MRN: [id number] DOB: 1966-03-15 Today's Date: 10/11/2015    History of Present Illness Pt admitted for R TKA PMHx: GERD, essential HTN   Clinical Impression   Pt reports she was independent with ADLs and mobility PTA. Currently pt is overall min guard for ADLs and functional mobility with the exception of min assist for LB ADLs. Pt reported slight dizziness with functional mobility but quickly resolved when coming to sitting. All education complete; pt and family with no further questions or concerns for OT at this time. Pt planning to d/c home with 24/7 supervision from family. Pt ready to d/c from an OT standpoint; signing off at this time. Thank you for this referral.     Follow Up Recommendations  No OT follow up;Supervision - Intermittent    Equipment Recommendations  None recommended by OT    Recommendations for Other Services       Precautions / Restrictions Precautions Precautions: Knee;Fall Restrictions Weight Bearing Restrictions: Yes RLE Weight Bearing: Weight bearing as tolerated      Mobility Bed Mobility Overal bed mobility: Needs Assistance Bed Mobility: Supine to Sit     Supine to sit: Supervision     General bed mobility comments: Pt OOB in chair   Transfers Overall transfer level: Needs assistance Equipment used: Rolling walker (2 wheeled) Transfers: Sit to/from Stand Sit to Stand: Min guard         General transfer comment: Min guard for safety; no physical assist needed. Good hand placement and technique. Sit to stand from chair x 1, BSC x 1    Balance Overall balance assessment: Needs assistance Sitting-balance support: Feet supported Sitting balance-Leahy Scale: Good     Standing balance support: During functional activity Standing balance-Leahy Scale: Fair                              ADL Overall ADL's : Needs assistance/impaired Eating/Feeding: Set  up;Sitting   Grooming: Wash/dry hands;Min guard;Standing       Lower Body Bathing: Minimal assistance;Sit to/from stand       Lower Body Dressing: Minimal assistance;Sit to/from stand Lower Body Dressing Details (indicate cue type and reason): Pt unable to reach R foot. Pt reports family will be available to assist with ADLs as needed. Educated on compensatory strategies for LB ADLs; pt verbalized understanding.  Toilet Transfer: Min guard;Ambulation;BSC;RW (BSC over toilet)   Toileting- Clothing Manipulation and Hygiene: Min guard;Sit to/from stand (for toilet hygiene and clothing manipulation)   Tub/ Shower Transfer: Min guard;Walk-in shower;Ambulation;Shower Technical sales engineer Details (indicate cue type and reason): Educated on walk in shower transfer technique; pt able to return demonstrate understanding. Provided with walk in shower transfer handout. Functional mobility during ADLs: Min guard;Rolling walker General ADL Comments: Pts daughters present for OT eval. Educated pt on home safety, need for supervision during ADLs and mobility, edema management techniques; pt verbalized understanding. Pt with slight dizziness with functional mobility; resolved in sitting. SpO2=96 on RA following ambulation. Nasal cannula reapplied with pt in sitting.     Vision     Perception     Praxis      Pertinent Vitals/Pain Pain Assessment: 0-10 Pain Score: 6  Pain Location: R knee and dizziness with mobility  Pain Descriptors / Indicators: Aching Pain Intervention(s): Limited activity within patient's tolerance;Monitored during session;Repositioned;Ice applied     Hand Dominance     Extremity/Trunk Assessment Upper  Extremity Assessment Upper Extremity Assessment: Overall WFL for tasks assessed   Lower Extremity Assessment Lower Extremity Assessment: Defer to PT evaluation   Cervical / Trunk Assessment Cervical / Trunk Assessment: Normal   Communication  Communication Communication: No difficulties   Cognition Arousal/Alertness: Awake/alert Behavior During Therapy: WFL for tasks assessed/performed Overall Cognitive Status: Within Functional Limits for tasks assessed                     General Comments       Exercises       Shoulder Instructions      Home Living Family/patient expects to be discharged to:: Private residence Living Arrangements: Children Available Help at Discharge: Family;Available 24 hours/day Type of Home: House Home Access: Stairs to enter [employer] of Steps: 2   Home Layout: Two level;Able to live on main level with bedroom/bathroom     Bathroom Shower/Tub: Occupational psychologist: Standard Bathroom Accessibility: Yes How Accessible: Accessible via walker Home Equipment: [place] - 2 wheels;Bedside commode;Shower seat          Prior Functioning/Environment Level of Independence: Independent             OT Diagnosis: Acute pain   OT Problem List:     OT Treatment/Interventions:      OT Goals(Current goals can be found in the care plan section) Acute Rehab OT Goals Patient Stated Goal: to go home OT Goal Formulation: With patient  OT Frequency:     Barriers to D/C:            Co-evaluation              End of Session Equipment Utilized During Treatment: Gait belt;Rolling walker;Oxygen CPM Right Knee CPM Right Knee: Off  Activity Tolerance: Patient tolerated treatment well Patient left: in chair;with call bell/phone within reach;with family/visitor present   Time: [id number] OT Time Calculation (min): 25 min Charges:  OT General Charges $OT Visit: 1 Procedure OT Evaluation $Initial OT Evaluation Tier I: 1 Procedure OT Treatments $Self Care/Home Management : 8-22 mins G-Codes:     Binnie Kand M.S., OTR/L Pager: [phone]  10/11/2015, 10:35 AM

## 2015-10-12 LAB — CBC
HCT: 28.6 % — ABNORMAL LOW (ref 36.0–46.0)
Hemoglobin: 8.5 g/dL — ABNORMAL LOW (ref 12.0–15.0)
MCH: 22.7 pg — AB (ref 26.0–34.0)
MCHC: 29.7 g/dL — AB (ref 30.0–36.0)
MCV: 76.5 fL — ABNORMAL LOW (ref 78.0–100.0)
PLATELETS: 683 10*3/uL — AB (ref 150–400)
RBC: 3.74 MIL/uL — ABNORMAL LOW (ref 3.87–5.11)
RDW: 17 % — AB (ref 11.5–15.5)
WBC: 11.2 10*3/uL — ABNORMAL HIGH (ref 4.0–10.5)

## 2015-10-12 NOTE — Progress Notes (Signed)
Physical Therapy Treatment Patient Details Name: [name] MRN: [id number] DOB: 08-22-1966 Today's Date: 10/12/2015    History of Present Illness Pt admitted for R TKA PMHx: GERD, essential HTN    PT Comments    Patient progressing well toward mobility goals with ability to ambulate 274ft with improved gait pattern and overall mobility level of supervision/mod I this session. Pt will continue to benefit from skilled PT for increased ROM, independence, and safety with mobility. Current plan remains appropriate.  Follow Up Recommendations  Home health PT     Equipment Recommendations  None recommended by PT    Recommendations for Other Services       Precautions / Restrictions Precautions Precautions: Knee;Fall Restrictions Weight Bearing Restrictions: Yes RLE Weight Bearing: Weight bearing as tolerated    Mobility  Bed Mobility Overal bed mobility: Modified Independent Bed Mobility: Sit to Supine     Supine to sit: Supervision     General bed mobility comments: uses bed rail with HOB elevated 20 degrees; use of LLE to assist RLE to EOB  Transfers Overall transfer level: Needs assistance Equipment used: Rolling walker (2 wheeled) Transfers: Sit to/from Stand Sit to Stand: Supervision         General transfer comment: good hand placement and technnique; supervision for safety  Ambulation/Gait Ambulation/Gait assistance: Supervision Ambulation Distance (Feet): 200 Feet Assistive device: Rolling walker (2 wheeled) Gait Pattern/deviations: Step-to pattern;Decreased step length - left   Gait velocity interpretation: Below normal speed for age/gender General Gait Details: cues for increased Rt knee flexion at toe off and initial swing phase and increased WB on Rt LE for improved bilat step length; ability to ambulate with step through pattern and with upright posture after initial vc   Stairs            Wheelchair Mobility    Modified Rankin (Stroke  Patients Only)       Balance Overall balance assessment: Needs assistance Sitting-balance support: Feet supported Sitting balance-Leahy Scale: Good     Standing balance support: During functional activity Standing balance-Leahy Scale: Good                      Cognition                            Exercises Total Joint Exercises Ankle Circles/Pumps: AROM;Both;10 reps Quad Sets: AROM;Right;10 reps Heel Slides: AROM;Right;10 reps Knee Flexion: AROM;Right;10 reps;Seated Goniometric ROM: 3-50    General Comments        Pertinent Vitals/Pain Pain Assessment: No/denies pain Pain Location: R knee Pain Descriptors / Indicators: Discomfort Pain Intervention(s): Limited activity within patient's tolerance;Monitored during session;Patient requesting pain meds-RN notified    Home Living                      Prior Function            PT Goals (current goals can now be found in the care plan section) Acute Rehab PT Goals Patient Stated Goal: go home Progress towards PT goals: Progressing toward goals    Frequency  7X/week    PT Plan Current plan remains appropriate    Co-evaluation             End of Session Equipment Utilized During Treatment: Gait belt Activity Tolerance: Patient tolerated treatment well Patient left: with family/visitor present;in CPM;with call bell/phone within reach;in chair     Time: 1354-1430 PT  Time Calculation (min) (ACUTE ONLY): 36 min  Charges:  $Gait Training: 8-22 mins $Therapeutic Exercise: 23-37 mins                    G Codes:      Salina April, PTA Pager: [phone]   10/12/2015, 2:42 PM

## 2015-10-12 NOTE — Progress Notes (Signed)
PATIENT ID: [name]  MRN: [id number]  DOB/AGE:  [birth year] / 49 y.o.  2 Days Post-Op Procedure(s) (LRB): TOTAL RIGHT KNEE ARTHROPLASTY (Right)    PROGRESS NOTE Subjective: Patient is alert, oriented, no Nausea, no Vomiting, yes passing gas. Taking PO well. Denies SOB, Chest or Calf Pain. Using Incentive Spirometer, PAS in place. Ambulate WBAT with pt practicing steps and walking 250 ft., CPM 0-40 Patient reports pain as mild to moderate.    Objective: Vital signs in last 24 hours: Filed Vitals:   10/11/15 0747 10/11/15 1251 10/11/15 2040 10/12/15 0603  BP:  152/71 138/83 130/65  Pulse:  88 94 91  Temp:  98.4 F (36.9 C) 99 F (37.2 C) 98 F (36.7 C)  TempSrc:   Oral Oral  Resp:  18 18 16   Height:      Weight:      SpO2: 96% 99% 100% 97%      Intake/Output from previous day: I/O last 3 completed shifts: In: 1440 [P.O.:1440] Out: -    Intake/Output this shift:     LABORATORY DATA:  Recent Labs  10/11/15 0255 10/12/15 0540  WBC 15.4* 11.2*  HGB 9.5* 8.5*  HCT 32.1* 28.6*  PLT 757* 683*  NA 133*  --   K 3.8  --   CL 100*  --   CO2 24  --   BUN 6  --   CREATININE 0.74  --   GLUCOSE 183*  --   CALCIUM 8.4*  --     Examination: Neurologically intact Neurovascular intact Sensation intact distally Intact pulses distally Dorsiflexion/Plantar flexion intact Incision: dressing C/D/I No cellulitis present Compartment soft}  Assessment:   2 Days Post-Op Procedure(s) (LRB): TOTAL RIGHT KNEE ARTHROPLASTY (Right) ADDITIONAL DIAGNOSIS: Expected Acute Blood Loss Anemia, Hypertension  Plan: PT/OT WBAT, CPM 5/hrs day until ROM 0-90 degrees, then D/C CPM DVT Prophylaxis:  SCDx72hrs, ASA 325 mg BID x 2 weeks DISCHARGE PLAN: Home DISCHARGE NEEDS: HHPT, CPM, Walker and 3-in-1 comode seat     Yumi Insalaco R 10/12/2015, 8:45 AM

## 2015-10-12 NOTE — Discharge Summary (Signed)
Patient ID: [name] MRN: [id number] DOB/AGE: 1966/04/29 49 y.o.  Admit date: 10/10/2015 Discharge date: 10/12/2015  Admission Diagnoses:  Principal Problem:   Primary osteoarthritis of right knee Active Problems:   Arthritis of knee   Discharge Diagnoses:  Same  Past Medical History  Diagnosis Date  . FIBROIDS, UTERUS   . GERD   . HYPERTENSION   . Obesity, unspecified   . Other abnormal glucose   . THROMBOCYTHEMIA   . NSVD (normal spontaneous vaginal delivery)     X3  . Diverticulosis of colon (without mention of hemorrhage)   . Diabetes mellitus     GESTATIONAL  . Arthritis     Surgeries: Procedure(s): TOTAL RIGHT KNEE ARTHROPLASTY on 10/10/2015   Consultants:    Discharged Condition: Improved  Hospital Course: [name] is an 49 y.o. female who was admitted 10/10/2015 for operative treatment ofPrimary osteoarthritis of right knee. Patient has severe unremitting pain that affects sleep, daily activities, and work/hobbies. After pre-op clearance the patient was taken to the operating room on 10/10/2015 and underwent  Procedure(s): TOTAL RIGHT KNEE ARTHROPLASTY.    Patient was given perioperative antibiotics: Anti-infectives    Start     Dose/Rate Route Frequency Ordered Stop   10/10/15 1200  ceFAZolin (ANCEF) IVPB 2 g/50 mL premix     2 g 100 mL/hr over 30 Minutes Intravenous To ShortStay Surgical 10/09/15 0734 10/10/15 1145       Patient was given sequential compression devices, early ambulation, and chemoprophylaxis to prevent DVT.  Patient benefited maximally from hospital stay and there were no complications.    Recent vital signs: Patient Vitals for the past 24 hrs:  BP Temp Temp src Pulse Resp SpO2  10/12/15 0603 130/65 mmHg 98 F (36.7 C) Oral 91 16 97 %  10/11/15 2040 138/83 mmHg 99 F (37.2 C) Oral 94 18 100 %  10/11/15 1251 (!) 152/71 mmHg 98.4 F (36.9 C) - 88 18 99 %     Recent laboratory studies:  Recent Labs  10/11/15 0255  10/12/15 0540  WBC 15.4* 11.2*  HGB 9.5* 8.5*  HCT 32.1* 28.6*  PLT 757* 683*  NA 133*  --   K 3.8  --   CL 100*  --   CO2 24  --   BUN 6  --   CREATININE 0.74  --   GLUCOSE 183*  --   CALCIUM 8.4*  --      Discharge Medications:     Medication List    STOP taking these medications        meloxicam 15 MG tablet  Commonly known as:  MOBIC      TAKE these medications        ALLERGY 10 MG tablet  Generic drug:  loratadine  Take 10 mg by mouth daily.     amLODipine 5 MG tablet  Commonly known as:  NORVASC  Take 1 tablet (5 mg total) by mouth daily.     aspirin 81 MG tablet  Take 81 mg by mouth daily.     aspirin EC 325 MG tablet  Take 1 tablet (325 mg total) by mouth 2 (two) times daily.     benazepril 10 MG tablet  Commonly known as:  LOTENSIN  Take 1 tablet (10 mg total) by mouth daily.     calcium carbonate 600 MG Tabs tablet  Commonly known as:  OS-CAL  Take 600 mg by mouth 2 (two) times daily with a meal.  ibuprofen 200 MG tablet  Commonly known as:  ADVIL,MOTRIN  Take 200 mg by mouth every 6 (six) hours as needed for pain.     methocarbamol 500 MG tablet  Commonly known as:  ROBAXIN  Take 1 tablet (500 mg total) by mouth 2 (two) times daily with a meal.     nystatin-triamcinolone cream  Commonly known as:  MYCOLOG II  APPLY TOPICALLY 3 TIMES A DAY     oxyCODONE-acetaminophen 5-325 MG tablet  Commonly known as:  ROXICET  Take 1 tablet by mouth every 4 (four) hours as needed.     pantoprazole 40 MG tablet  Commonly known as:  PROTONIX  TAKE 1 TABLET BY MOUTH ONCE DAILY        Diagnostic Studies: Dg Chest 2 View  10/01/2015  CLINICAL DATA:  Preoperative exam prior to right total knee joint replacement. History of hypertension, diabetes, obesity. EXAM: CHEST  2 VIEW COMPARISON:  None in PACs FINDINGS: The lungs are adequately inflated. There is no focal infiltrate. There is no pleural effusion. The heart and pulmonary vascularity are  normal. The mediastinum is normal in width. There is no pleural effusion. The observed bony thorax is unremarkable. IMPRESSION: There is no active cardiopulmonary disease. Electronically Signed   By: David  Martinique M.D.   On: 10/01/2015 15:49   US Pelvis Complete  10/04/2015  Ultrasound: Uterus measured 8.3 x 6.6 x 5.8 cm with endometrial stripe of 13.7 mm the cervix was cleansed with Betadine solution and a sterile catheter was introduced into the uterine cavity and no intracavitary defect was noted. The right ovary demonstrated a thin wall cyst with septation measuring 26 x 25 x 28 mm with negative color flow. Blood flow was seen to the right ovary otherwise. Left ovary was enlarged multiseptated cystic mass with echo-free areas measuring 5.7 x 5.7 x 5.6 cm and a reticular echo pattern additional cyst measuring 4.1 x 4.0 with arterial blood flow to the ovarian tissue but negative color flow on both masses. There was no fluid in the cul-de-sac.   [place] Sonohysterogram  10/04/2015  Ultrasound: Uterus measured 8.3 x 6.6 x 5.8 cm with endometrial stripe of 13.7 mm the cervix was cleansed with Betadine solution and a sterile catheter was introduced into the uterine cavity and no intracavitary defect was noted. The right ovary demonstrated a thin wall cyst with septation measuring 26 x 25 x 28 mm with negative color flow. Blood flow was seen to the right ovary otherwise. Left ovary was enlarged multiseptated cystic mass with echo-free areas measuring 5.7 x 5.7 x 5.6 cm and a reticular echo pattern additional cyst measuring 4.1 x 4.0 with arterial blood flow to the ovarian tissue but negative color flow on both masses. There was no fluid in the cul-de-sac.   [place] Art/ven Flow Abd Pelv Doppler Limited  10/04/2015  Ultrasound: Uterus measured 8.3 x 6.6 x 5.8 cm with endometrial stripe of 13.7 mm the cervix was cleansed with Betadine solution and a sterile catheter was introduced into the uterine cavity and no  intracavitary defect was noted. The right ovary demonstrated a thin wall cyst with septation measuring 26 x 25 x 28 mm with negative color flow. Blood flow was seen to the right ovary otherwise. Left ovary was enlarged multiseptated cystic mass with echo-free areas measuring 5.7 x 5.7 x 5.6 cm and a reticular echo pattern additional cyst measuring 4.1 x 4.0 with arterial blood flow to the ovarian tissue but negative color flow on  both masses. There was no fluid in the cul-de-sac.    Disposition: Final discharge disposition not confirmed      Discharge Instructions    CPM    Complete by:  As directed   Continuous passive motion machine (CPM):      Use the CPM from 0 to 60  for 5 hours per day.      You may increase by 10 degrees per day.  You may break it up into 2 or 3 sessions per day.      Use CPM for 2 weeks or until you are told to stop.     Call MD / Call 911    Complete by:  As directed   If you experience chest pain or shortness of breath, CALL 911 and be transported to the hospital emergency room.  If you develope a fever above 101 F, pus (white drainage) or increased drainage or redness at the wound, or calf pain, call your surgeon's office.     Change dressing    Complete by:  As directed   Change dressing on 5, then change the dressing daily with sterile 4 x 4 inch gauze dressing and apply TED hose.  You may clean the incision with alcohol prior to redressing.     Constipation Prevention    Complete by:  As directed   Drink plenty of fluids.  Prune juice may be helpful.  You may use a stool softener, such as Colace (over the counter) 100 mg twice a day.  Use MiraLax (over the counter) for constipation as needed.     Diet - low sodium heart healthy    Complete by:  As directed      Driving restrictions    Complete by:  As directed   No driving for 2 weeks     Increase activity slowly as tolerated    Complete by:  As directed      Patient may shower    Complete by:  As  directed   You may shower without a dressing once there is no drainage.  Do not wash over the wound.  If drainage remains, cover wound with plastic wrap and then shower.           Follow-up Information    Follow up with Kerin Salen, MD In 2 weeks.   Specialty:  Orthopedic Surgery   Contact information:   [place] [address] [phone]       Follow up with [facility].   Why:  They will contact you to schedule home therapy visits.    Contact information:   [address] [place] [address] [phone]        Signed: Theodosia Quay 10/12/2015, 8:48 AM

## 2015-10-12 NOTE — Progress Notes (Signed)
Pt ready for discharge. Discharge instructions/education reviewed and all questions/concerns addressed. Will be transported out via wheelchair. Will continue to monitor.

## 2015-11-06 DIAGNOSIS — M1711 Unilateral primary osteoarthritis, right knee: Secondary | ICD-10-CM | POA: Diagnosis not present

## 2015-11-09 DIAGNOSIS — M1711 Unilateral primary osteoarthritis, right knee: Secondary | ICD-10-CM | POA: Diagnosis not present

## 2015-11-09 MED FILL — OXYCODONE/APAP 5/325MG: 5-325 | 15 days supply | Qty: 60 | Fill #0

## 2015-11-12 DIAGNOSIS — M1711 Unilateral primary osteoarthritis, right knee: Secondary | ICD-10-CM | POA: Diagnosis not present

## 2015-11-13 ENCOUNTER — Other Ambulatory Visit: Payer: Self-pay | Admitting: Gynecology

## 2015-11-13 ENCOUNTER — Encounter: Payer: Self-pay | Admitting: Gynecology

## 2015-11-13 DIAGNOSIS — N83201 Unspecified ovarian cyst, right side: Secondary | ICD-10-CM

## 2015-11-13 DIAGNOSIS — N83202 Unspecified ovarian cyst, left side: Principal | ICD-10-CM

## 2015-11-14 DIAGNOSIS — M1711 Unilateral primary osteoarthritis, right knee: Secondary | ICD-10-CM | POA: Diagnosis not present

## 2015-11-16 DIAGNOSIS — M1711 Unilateral primary osteoarthritis, right knee: Secondary | ICD-10-CM | POA: Diagnosis not present

## 2015-11-19 DIAGNOSIS — M1711 Unilateral primary osteoarthritis, right knee: Secondary | ICD-10-CM | POA: Diagnosis not present

## 2015-11-20 DIAGNOSIS — M1711 Unilateral primary osteoarthritis, right knee: Secondary | ICD-10-CM | POA: Diagnosis not present

## 2015-11-22 DIAGNOSIS — M1711 Unilateral primary osteoarthritis, right knee: Secondary | ICD-10-CM | POA: Diagnosis not present

## 2015-11-26 DIAGNOSIS — M1711 Unilateral primary osteoarthritis, right knee: Secondary | ICD-10-CM | POA: Diagnosis not present

## 2015-11-28 MED FILL — AMOXICILLIN 500 MG CAPSULE: 500 | 5 days supply | Qty: 20 | Fill #0

## 2015-11-30 DIAGNOSIS — M1711 Unilateral primary osteoarthritis, right knee: Secondary | ICD-10-CM | POA: Diagnosis not present

## 2015-12-03 DIAGNOSIS — M1711 Unilateral primary osteoarthritis, right knee: Secondary | ICD-10-CM | POA: Diagnosis not present

## 2015-12-06 ENCOUNTER — Telehealth: Payer: Self-pay

## 2015-12-06 NOTE — Telephone Encounter (Signed)
Patient is schedule for Lap BSO on 01/01/16.  She asked if she would be able to travel by car to [place] before the two weeks post op are up. She is thinking she would like to travel on March 6, 7, 8.

## 2015-12-07 DIAGNOSIS — M1711 Unilateral primary osteoarthritis, right knee: Secondary | ICD-10-CM | POA: Diagnosis not present

## 2015-12-07 NOTE — Telephone Encounter (Signed)
Pre DPR access I left detailed message on pt voicemail

## 2015-12-07 NOTE — Telephone Encounter (Signed)
Would not recommend it that soon. High risk for DVT/PE so soon after surgery and a long car ride.

## 2015-12-10 DIAGNOSIS — M1711 Unilateral primary osteoarthritis, right knee: Secondary | ICD-10-CM | POA: Diagnosis not present

## 2015-12-11 MED FILL — AMLODIPINE BESYLATE 5 MG TA: 5 | 90 days supply | Qty: 90 | Fill #3

## 2015-12-11 MED FILL — BENAZEPRIL HCL 10 MG TABLET: 10 | 90 days supply | Qty: 90 | Fill #3

## 2015-12-13 DIAGNOSIS — M1711 Unilateral primary osteoarthritis, right knee: Secondary | ICD-10-CM | POA: Diagnosis not present

## 2015-12-17 ENCOUNTER — Other Ambulatory Visit (INDEPENDENT_AMBULATORY_CARE_PROVIDER_SITE_OTHER): Payer: 59

## 2015-12-17 DIAGNOSIS — R7302 Impaired glucose tolerance (oral): Secondary | ICD-10-CM

## 2015-12-17 DIAGNOSIS — Z0000 Encounter for general adult medical examination without abnormal findings: Secondary | ICD-10-CM

## 2015-12-17 LAB — CBC WITH DIFFERENTIAL/PLATELET
BASOS PCT: 1.1 % (ref 0.0–3.0)
Basophils Absolute: 0.1 10*3/uL (ref 0.0–0.1)
EOS PCT: 4.8 % (ref 0.0–5.0)
Eosinophils Absolute: 0.4 10*3/uL (ref 0.0–0.7)
HEMATOCRIT: 33.8 % — AB (ref 36.0–46.0)
Hemoglobin: 10.4 g/dL — ABNORMAL LOW (ref 12.0–15.0)
LYMPHS ABS: 2 10*3/uL (ref 0.7–4.0)
LYMPHS PCT: 22 % (ref 12.0–46.0)
MCHC: 30.7 g/dL (ref 30.0–36.0)
MONOS PCT: 9.4 % (ref 3.0–12.0)
Monocytes Absolute: 0.8 10*3/uL (ref 0.1–1.0)
NEUTROS ABS: 5.6 10*3/uL (ref 1.4–7.7)
NEUTROS PCT: 62.7 % (ref 43.0–77.0)
PLATELETS: 787 10*3/uL — AB (ref 150.0–400.0)
RBC: 4.87 Mil/uL (ref 3.87–5.11)
RDW: 17.3 % — ABNORMAL HIGH (ref 11.5–15.5)
WBC: 9 10*3/uL (ref 4.0–10.5)

## 2015-12-17 LAB — LIPID PANEL
CHOL/HDL RATIO: 3
Cholesterol: 161 mg/dL (ref 0–200)
HDL: 48.1 mg/dL (ref >39.00)
LDL Cholesterol: 77 mg/dL (ref 0–99)
NONHDL: 113.37
Triglycerides: 181 mg/dL — ABNORMAL HIGH (ref 0.0–149.0)
VLDL: 36.2 mg/dL (ref 0.0–40.0)

## 2015-12-17 LAB — BASIC METABOLIC PANEL
BUN: 11 mg/dL (ref 6–23)
CALCIUM: 9.6 mg/dL (ref 8.4–10.5)
CO2: 28 meq/L (ref 19–32)
CREATININE: 0.84 mg/dL (ref 0.40–1.20)
Chloride: 102 mEq/L (ref 96–112)
GFR: 75.72 mL/min (ref >60.00)
Glucose, Bld: 116 mg/dL — ABNORMAL HIGH (ref 70–99)
Potassium: 4.4 mEq/L (ref 3.5–5.1)
SODIUM: 138 meq/L (ref 135–145)

## 2015-12-17 LAB — HEMOGLOBIN A1C: Hgb A1c MFr Bld: 6.4 % (ref 4.6–6.5)

## 2015-12-17 LAB — URINALYSIS, ROUTINE W REFLEX MICROSCOPIC
Bilirubin Urine: NEGATIVE
Hgb urine dipstick: NEGATIVE
Ketones, ur: NEGATIVE
Leukocytes, UA: NEGATIVE
Nitrite: NEGATIVE
RBC / HPF: NONE SEEN (ref 0–?)
Specific Gravity, Urine: 1.01 (ref 1.000–1.030)
Total Protein, Urine: NEGATIVE
Urine Glucose: NEGATIVE
Urobilinogen, UA: 0.2 (ref 0.0–1.0)
pH: 6 (ref 5.0–8.0)

## 2015-12-17 LAB — HEPATIC FUNCTION PANEL
ALK PHOS: 62 U/L (ref 39–117)
ALT: 16 U/L (ref 0–35)
AST: 14 U/L (ref 0–37)
Albumin: 4.4 g/dL (ref 3.5–5.2)
BILIRUBIN DIRECT: 0 mg/dL (ref 0.0–0.3)
BILIRUBIN TOTAL: 0.3 mg/dL (ref 0.2–1.2)
Total Protein: 7.8 g/dL (ref 6.0–8.3)

## 2015-12-17 LAB — TSH: TSH: 2.66 u[IU]/mL (ref 0.35–4.50)

## 2015-12-18 ENCOUNTER — Other Ambulatory Visit (INDEPENDENT_AMBULATORY_CARE_PROVIDER_SITE_OTHER): Payer: 59

## 2015-12-18 DIAGNOSIS — D509 Iron deficiency anemia, unspecified: Secondary | ICD-10-CM | POA: Diagnosis not present

## 2015-12-18 LAB — IBC PANEL
Iron: 15 ug/dL — ABNORMAL LOW (ref 42–145)
SATURATION RATIOS: 2.8 % — AB (ref 20.0–50.0)
Transferrin: 387 mg/dL — ABNORMAL HIGH (ref 212.0–360.0)

## 2015-12-19 ENCOUNTER — Encounter: Payer: 59 | Admitting: Internal Medicine

## 2015-12-21 DIAGNOSIS — M1711 Unilateral primary osteoarthritis, right knee: Secondary | ICD-10-CM | POA: Diagnosis not present

## 2015-12-21 NOTE — Patient Instructions (Addendum)
Your procedure is scheduled on:  Tuesday, Feb. 28, 2017  Enter through the [employer] [facility] at:  7:30 A.M.  Pick up the phone at the desk and dial [phone].  Call this number if you have problems the morning of surgery: [phone].  Remember:  Do NOT eat food or drink after: Midnight Monday  Take these medicines the morning of surgery with a SIP OF WATER: none   Do NOT wear jewelry (body piercing), metal hair clips/bobby pins, make-up, or nail polish. Do NOT wear lotions, powders, or perfumes.  You may wear deoderant. Do NOT shave for 48 hours prior to surgery. Do NOT bring valuables to the hospital. Contacts, dentures, or bridgework may not be worn into surgery.  Have a responsible adult drive you home and stay with you for 24 hours after your procedure

## 2015-12-24 ENCOUNTER — Encounter (HOSPITAL_COMMUNITY): Payer: Self-pay

## 2015-12-24 ENCOUNTER — Other Ambulatory Visit: Payer: Self-pay

## 2015-12-24 ENCOUNTER — Encounter (HOSPITAL_COMMUNITY)
Admission: RE | Admit: 2015-12-24 | Discharge: 2015-12-24 | Disposition: A | Discharge status: Home / Self Care | Payer: 59 | Source: Ambulatory Visit | Attending: Gynecology | Admitting: Gynecology

## 2015-12-24 ENCOUNTER — Encounter: Payer: Self-pay | Admitting: Gynecology

## 2015-12-24 ENCOUNTER — Ambulatory Visit (INDEPENDENT_AMBULATORY_CARE_PROVIDER_SITE_OTHER): Payer: 59 | Admitting: Gynecology

## 2015-12-24 ENCOUNTER — Ambulatory Visit (INDEPENDENT_AMBULATORY_CARE_PROVIDER_SITE_OTHER): Payer: 59

## 2015-12-24 VITALS — BP 146/88

## 2015-12-24 DIAGNOSIS — N83202 Unspecified ovarian cyst, left side: Secondary | ICD-10-CM

## 2015-12-24 DIAGNOSIS — Z79899 Other long term (current) drug therapy: Secondary | ICD-10-CM | POA: Insufficient documentation

## 2015-12-24 DIAGNOSIS — N83201 Unspecified ovarian cyst, right side: Secondary | ICD-10-CM | POA: Diagnosis not present

## 2015-12-24 DIAGNOSIS — Z01818 Encounter for other preprocedural examination: Secondary | ICD-10-CM | POA: Diagnosis not present

## 2015-12-24 DIAGNOSIS — I10 Essential (primary) hypertension: Secondary | ICD-10-CM | POA: Insufficient documentation

## 2015-12-24 DIAGNOSIS — Z01810 Encounter for preprocedural cardiovascular examination: Secondary | ICD-10-CM | POA: Insufficient documentation

## 2015-12-24 MED ORDER — OXYCODONE-ACETAMINOPHEN 5-325 MG PO TABS
1.0000 | ORAL_TABLET | ORAL | Status: DC | PRN
Start: 2015-12-24 — End: 2016-04-03

## 2015-12-24 NOTE — Progress Notes (Signed)
[name] is an 50 y.o. female who presents to the office for preoperative consultation. Patient was last seen the office in 10/04/2015 and her history is as follows:  Patient had an ultrasound and sonohysterogram as a result of her irregular vaginal bleeding that she reported time of her annual exam on 09/10/2015. At that office visit she had an endometrial biopsy which demonstrated the following:  Diagnosis Endometrium, biopsy, endocervix/cervix - ATROPHIC ENDOMETRIUM WITH BREAKDOWN IN A BACKGROUND OF BLOOD CLOT, RARE BENIGN ENDOCERVICAL CELLS AND SQUAMOUS EPITHELIAL CELLS. - NO ENDOMETRIAL HYPERPLASIA, ATYPIA, CERVICAL DYSPLASIA OR MALIGNANCY IDENTIFIED.  her Pap smear November 2016 was normal. Patient then on 10/04/2015 had an ultrasound which demonstrated the following: Uterus measured 8.3 x 6.6 x 5.8 cm with endometrial stripe of 13.7 mm the cervix was cleansed with Betadine solution and a sterile catheter was introduced into the uterine cavity and no intracavitary defect was noted. The right ovary demonstrated a thin wall cyst with septation measuring 26 x 25 x 28 mm with negative color flow. Blood flow was seen to the right ovary otherwise. Left ovary was enlarged multiseptated cystic mass with echo-free areas measuring 5.7 x 5.7 x 5.6 cm and a reticular echo pattern additional cyst measuring 4.1 x 4.0 with arterial blood flow to the ovarian tissue but negative color flow on both masses. There was no fluid in the cul-de-sac   Ultrasound today demonstrated the following: Uterus measured 10.2 x 5.9 x 5.6 cm with endometrial stripe of 8.8 mm. Right ovary normal previous has not seen. Left ovary with a thin wall echo-free cyst measuring 20 x 16 19 mm, a thick wall corpus luteum cyst measuring 13 x 10 mm was noted also continued presence of a thinwall cystic mass measuring 59 x 64 x 54 mm average size 59 mm. Echogenic debris was noted negative color flow. Arterial blood flow was seen to  left ovary. Previous reticular cystic mass not seen. No fluid in the cul-de-sac.  Patient had a knee replacement back in December. Review of her record also indicated that she has had history in the past of mild essential thrombocytosis and had been followed by Dr. Murriel Hopper who had recommend she be on baby aspirin. She last saw him back in 2011 and was released her from her care. She had been tested in the past for the JAK-2 mutation and was negative. Patient with past history resectoscopic myomectomy for prolapsing myoma several years ago. Patient reports the past 2 months her cycles of been normal.   On 10/04/2015 patient had an ovarians screening blood test ROMA-1 Which was normal    Pertinent Gynecological History: Menses: Normal Bleeding: Normal  Contraception: tubal ligation DES exposure: denies Blood transfusions: none Sexually transmitted diseases: no past history Previous GYN Procedures: 3 normal spontaneous vaginal deliveries resectoscopic myomectomy, tubal ligation  Last mammogram: normal Date: 2016st pap: normal Date: 3 OB History: G3 , P3   Menstrual History: Menarche age: 44  No LMP recorded.    Past Medical History  Diagnosis Date  . FIBROIDS, UTERUS   . GERD   . HYPERTENSION   . Obesity, unspecified   . Other abnormal glucose   . THROMBOCYTHEMIA   . NSVD (normal spontaneous vaginal delivery)     X3  . Diverticulosis of colon (without mention of hemorrhage)   . Diabetes mellitus     GESTATIONAL  . Arthritis     Past Surgical History  Procedure Laterality Date  . Knee arthroscopy Right 2006  Dr Ronnie Derby  . Resectoscopic polypectomy    . Hysteroscopy  06/19/2005    ASPIRATION OF LEFT PARATUBAL CYST  . Colonoscopy    . Tubal ligation  2006    FILSHIE CLIP TECHNIQUE  . Uterine fibroid surgery    . Total knee arthroplasty Right 10/10/2015    Procedure: TOTAL RIGHT KNEE ARTHROPLASTY;  Surgeon: Frederik Pear, MD;  Location: [place];  Service: Orthopedics;   Laterality: Right;    Family History  Problem Relation Age of Onset  . Breast cancer Mother   . Diabetes Father   . Hypertension Father   . Colon cancer Neg Hx   . Stomach cancer Neg Hx   . Rectal cancer Neg Hx     Social History:  reports that she has never smoked. She has never used smokeless tobacco. She reports that she drinks alcohol. She reports that she does not use illicit drugs.  Allergies: No Known Allergies   (Not in a hospital admission)  REVIEW OF SYSTEMS: A ROS was performed and pertinent positives and negatives are included in the history.  GENERAL: No fevers or chills. HEENT: No change in vision, no earache, sore throat or sinus congestion. NECK: No pain or stiffness. CARDIOVASCULAR: No chest pain or pressure. No palpitations. PULMONARY: No shortness of breath, cough or wheeze. GASTROINTESTINAL: No abdominal pain, nausea, vomiting or diarrhea, melena or bright red blood per rectum. GENITOURINARY: No urinary frequency, urgency, hesitancy or dysuria. MUSCULOSKELETAL: No joint or muscle pain, no back pain, no recent trauma. DERMATOLOGIC: No rash, no itching, no lesions. ENDOCRINE: No polyuria, polydipsia, no heat or cold intolerance. No recent change in weight. HEMATOLOGICAL: No anemia or easy bruising or bleeding. NEUROLOGIC: No headache, seizures, numbness, tingling or weakness. PSYCHIATRIC: No depression, no loss of interest in normal activity or change in sleep pattern.     Blood pressure 146/88.  Physical Exam:  HEENT:unremarkable Neck:Supple, midline, no thyroid megaly, no carotid bruits Lungs:  Clear to auscultation no rhonchi's or wheezes Heart:Regular rate and rhythm, no murmurs or gallops Breast Exam:Examine November 2016 Pelvic exam: Bartholin urethra Skene was within normal limits Vagina: No lesions or discharge Cervix: No lesions or discharge Uterus anteverted upper limits of normal Adnexa: No palpable masses Rectal exam: Not  done   Assessment/Plan: Patient with persistent left ovarian cyst is scheduled to undergo laparoscopic less salpingo-oophorectomy right salpingectomy. The risks benefits and pros and cons of the operation as follows:                        Patient was counseled as to the risk of surgery to include the following:  1. Infection (prohylactic antibiotics will be administered)  2. DVT/Pulmonary Embolism (prophylactic pneumo compression stockings will be used)  3.Trauma to internal organs requiring additional surgical procedure to repair any injury to     Internal organs requiring perhaps additional hospitalization days.  4.Hemmorhage requiring transfusion and blood products which carry risks such as             anaphylactic reaction, hepatitis and AIDS  Patient had received literature information on the procedure scheduled and all her questions were answered and fully accepts all risk.   [facility] HMD2:13 PMTD@Note :

## 2015-12-24 NOTE — Patient Instructions (Signed)
Ovarian Cyst An ovarian cyst is a fluid-filled sac that forms on an ovary. The ovaries are small organs that produce eggs in women. Various types of cysts can form on the ovaries. Most are not cancerous. Many do not cause problems, and they often go away on their own. Some may cause symptoms and require treatment. Common types of ovarian cysts include:  Functional cysts--These cysts may occur every month during the menstrual cycle. This is normal. The cysts usually go away with the next menstrual cycle if the woman does not get pregnant. Usually, there are no symptoms with a functional cyst.  Endometrioma cysts--These cysts form from the tissue that lines the uterus. They are also called "chocolate cysts" because they become filled with blood that turns brown. This type of cyst can cause pain in the lower abdomen during intercourse and with your menstrual period.  Cystadenoma cysts--This type develops from the cells on the outside of the ovary. These cysts can get very big and cause lower abdomen pain and pain with intercourse. This type of cyst can twist on itself, cut off its blood supply, and cause severe pain. It can also easily rupture and cause a lot of pain.  Dermoid cysts--This type of cyst is sometimes found in both ovaries. These cysts may contain different kinds of body tissue, such as skin, teeth, hair, or cartilage. They usually do not cause symptoms unless they get very big.  Theca lutein cysts--These cysts occur when too much of a certain hormone (human chorionic gonadotropin) is produced and overstimulates the ovaries to produce an egg. This is most common after procedures used to assist with the conception of a baby (in vitro fertilization). CAUSES   Fertility drugs can cause a condition in which multiple large cysts are formed on the ovaries. This is called ovarian hyperstimulation syndrome.  A condition called polycystic ovary syndrome can cause hormonal imbalances that can lead to  nonfunctional ovarian cysts. SIGNS AND SYMPTOMS  Many ovarian cysts do not cause symptoms. If symptoms are present, they may include:  Pelvic pain or pressure.  Pain in the lower abdomen.  Pain during sexual intercourse.  Increasing girth (swelling) of the abdomen.  Abnormal menstrual periods.  Increasing pain with menstrual periods.  Stopping having menstrual periods without being pregnant. DIAGNOSIS  These cysts are commonly found during a routine or annual pelvic exam. Tests may be ordered to find out more about the cyst. These tests may include:  Ultrasound.  X-ray of the pelvis.  CT scan.  MRI.  Blood tests. TREATMENT  Many ovarian cysts go away on their own without treatment. Your health care provider may want to check your cyst regularly for 2-3 months to see if it changes. For women in menopause, it is particularly important to monitor a cyst closely because of the higher rate of ovarian cancer in menopausal women. When treatment is needed, it may include any of the following:  A procedure to drain the cyst (aspiration). This may be done using a long needle and ultrasound. It can also be done through a laparoscopic procedure. This involves using a thin, lighted tube with a tiny camera on the end (laparoscope) inserted through a small incision.  Surgery to remove the whole cyst. This may be done using laparoscopic surgery or an open surgery involving a larger incision in the lower abdomen.  Hormone treatment or birth control pills. These methods are sometimes used to help dissolve a cyst. HOME CARE INSTRUCTIONS   Only take over-the-counter   or prescription medicines as directed by your health care provider.  Follow up with your health care provider as directed.  Get regular pelvic exams and Pap tests. SEEK MEDICAL CARE IF:   Your periods are late, irregular, or painful, or they stop.  Your pelvic pain or abdominal pain does not go away.  Your abdomen becomes  larger or swollen.  You have pressure on your bladder or trouble emptying your bladder completely.  You have pain during sexual intercourse.  You have feelings of fullness, pressure, or discomfort in your stomach.  You lose weight for no apparent reason.  You feel generally ill.  You become constipated.  You lose your appetite.  You develop acne.  You have an increase in body and facial hair.  You are gaining weight, without changing your exercise and eating habits.  You think you are pregnant. SEEK IMMEDIATE MEDICAL CARE IF:   You have increasing abdominal pain.  You feel sick to your stomach (nauseous), and you throw up (vomit).  You develop a fever that comes on suddenly.  You have abdominal pain during a bowel movement.  Your menstrual periods become heavier than usual. MAKE SURE YOU:  Understand these instructions.  Will watch your condition.  Will get help right away if you are not doing well or get worse.   This information is not intended to replace advice given to you by your health care provider. Make sure you discuss any questions you have with your health care provider.   Document Released: 10/20/2005 Document Revised: 10/25/2013 Document Reviewed: 06/27/2013 Elsevier Interactive Patient Education 2016 [employer] Diagnostic Laparoscopy A diagnostic laparoscopy is a procedure to diagnose diseases in the abdomen. During the procedure, a thin, lighted, pencil-sized instrument called a laparoscope is inserted into the abdomen through an incision. The laparoscope allows your health care provider to look at the organs inside your body. LET [facility] CARE PROVIDER KNOW ABOUT:  Any allergies you have.  All medicines you are taking, including vitamins, herbs, eye drops, creams, and over-the-counter medicines.  Previous problems you or members of your family have had with the use of anesthetics.  Any blood disorders you have.  Previous surgeries you  have had.  Medical conditions you have. RISKS AND COMPLICATIONS  Generally, this is a safe procedure. However, problems can occur, which may include:  Infection.  Bleeding.  Damage to other organs.  Allergic reaction to the anesthetics used during the procedure. BEFORE THE PROCEDURE  Do not eat or drink anything after midnight on the night before the procedure or as directed by your health care provider.  Ask your health care provider about:  Changing or stopping your regular medicines.  Taking medicines such as aspirin and ibuprofen. These medicines can thin your blood. Do not take these medicines before your procedure if your health care provider instructs you not to.  Plan to have someone take you home after the procedure. PROCEDURE  You may be given a medicine to help you relax (sedative).  You will be given a medicine to make you sleep (general anesthetic).  Your abdomen will be inflated with a gas. This will make your organs easier to see.  Small incisions will be made in your abdomen.  A laparoscope and other small instruments will be inserted into the abdomen through the incisions.  A tissue sample may be removed from an organ in the abdomen for examination.  The instruments will be removed from the abdomen.  The gas will be  released.  The incisions will be closed with stitches (sutures). AFTER THE PROCEDURE  Your blood pressure, heart rate, breathing rate, and blood oxygen level will be monitored often until the medicines you were given have worn off.   This information is not intended to replace advice given to you by your health care provider. Make sure you discuss any questions you have with your health care provider.   Document Released: 01/26/2001 Document Revised: 07/11/2015 Document Reviewed: 06/02/2014 Elsevier Interactive Patient Education [employer].

## 2015-12-26 DIAGNOSIS — M1711 Unilateral primary osteoarthritis, right knee: Secondary | ICD-10-CM | POA: Diagnosis not present

## 2015-12-27 ENCOUNTER — Encounter: Payer: Self-pay | Admitting: Internal Medicine

## 2015-12-27 ENCOUNTER — Telehealth: Payer: Self-pay

## 2015-12-27 ENCOUNTER — Other Ambulatory Visit (INDEPENDENT_AMBULATORY_CARE_PROVIDER_SITE_OTHER): Payer: 59

## 2015-12-27 ENCOUNTER — Ambulatory Visit (INDEPENDENT_AMBULATORY_CARE_PROVIDER_SITE_OTHER): Payer: 59 | Admitting: Internal Medicine

## 2015-12-27 VITALS — BP 140/80 | HR 95 | Temp 98.9°F | Resp 20 | Wt 231.0 lb

## 2015-12-27 DIAGNOSIS — Z0000 Encounter for general adult medical examination without abnormal findings: Secondary | ICD-10-CM

## 2015-12-27 DIAGNOSIS — D509 Iron deficiency anemia, unspecified: Secondary | ICD-10-CM | POA: Diagnosis not present

## 2015-12-27 DIAGNOSIS — I10 Essential (primary) hypertension: Secondary | ICD-10-CM

## 2015-12-27 DIAGNOSIS — R7302 Impaired glucose tolerance (oral): Secondary | ICD-10-CM

## 2015-12-27 LAB — URINALYSIS, ROUTINE W REFLEX MICROSCOPIC
Bilirubin Urine: NEGATIVE
HGB URINE DIPSTICK: NEGATIVE
Ketones, ur: NEGATIVE
Leukocytes, UA: NEGATIVE
NITRITE: NEGATIVE
PH: 5.5 (ref 5.0–8.0)
SPECIFIC GRAVITY, URINE: 1.025 (ref 1.000–1.030)
TOTAL PROTEIN, URINE-UPE24: 30 — AB
UROBILINOGEN UA: 0.2 (ref 0.0–1.0)
Urine Glucose: NEGATIVE

## 2015-12-27 LAB — CBC WITH DIFFERENTIAL/PLATELET
BASOS ABS: 0 10*3/uL (ref 0.0–0.1)
Basophils Relative: 0.5 % (ref 0.0–3.0)
EOS ABS: 0.4 10*3/uL (ref 0.0–0.7)
Eosinophils Relative: 3.6 % (ref 0.0–5.0)
HCT: 33.3 % — ABNORMAL LOW (ref 36.0–46.0)
HEMOGLOBIN: 10.3 g/dL — AB (ref 12.0–15.0)
Lymphocytes Relative: 28 % (ref 12.0–46.0)
Lymphs Abs: 2.8 10*3/uL (ref 0.7–4.0)
MCHC: 30.8 g/dL (ref 30.0–36.0)
MCV: 67.4 fl — ABNORMAL LOW (ref 78.0–100.0)
Monocytes Absolute: 0.9 10*3/uL (ref 0.1–1.0)
Monocytes Relative: 8.4 % (ref 3.0–12.0)
Neutro Abs: 6 10*3/uL (ref 1.4–7.7)
Neutrophils Relative %: 59.5 % (ref 43.0–77.0)
Platelets: 842 10*3/uL — ABNORMAL HIGH (ref 150.0–400.0)
RBC: 4.94 Mil/uL (ref 3.87–5.11)
RDW: 17.2 % — ABNORMAL HIGH (ref 11.5–15.5)
WBC: 10.1 10*3/uL (ref 4.0–10.5)

## 2015-12-27 MED FILL — OXYCODONE/APAP 5/325MG: 5-325 | 5 days supply | Qty: 30 | Fill #0

## 2015-12-27 MED FILL — PANTOPRAZOLE SOD DR 40 MG T: 40 | 90 days supply | Qty: 90 | Fill #3

## 2015-12-27 NOTE — Patient Instructions (Signed)
Please start OTC iron sulfate 325 mg per day  Please continue all other medications as before, and refills have been done if requested.  Please have the pharmacy call with any other refills you may need.  Please continue your efforts at being more active, low cholesterol diet, and weight control.  You are otherwise up to date with prevention measures today.  Please keep your appointments with your specialists as you may have planned  Please go to the LAB in the Basement (turn left off the elevator) for the tests to be done today - just the CBC today  You will be contacted regarding the referral for: GI  Please return in 1 year for your yearly visit, or sooner if needed, with Lab testing done 3-5 days before  Good Luck with your surgury next week.

## 2015-12-27 NOTE — Telephone Encounter (Signed)
1.Patient said they put a weight on her foot and have her lift it standing. She also rides a stationary bike. Should she wait two weeks?  2.  Upon discussion she mentioned that at pre-op counsult 12/24/15 you counseled her that she is having RSO, left salpingectomy.  When you sent me surgery order it was for lap BSO per your December note and that is how she is currently scheduled.  I assume you want me to correct the surgery with the OR to read "Lap RSO, left salpingectomy"?

## 2015-12-27 NOTE — Progress Notes (Signed)
Pre visit review using our clinic review tool, if applicable. No additional management support is needed unless otherwise documented below in the visit note. 

## 2015-12-27 NOTE — Assessment & Plan Note (Addendum)
New finding, no obvoius GYN or dietary sourxce, for iron so4 otc daily, repeat cbc today, refer GI, may need EGD (last colonscopy 2015 pr pt), pt upset to a degree b/c her husband just died with esoph ca last yr, pt to cont PPI for now

## 2015-12-27 NOTE — Telephone Encounter (Signed)
1. I left patient the answer on her home phone voice mail per [facility] access note.  2. I called OR and changed procedure with Rise Paganini.  Changed on other sites accordingly.

## 2015-12-27 NOTE — Telephone Encounter (Signed)
Find out what type of exercise or physical therapy she has been doing for her knees. She could schedule her for her physical therapy 2 weeks from her surgery

## 2015-12-27 NOTE — Telephone Encounter (Signed)
Left salpingo-oophorectomy and right salpingectomy. She can do the exercises described 10 days after her surgery.

## 2015-12-27 NOTE — Assessment & Plan Note (Signed)
stable overall by history and exam, recent data reviewed with pt, and pt to continue medical treatment as before,  to f/u any worsening symptoms or concerns Lab Results  Component Value Date   HGBA1C 6.4 12/17/2015

## 2015-12-27 NOTE — Assessment & Plan Note (Signed)
stable overall by history and exam, recent data reviewed with pt, and pt to continue medical treatment as before,  to f/u any worsening symptoms or concerns BP Readings from Last 3 Encounters:  12/27/15 140/80  12/24/15 137/94  12/24/15 146/88

## 2015-12-27 NOTE — Progress Notes (Signed)
Subjective:    Patient ID: [name], female    DOB: 04-05-1966, 50 y.o.   MRN: [id number]  HPI  Here for wellness and f/u;  Overall doing ok;  Pt denies Chest pain, worsening SOB, DOE, wheezing, orthopnea, PND, worsening LE edema, palpitations, dizziness or syncope.  Pt denies neurological change such as new headache, facial or extremity weakness.  Pt denies polydipsia, polyuria, or low sugar symptoms. Pt states overall good compliance with treatment and medications, good tolerability, and has been trying to follow appropriate diet.  Pt denies worsening depressive symptoms, suicidal ideation or panic. No fever, night sweats, wt loss, loss of appetite, or other constitutional symptoms.  Pt states good ability with ADL's, has low fall risk, home safety reviewed and adequate, no other significant changes in hearing or vision, and only occasionally active with exercise.  S/p right knee TKR per Dr Mayer Camel.  More stress recently. Husband died last yr at 86 with esoph cancer. Denies worsening depressive symptoms, suicidal ideation, or panic.  Still sees GYN yearly with an irreg menses, now found to have left ovary cyst, now for surgury feb 28 for this.  No hx of iron deficiency anemia.  Has had less diet and some few lbs wt down during husband illness. Past Medical History  Diagnosis Date  . FIBROIDS, UTERUS   . GERD   . HYPERTENSION   . Obesity, unspecified   . Other abnormal glucose   . THROMBOCYTHEMIA   . NSVD (normal spontaneous vaginal delivery)     X3  . Diverticulosis of colon (without mention of hemorrhage)   . Arthritis    Past Surgical History  Procedure Laterality Date  . Knee arthroscopy Right 2006    Dr Ronnie Derby  . Resectoscopic polypectomy    . Hysteroscopy  06/19/2005    ASPIRATION OF LEFT PARATUBAL CYST  . Colonoscopy    . Tubal ligation  2006    FILSHIE CLIP TECHNIQUE  . Uterine fibroid surgery    . Total knee arthroplasty Right 10/10/2015    Procedure: TOTAL RIGHT KNEE  ARTHROPLASTY;  Surgeon: Frederik Pear, MD;  Location: [place];  Service: Orthopedics;  Laterality: Right;  . Diagnostic laparoscopy      reports that she has never smoked. She has never used smokeless tobacco. She reports that she drinks alcohol. She reports that she does not use illicit drugs. family history includes Breast cancer in her mother; Diabetes in her father; Hypertension in her father. There is no history of Colon cancer, Stomach cancer, or Rectal cancer. No Known Allergies Current Outpatient Prescriptions on File Prior to Visit  Medication Sig Dispense Refill  . amLODipine (NORVASC) 5 MG tablet Take 1 tablet (5 mg total) by mouth daily. (Patient taking differently: Take 5 mg by mouth at bedtime. ) 90 tablet 0  . aspirin 81 MG tablet Take 81 mg by mouth at bedtime.     Marland Kitchen aspirin EC 325 MG tablet Take 1 tablet (325 mg total) by mouth 2 (two) times daily. 30 tablet 0  . benazepril (LOTENSIN) 10 MG tablet Take 1 tablet (10 mg total) by mouth daily. (Patient taking differently: Take 10 mg by mouth at bedtime. ) 90 tablet 0  . calcium-vitamin D (OSCAL WITH D) 500-200 MG-UNIT tablet Take 1 tablet by mouth at bedtime.    Marland Kitchen ibuprofen (ADVIL,MOTRIN) 200 MG tablet Take 200 mg by mouth every 6 (six) hours as needed for pain.    Marland Kitchen loratadine (ALLERGY) 10 MG tablet Take 10  mg by mouth at bedtime.     . methocarbamol (ROBAXIN) 500 MG tablet Take 1 tablet (500 mg total) by mouth 2 (two) times daily with a meal. 60 tablet 0  . nystatin-triamcinolone (MYCOLOG II) cream APPLY TOPICALLY 3 TIMES A DAY (Patient taking differently: APPLY TOPICALLY 3 TIMES A DAY AS NEEDED FOR RASH.) 30 g 2  . oxyCODONE-acetaminophen (PERCOCET) 5-325 MG tablet Take 1 tablet by mouth every 4 (four) hours as needed for severe pain. 30 tablet 0  . pantoprazole (PROTONIX) 40 MG tablet TAKE 1 TABLET BY MOUTH ONCE DAILY 90 tablet 3   No current facility-administered medications on file prior to visit.   Review of  Systems Constitutional: Negative for increased diaphoresis, other activity, appetite or siginficant weight change other than noted HENT: Negative for worsening hearing loss, ear pain, facial swelling, mouth sores and neck stiffness.   Eyes: Negative for other worsening pain, redness or visual disturbance.  Respiratory: Negative for shortness of breath and wheezing  Cardiovascular: Negative for chest pain and palpitations.  Gastrointestinal: Negative for diarrhea, blood in stool, abdominal distention or other pain Genitourinary: Negative for hematuria, flank pain or change in urine volume.  Musculoskeletal: Negative for myalgias or other joint complaints.  Skin: Negative for color change and wound or drainage.  Neurological: Negative for syncope and numbness. other than noted Hematological: Negative for adenopathy. or other swelling Psychiatric/Behavioral: Negative for hallucinations, SI, self-injury, decreased concentration or other worsening agitation.      Objective:   Physical Exam BP 140/80 mmHg  Pulse 95  Temp(Src) 98.9 F (37.2 C) (Oral)  Resp 20  Wt 231 lb (104.781 kg)  SpO2 96%  LMP 12/19/2015 (Exact Date) VS noted,  Constitutional: Pt is oriented to person, place, and time. Appears well-developed and well-nourished, in no significant distress Head: Normocephalic and atraumatic.  Right Ear: External ear normal.  Left Ear: External ear normal.  Nose: Nose normal.  Mouth/Throat: Oropharynx is clear and moist.  Eyes: Conjunctivae and EOM are normal. Pupils are equal, round, and reactive to light.  Neck: Normal range of motion. Neck supple. No JVD present. No tracheal deviation present or significant neck LA or mass Cardiovascular: Normal rate, regular rhythm, normal heart sounds and intact distal pulses.   Pulmonary/Chest: Effort normal and breath sounds without rales or wheezing  Abdominal: Soft. Bowel sounds are normal. NT. No HSM  Musculoskeletal: Normal range of motion.  Exhibits no edema.  Lymphadenopathy:  Has no cervical adenopathy.  Neurological: Pt is alert and oriented to person, place, and time. Pt has normal reflexes. No cranial nerve deficit. Motor grossly intact Skin: Skin is warm and dry. No rash noted.  Psychiatric:  Has nervous mood and affect. Behavior is normal.     Assessment & Plan:

## 2015-12-27 NOTE — Telephone Encounter (Signed)
Patient is scheduled for lap BSO next Tuesday. She said she had knee replacement and is nearing the end of her physical therapy for her knee recovery.  She asked if postoperatively lap BSO if she should avoid her physical therapy for her knee?

## 2015-12-27 NOTE — Assessment & Plan Note (Signed)

## 2015-12-28 LAB — HEPATIC FUNCTION PANEL
ALBUMIN: 4.4 g/dL (ref 3.5–5.2)
ALK PHOS: 60 U/L (ref 39–117)
ALT: 19 U/L (ref 0–35)
AST: 16 U/L (ref 0–37)
Bilirubin, Direct: 0 mg/dL (ref 0.0–0.3)
TOTAL PROTEIN: 8.2 g/dL (ref 6.0–8.3)
Total Bilirubin: 0.2 mg/dL (ref 0.2–1.2)

## 2015-12-28 LAB — LIPID PANEL
CHOLESTEROL: 161 mg/dL (ref 0–200)
HDL: 48 mg/dL (ref >39.00)
LDL CALC: 86 mg/dL (ref 0–99)
NonHDL: 113.49
TRIGLYCERIDES: 137 mg/dL (ref 0.0–149.0)
Total CHOL/HDL Ratio: 3
VLDL: 27.4 mg/dL (ref 0.0–40.0)

## 2015-12-28 LAB — BASIC METABOLIC PANEL
BUN: 13 mg/dL (ref 6–23)
CO2: 28 meq/L (ref 19–32)
Calcium: 9.5 mg/dL (ref 8.4–10.5)
Chloride: 100 mEq/L (ref 96–112)
Creatinine, Ser: 0.85 mg/dL (ref 0.40–1.20)
GFR: 74.68 mL/min (ref >60.00)
GLUCOSE: 90 mg/dL (ref 70–99)
POTASSIUM: 4.3 meq/L (ref 3.5–5.1)
Sodium: 138 mEq/L (ref 135–145)

## 2015-12-28 LAB — TSH: TSH: 2.39 u[IU]/mL (ref 0.35–4.50)

## 2015-12-31 ENCOUNTER — Telehealth: Payer: Self-pay

## 2015-12-31 DIAGNOSIS — M1711 Unilateral primary osteoarthritis, right knee: Secondary | ICD-10-CM | POA: Diagnosis not present

## 2015-12-31 MED ORDER — DEXTROSE 5 % IV SOLN
2.0000 g | INTRAVENOUS | Status: DC
  Filled 2015-12-31: qty 2

## 2015-12-31 NOTE — Telephone Encounter (Signed)
Patient is scheduled for surgery in the morning. Lap LSO, R Salpingectomy.  She had lab last week at PCP and got a call about anemia and he started her on Ferrous Sulfate. This CBC is in her EPIC chart. Hgb. 10.3, Hct. 33.3 and platelets 842.   You can see the rest of the abnormal values.  She wanted to make sure this would not impact her surgery for tomorrow.

## 2015-12-31 NOTE — Telephone Encounter (Signed)
Dr. Moshe Salisbury  Had left the office but we placed a call to him and he returned my call. I relayed the info below and the labs below to him. He said to reassure patient that this will not be a problem with the surgery she is having.  I called patient and left detailed message on home phone number per DPR access note.

## 2016-01-01 ENCOUNTER — Ambulatory Visit (HOSPITAL_COMMUNITY): Admission: RE | Admit: 2016-01-01 | Payer: 59 | Source: Ambulatory Visit | Admitting: Gynecology

## 2016-01-01 ENCOUNTER — Telehealth: Payer: Self-pay

## 2016-01-01 ENCOUNTER — Encounter (HOSPITAL_COMMUNITY): Admission: RE | Payer: Self-pay | Source: Ambulatory Visit

## 2016-01-01 SURGERY — SALPINGO-OOPHORECTOMY, UNILATERAL, LAPAROSCOPIC
Anesthesia: General | Laterality: Right

## 2016-01-01 NOTE — Telephone Encounter (Signed)
I called patient to reschedule surgery that was cancelled due to Dr. Moshe Salisbury having an emergency.  Patient is to be out of town next week. We rescheduled her for 01/29/16.  I encouraged her to stay on her ferrous sulfate that Dr. Jenny Reichmann recommended for her anemia.  I have sent Dr. Moshe Salisbury a note inquiring if he will need to see her again prior to surgery since patient has already had pre op appt with him.  Patient would like to be notified if we have a cancellation on 3/14 or 3/21 and I have assured her I will call her if opening develops.

## 2016-01-02 ENCOUNTER — Other Ambulatory Visit: Payer: Self-pay | Admitting: Gynecology

## 2016-01-02 DIAGNOSIS — N83202 Unspecified ovarian cyst, left side: Secondary | ICD-10-CM

## 2016-01-07 ENCOUNTER — Telehealth: Payer: Self-pay

## 2016-01-07 NOTE — Telephone Encounter (Signed)
Patient advised. I told her I will route this to Dr. Moshe Salisbury and let her know how he responds.

## 2016-01-07 NOTE — Telephone Encounter (Signed)
This patient was schedule for last week for surgery with Dr. Moshe Salisbury and was reschedule to March 28. Now I have contacted her to reschedule her until Mar 04, 2016.  She wanted to know if he felt like this was okay for her to wait this long. I told her I would ask you to review her chart and advise me about rescheduling to May 2.

## 2016-01-07 NOTE — Telephone Encounter (Signed)
I don't think that 5 weeks would make a big difference but that is a question you can ask Dr. Toney Rakes in 1 week because he said that he would be looking through his messages in a week to see if he is comfortable waiting until May 2.

## 2016-01-08 DIAGNOSIS — M1711 Unilateral primary osteoarthritis, right knee: Secondary | ICD-10-CM | POA: Diagnosis not present

## 2016-01-15 ENCOUNTER — Ambulatory Visit: Payer: 59 | Admitting: Gynecology

## 2016-01-18 ENCOUNTER — Other Ambulatory Visit: Payer: Self-pay | Admitting: Gynecology

## 2016-01-18 DIAGNOSIS — N83201 Unspecified ovarian cyst, right side: Secondary | ICD-10-CM

## 2016-01-18 DIAGNOSIS — N83202 Unspecified ovarian cyst, left side: Principal | ICD-10-CM

## 2016-01-18 NOTE — Telephone Encounter (Signed)
I dont think it will be a problem but I would like for her to have an ultrasound in April and to see me. A few days before seeing me I would like to repeat a CA125

## 2016-01-18 NOTE — Telephone Encounter (Signed)
Patient informed. CA125 order placed and she will call for lab appt week before u/s is scheduled to have it drawn.

## 2016-01-23 ENCOUNTER — Other Ambulatory Visit: Payer: Self-pay | Admitting: *Deleted

## 2016-01-23 NOTE — Patient Outreach (Signed)
[name]) Care Management  01/23/2016  [name] Nov 02, 1966 [id number]   High Risk with initial screening   RN spoke with pt today and introduced the [facility] program/ and purpose for today's call,. Pt states she has been pending surgery for the last few month however her doctor has cancelled to the surgery several times. States her provider has been sick and the surgery has been post-pone to May (ortho). No problems at this time as pt remains on the [place] and states she has [employer] for 3 years with the June being the first year with this coverage. States it is very affordable and she plans to keep this coverage. RN inquired on pharmacy needs or any other resources that maybe needed. Pt indicates she was doing well and appreciates the call today. Provider contact number via [facility] through [place] if needed in the future for counseling on her upcoming surgery or available resources to assist her further concerning her needs. Pt once again appreciative and grateful for the follow up. No additional inquires as the call was ended.  Raina Mina, RN Care Management Coordinator [place] Office [phone]

## 2016-01-24 ENCOUNTER — Ambulatory Visit: Payer: 59 | Admitting: Gynecology

## 2016-01-24 ENCOUNTER — Other Ambulatory Visit: Payer: 59

## 2016-01-24 DIAGNOSIS — M1711 Unilateral primary osteoarthritis, right knee: Secondary | ICD-10-CM | POA: Diagnosis not present

## 2016-02-04 DIAGNOSIS — M1711 Unilateral primary osteoarthritis, right knee: Secondary | ICD-10-CM | POA: Diagnosis not present

## 2016-02-07 ENCOUNTER — Telehealth: Payer: Self-pay

## 2016-02-07 NOTE — Telephone Encounter (Signed)
Patient is scheduled for Lap Salpingectomy on 03/04/16. Her ultrasound is 02/25/16 and she will be in on 02/14/16 to have CA125 drawn.  She has been diagnosed with anemia in Feb with PCP (notes/labs are in Epic chart.).  Do you want to recheck CBC at same time we draw CA125.  Patient has been on iron.

## 2016-02-07 NOTE — Telephone Encounter (Signed)
error 

## 2016-02-12 ENCOUNTER — Other Ambulatory Visit: Payer: Self-pay | Admitting: Gynecology

## 2016-02-12 DIAGNOSIS — D508 Other iron deficiency anemias: Secondary | ICD-10-CM

## 2016-02-12 NOTE — Telephone Encounter (Signed)
Yes check CBC at the same time as her office visit and CA 125 an ultrasound

## 2016-02-12 NOTE — Telephone Encounter (Signed)
Patient informed. Order placed.

## 2016-02-14 ENCOUNTER — Other Ambulatory Visit: Payer: 59

## 2016-02-14 DIAGNOSIS — N83209 Unspecified ovarian cyst, unspecified side: Secondary | ICD-10-CM | POA: Diagnosis not present

## 2016-02-14 DIAGNOSIS — N83202 Unspecified ovarian cyst, left side: Principal | ICD-10-CM

## 2016-02-14 DIAGNOSIS — D508 Other iron deficiency anemias: Secondary | ICD-10-CM

## 2016-02-14 DIAGNOSIS — N83201 Unspecified ovarian cyst, right side: Secondary | ICD-10-CM | POA: Diagnosis not present

## 2016-02-14 LAB — CBC WITH DIFFERENTIAL/PLATELET
BASOS ABS: 79 {cells}/uL (ref 0–200)
BASOS PCT: 1 %
EOS PCT: 4 %
Eosinophils Absolute: 316 cells/uL (ref 15–500)
HCT: 41 % (ref 35.0–45.0)
Hemoglobin: 12.9 g/dL (ref 11.7–15.5)
LYMPHS PCT: 30 %
Lymphs Abs: 2370 cells/uL (ref 850–3900)
MCH: 23.7 pg — AB (ref 27.0–33.0)
MCHC: 31.5 g/dL — ABNORMAL LOW (ref 32.0–36.0)
MCV: 75.4 fL — ABNORMAL LOW (ref 80.0–100.0)
MONOS PCT: 10 %
MPV: 9.2 fL (ref 7.5–12.5)
Monocytes Absolute: 790 cells/uL (ref 200–950)
NEUTROS ABS: 4345 {cells}/uL (ref 1500–7800)
Neutrophils Relative %: 55 %
PLATELETS: 686 10*3/uL — AB (ref 140–400)
RBC: 5.44 MIL/uL — ABNORMAL HIGH (ref 3.80–5.10)
RDW: 24.6 % — AB (ref 11.0–15.0)
WBC: 7.9 10*3/uL (ref 3.8–10.8)

## 2016-02-15 LAB — CA 125: CA 125: 3 U/mL (ref <35)

## 2016-02-21 ENCOUNTER — Telehealth: Payer: Self-pay | Admitting: Internal Medicine

## 2016-02-21 DIAGNOSIS — D509 Iron deficiency anemia, unspecified: Secondary | ICD-10-CM

## 2016-02-21 NOTE — Telephone Encounter (Signed)
Pt called stated she had her lab work done 12/27/15 and the result from our office show her iron was a little low and Dr. Jenny Reichmann wanted to refer her to GI doctor for this. Pt stated she never heard anything from our office. Pt stated she just got it check again about a week ago and its show of normal range. Please check and advise on what to do?   # [phone]

## 2016-02-22 NOTE — Telephone Encounter (Signed)
Yes.  The appt most likely would be after may 2 at any rate, thanks

## 2016-02-22 NOTE — Telephone Encounter (Signed)
Not clear why the GI referral has not been done, will do again, and still should attend as the reason is not clear and should be evaluated even if the iron has improved,  thanks

## 2016-02-22 NOTE — Telephone Encounter (Signed)
Left vm for pt to call Pete back

## 2016-02-22 NOTE — Telephone Encounter (Signed)
Pt aware of the referral. Pt stated that she has a surgery appt schedule on 03/04/16, she was wondering if this GI appt can wait until then? She is a little concern.

## 2016-02-25 ENCOUNTER — Encounter (HOSPITAL_COMMUNITY): Payer: Self-pay

## 2016-02-25 ENCOUNTER — Encounter (HOSPITAL_COMMUNITY)
Admission: RE | Admit: 2016-02-25 | Discharge: 2016-02-25 | Disposition: A | Discharge status: Home / Self Care | Payer: 59 | Source: Ambulatory Visit | Attending: Gynecology | Admitting: Gynecology

## 2016-02-25 ENCOUNTER — Ambulatory Visit (INDEPENDENT_AMBULATORY_CARE_PROVIDER_SITE_OTHER): Payer: 59 | Admitting: Gynecology

## 2016-02-25 ENCOUNTER — Ambulatory Visit (INDEPENDENT_AMBULATORY_CARE_PROVIDER_SITE_OTHER): Payer: 59

## 2016-02-25 DIAGNOSIS — E669 Obesity, unspecified: Secondary | ICD-10-CM | POA: Insufficient documentation

## 2016-02-25 DIAGNOSIS — K219 Gastro-esophageal reflux disease without esophagitis: Secondary | ICD-10-CM | POA: Insufficient documentation

## 2016-02-25 DIAGNOSIS — N83202 Unspecified ovarian cyst, left side: Secondary | ICD-10-CM | POA: Insufficient documentation

## 2016-02-25 DIAGNOSIS — I10 Essential (primary) hypertension: Secondary | ICD-10-CM | POA: Diagnosis not present

## 2016-02-25 DIAGNOSIS — Z01812 Encounter for preprocedural laboratory examination: Secondary | ICD-10-CM | POA: Diagnosis not present

## 2016-02-25 LAB — CBC
HCT: 41 % (ref 36.0–46.0)
HEMOGLOBIN: 12.9 g/dL (ref 12.0–15.0)
MCH: 24.2 pg — AB (ref 26.0–34.0)
MCHC: 31.5 g/dL (ref 30.0–36.0)
MCV: 76.8 fL — ABNORMAL LOW (ref 78.0–100.0)
Platelets: 737 10*3/uL — ABNORMAL HIGH (ref 150–400)
RBC: 5.34 MIL/uL — ABNORMAL HIGH (ref 3.87–5.11)
RDW: 24.2 % — AB (ref 11.5–15.5)
WBC: 7.4 10*3/uL (ref 4.0–10.5)

## 2016-02-25 LAB — BASIC METABOLIC PANEL
Anion gap: 6 (ref 5–15)
BUN: 15 mg/dL (ref 6–20)
CALCIUM: 9.7 mg/dL (ref 8.9–10.3)
CO2: 29 mmol/L (ref 22–32)
Chloride: 103 mmol/L (ref 101–111)
Creatinine, Ser: 0.92 mg/dL (ref 0.44–1.00)
GFR calc Af Amer: >60 mL/min (ref >60)
Glucose, Bld: 127 mg/dL — ABNORMAL HIGH (ref 65–99)
Potassium: 3.9 mmol/L (ref 3.5–5.1)
Sodium: 138 mmol/L (ref 135–145)

## 2016-02-25 LAB — TYPE AND SCREEN
ABO/RH(D): O POS
ANTIBODY SCREEN: NEGATIVE

## 2016-02-25 LAB — ABO/RH: ABO/RH(D): O POS

## 2016-02-25 NOTE — Patient Instructions (Signed)
Your procedure is scheduled on:  Tuesday, Mar 04, 2016  Enter through the Main Entrance of [facility] at:  6:00 AM  Pick up the phone at the desk and dial [phone].  Call this number if you have problems the morning of surgery: [phone].  Remember: Do NOT eat food or drink after:  Midnight Monday, Mar 03, 2016  Take these medicines the morning of surgery with a SIP OF WATER: Protonix  Do NOT wear jewelry (body piercing), metal hair clips/bobby pins, make-up, or nail polish. Do NOT wear lotions, powders, or perfumes.  You may wear deodorant. Do NOT shave for 48 hours prior to surgery. Do NOT bring valuables to the hospital. Contacts, dentures, or bridgework may not be worn into surgery.  Have a responsible adult drive you home and stay with you for 24 hours after your procedure

## 2016-02-25 NOTE — Pre-Procedure Instructions (Signed)
Spoke with Sharyn Lull, she was seen followed by Dr. Beryle Beams for 5 years and released has not seen him in over 10 years but continues to take her ASA 81 mg daily.  She mentioned he was not too concerned until the platelets reached one million.  I discussed this information with Dr. Lauretta Grill, no new orders received at this time.

## 2016-02-25 NOTE — Pre-Procedure Instructions (Signed)
Dr. Jillyn Hidden is aware that patient has a history of thrombocythemia.  Dr. Jillyn Hidden wants to verify that the patient is being followed by a hematologist for her elevated platelets.  I will contact patient to get further information on her thrombocythemia management.  Dr. Jillyn Hidden has cleared patient from an anesthesia prospective for surgery.

## 2016-02-26 ENCOUNTER — Ambulatory Visit (INDEPENDENT_AMBULATORY_CARE_PROVIDER_SITE_OTHER): Payer: 59 | Admitting: Gynecology

## 2016-02-26 ENCOUNTER — Encounter: Payer: Self-pay | Admitting: Gynecology

## 2016-02-26 VITALS — BP 136/88

## 2016-02-26 DIAGNOSIS — N83202 Unspecified ovarian cyst, left side: Secondary | ICD-10-CM

## 2016-02-26 NOTE — Progress Notes (Signed)
Patient is a 50 year old presented to the office today to discuss her ultrasound that was done on April 24 here in our office in preparation for her upcoming laparoscopic left salpingo-oophorectomy and right salpingectomy as a result of persistent left ovarian cyst. Patient had been seen on February 20 for preoperative examination for her initial planned surgery in March but had to be rescheduled until next week. The following note reflects that office visit an examination with the addition of the ultrasound that was done here in the office yesterday:  Patient was last seen the office in 10/04/2015 and her history is as follows:  Patient had an ultrasound and sonohysterogram as a result of her irregular vaginal bleeding that she reported time of her annual exam on 09/10/2015. At that office visit she had an endometrial biopsy which demonstrated the following:  Diagnosis Endometrium, biopsy, endocervix/cervix - ATROPHIC ENDOMETRIUM WITH BREAKDOWN IN A BACKGROUND OF BLOOD CLOT, RARE BENIGN ENDOCERVICAL CELLS AND SQUAMOUS EPITHELIAL CELLS. - NO ENDOMETRIAL HYPERPLASIA, ATYPIA, CERVICAL DYSPLASIA OR MALIGNANCY IDENTIFIED.  her Pap smear November 2016 was normal. Patient then on 10/04/2015 had an ultrasound which demonstrated the following: Uterus measured 8.3 x 6.6 x 5.8 cm with endometrial stripe of 13.7 mm the cervix was cleansed with Betadine solution and a sterile catheter was introduced into the uterine cavity and no intracavitary defect was noted. The right ovary demonstrated a thin wall cyst with septation measuring 26 x 25 x 28 mm with negative color flow. Blood flow was seen to the right ovary otherwise. Left ovary was enlarged multiseptated cystic mass with echo-free areas measuring 5.7 x 5.7 x 5.6 cm and a reticular echo pattern additional cyst measuring 4.1 x 4.0 with arterial blood flow to the ovarian tissue but negative color flow on both masses. There was no fluid in the  cul-de-sac  Ultrasound on February 2017 demonstrated the following: Uterus measured 10.2 x 5.9 x 5.6 cm with endometrial stripe of 8.8 mm. Right ovary normal previous has not seen. Left ovary with a thin wall echo-free cyst measuring 20 x 16 19 mm, a thick wall corpus luteum cyst measuring 13 x 10 mm was noted also continued presence of a thinwall cystic mass measuring 59 x 64 x 54 mm average size 59 mm. Echogenic debris was noted negative color flow. Arterial blood flow was seen to left ovary. Previous reticular cystic mass not seen. No fluid in the cul-de-sac.  Patient had a normal ROMA-1 test December 2016 Patient had a knee replacement back in December. Review of her record also indicated that she has had history in the past of mild essential thrombocytosis and had been followed by Dr. Murriel Hopper who had recommend she be on baby aspirin. She last saw him back in 2011 and was released her from her care. She had been tested in the past for the JAK-2 mutation and was negative. Patient with past history resectoscopic myomectomy for prolapsing myoma several years ago. Patient reports the past 2 months her cycles of been normal.   Ultrasound 02/25/2016: Uterus measured 8.3 x 5.5 x 4.3 cm with endometrial stripe at 2.1 mm. Right ovary was normal. Left ovarian tissue with adjacent thinwall echo-free avascular cyst measuring 55 x 62 x 53 mm was noted with an average size of 56 mm. No significant change in size, previous echogenic debris was not noted this time on ultrasound. Positive color flow to the left ovary was noted. No fluid in the cul-de-sac.  Pertinent Gynecological History: Menses: Normal  Bleeding: Normal  Contraception: tubal ligation DES exposure: denies Blood transfusions: none Sexually transmitted diseases: no past history Previous GYN Procedures: 3 normal spontaneous vaginal deliveries resectoscopic myomectomy, tubal ligation  Last mammogram: normal Date: 2016st pap: normal Date:  3 OB History: G3 , P3   Menstrual History: Menarche age: 65  No LMP recorded.    Past Medical History  Diagnosis Date  . FIBROIDS, UTERUS   . GERD   . HYPERTENSION   . Obesity, unspecified   . Other abnormal glucose   . THROMBOCYTHEMIA   . NSVD (normal spontaneous vaginal delivery)     X3  . Diverticulosis of colon (without mention of hemorrhage)   . Diabetes mellitus     GESTATIONAL  . Arthritis     Past Surgical History  Procedure Laterality Date  . Knee arthroscopy Right 2006    Dr Ronnie Derby  . Resectoscopic polypectomy    . Hysteroscopy  06/19/2005    ASPIRATION OF LEFT PARATUBAL CYST  . Colonoscopy    . Tubal ligation  2006    FILSHIE CLIP TECHNIQUE  . Uterine fibroid surgery    . Total knee arthroplasty Right 10/10/2015    Procedure: TOTAL RIGHT KNEE ARTHROPLASTY; Surgeon: Frederik Pear, MD; Location: [facility]; Service: Orthopedics; Laterality: Right;    Family History  Problem Relation Age of Onset  . Breast cancer Mother   . Diabetes Father   . Hypertension Father   . Colon cancer Neg Hx   . Stomach cancer Neg Hx   . Rectal cancer Neg Hx     Social History:  reports that she has never smoked. She has never used smokeless tobacco. She reports that she drinks alcohol. She reports that she does not use illicit drugs.  Allergies: No Known Allergies   (Not in a hospital admission)  REVIEW OF SYSTEMS: A ROS was performed and pertinent positives and negatives are included in the history. GENERAL: No fevers or chills. HEENT: No change in vision, no earache, sore throat or sinus congestion. NECK: No pain or stiffness. CARDIOVASCULAR: No chest pain or pressure. No palpitations. PULMONARY: No shortness of breath, cough or wheeze. GASTROINTESTINAL: No abdominal pain, nausea, vomiting or diarrhea, melena or bright red blood per rectum. GENITOURINARY: No  urinary frequency, urgency, hesitancy or dysuria. MUSCULOSKELETAL: No joint or muscle pain, no back pain, no recent trauma. DERMATOLOGIC: No rash, no itching, no lesions. ENDOCRINE: No polyuria, polydipsia, no heat or cold intolerance. No recent change in weight. HEMATOLOGICAL: No anemia or easy bruising or bleeding. NEUROLOGIC: No headache, seizures, numbness, tingling or weakness. PSYCHIATRIC: No depression, no loss of interest in normal activity or change in sleep pattern.     Blood pressure 146/88.  Physical Exam:  HEENT:unremarkable Neck:Supple, midline, no thyroid megaly, no carotid bruits Lungs: Clear to auscultation no rhonchi's or wheezes Heart:Regular rate and rhythm, no murmurs or gallops Breast Exam:Examine November 2016 Pelvic exam: Bartholin urethra Skene was within normal limits Vagina: No lesions or discharge Cervix: No lesions or discharge Uterus anteverted upper limits of normal Adnexa: No palpable masses Rectal exam: Not done   Assessment/Plan: Patient with persistent left ovarian cyst is scheduled to undergo laparoscopic left salpingo-oophorectomy with right salpingectomy. The risks benefits and pros and cons of the operation as follows:   Patient was counseled as to the risk of surgery to include the following:  1. Infection (prohylactic antibiotics will be administered)  2. DVT/Pulmonary Embolism (prophylactic pneumo compression stockings will be used)  3.Trauma to internal organs requiring  additional surgical procedure to repair any injury to   Internal organs requiring perhaps additional hospitalization days.  4.Hemmorhage requiring transfusion and blood products which carry risks such as  anaphylactic reaction, hepatitis and AIDS  Patient had received literature information on the procedure scheduled and all her questions were answered and fully accepts all risk.

## 2016-02-26 NOTE — Patient Instructions (Signed)
Ovarian Cyst An ovarian cyst is a fluid-filled sac that forms on an ovary. The ovaries are small organs that produce eggs in women. Various types of cysts can form on the ovaries. Most are not cancerous. Many do not cause problems, and they often go away on their own. Some may cause symptoms and require treatment. Common types of ovarian cysts include:  Functional cysts--These cysts may occur every month during the menstrual cycle. This is normal. The cysts usually go away with the next menstrual cycle if the woman does not get pregnant. Usually, there are no symptoms with a functional cyst.  Endometrioma cysts--These cysts form from the tissue that lines the uterus. They are also called "chocolate cysts" because they become filled with blood that turns brown. This type of cyst can cause pain in the lower abdomen during intercourse and with your menstrual period.  Cystadenoma cysts--This type develops from the cells on the outside of the ovary. These cysts can get very big and cause lower abdomen pain and pain with intercourse. This type of cyst can twist on itself, cut off its blood supply, and cause severe pain. It can also easily rupture and cause a lot of pain.  Dermoid cysts--This type of cyst is sometimes found in both ovaries. These cysts may contain different kinds of body tissue, such as skin, teeth, hair, or cartilage. They usually do not cause symptoms unless they get very big.  Theca lutein cysts--These cysts occur when too much of a certain hormone (human chorionic gonadotropin) is produced and overstimulates the ovaries to produce an egg. This is most common after procedures used to assist with the conception of a baby (in vitro fertilization). CAUSES   Fertility drugs can cause a condition in which multiple large cysts are formed on the ovaries. This is called ovarian hyperstimulation syndrome.  A condition called polycystic ovary syndrome can cause hormonal imbalances that can lead to  nonfunctional ovarian cysts. SIGNS AND SYMPTOMS  Many ovarian cysts do not cause symptoms. If symptoms are present, they may include:  Pelvic pain or pressure.  Pain in the lower abdomen.  Pain during sexual intercourse.  Increasing girth (swelling) of the abdomen.  Abnormal menstrual periods.  Increasing pain with menstrual periods.  Stopping having menstrual periods without being pregnant. DIAGNOSIS  These cysts are commonly found during a routine or annual pelvic exam. Tests may be ordered to find out more about the cyst. These tests may include:  Ultrasound.  X-ray of the pelvis.  CT scan.  MRI.  Blood tests. TREATMENT  Many ovarian cysts go away on their own without treatment. Your health care provider may want to check your cyst regularly for 2-3 months to see if it changes. For women in menopause, it is particularly important to monitor a cyst closely because of the higher rate of ovarian cancer in menopausal women. When treatment is needed, it may include any of the following:  A procedure to drain the cyst (aspiration). This may be done using a long needle and ultrasound. It can also be done through a laparoscopic procedure. This involves using a thin, lighted tube with a tiny camera on the end (laparoscope) inserted through a small incision.  Surgery to remove the whole cyst. This may be done using laparoscopic surgery or an open surgery involving a larger incision in the lower abdomen.  Hormone treatment or birth control pills. These methods are sometimes used to help dissolve a cyst. HOME CARE INSTRUCTIONS   Only take over-the-counter   or prescription medicines as directed by your health care provider.  Follow up with your health care provider as directed.  Get regular pelvic exams and Pap tests. SEEK MEDICAL CARE IF:   Your periods are late, irregular, or painful, or they stop.  Your pelvic pain or abdominal pain does not go away.  Your abdomen becomes  larger or swollen.  You have pressure on your bladder or trouble emptying your bladder completely.  You have pain during sexual intercourse.  You have feelings of fullness, pressure, or discomfort in your stomach.  You lose weight for no apparent reason.  You feel generally ill.  You become constipated.  You lose your appetite.  You develop acne.  You have an increase in body and facial hair.  You are gaining weight, without changing your exercise and eating habits.  You think you are pregnant. SEEK IMMEDIATE MEDICAL CARE IF:   You have increasing abdominal pain.  You feel sick to your stomach (nauseous), and you throw up (vomit).  You develop a fever that comes on suddenly.  You have abdominal pain during a bowel movement.  Your menstrual periods become heavier than usual. MAKE SURE YOU:  Understand these instructions.  Will watch your condition.  Will get help right away if you are not doing well or get worse.   This information is not intended to replace advice given to you by your health care provider. Make sure you discuss any questions you have with your health care provider.   Document Released: 10/20/2005 Document Revised: 10/25/2013 Document Reviewed: 06/27/2013 Elsevier Interactive Patient Education 2016 [employer] Diagnostic Laparoscopy A diagnostic laparoscopy is a procedure to diagnose diseases in the abdomen. During the procedure, a thin, lighted, pencil-sized instrument called a laparoscope is inserted into the abdomen through an incision. The laparoscope allows your health care provider to look at the organs inside your body. LET [facility] CARE PROVIDER KNOW ABOUT:  Any allergies you have.  All medicines you are taking, including vitamins, herbs, eye drops, creams, and over-the-counter medicines.  Previous problems you or members of your family have had with the use of anesthetics.  Any blood disorders you have.  Previous surgeries you  have had.  Medical conditions you have. RISKS AND COMPLICATIONS  Generally, this is a safe procedure. However, problems can occur, which may include:  Infection.  Bleeding.  Damage to other organs.  Allergic reaction to the anesthetics used during the procedure. BEFORE THE PROCEDURE  Do not eat or drink anything after midnight on the night before the procedure or as directed by your health care provider.  Ask your health care provider about:  Changing or stopping your regular medicines.  Taking medicines such as aspirin and ibuprofen. These medicines can thin your blood. Do not take these medicines before your procedure if your health care provider instructs you not to.  Plan to have someone take you home after the procedure. PROCEDURE  You may be given a medicine to help you relax (sedative).  You will be given a medicine to make you sleep (general anesthetic).  Your abdomen will be inflated with a gas. This will make your organs easier to see.  Small incisions will be made in your abdomen.  A laparoscope and other small instruments will be inserted into the abdomen through the incisions.  A tissue sample may be removed from an organ in the abdomen for examination.  The instruments will be removed from the abdomen.  The gas will be  released.  The incisions will be closed with stitches (sutures). AFTER THE PROCEDURE  Your blood pressure, heart rate, breathing rate, and blood oxygen level will be monitored often until the medicines you were given have worn off.   This information is not intended to replace advice given to you by your health care provider. Make sure you discuss any questions you have with your health care provider.   Document Released: 01/26/2001 Document Revised: 07/11/2015 Document Reviewed: 06/02/2014 Elsevier Interactive Patient Education [employer].

## 2016-02-28 ENCOUNTER — Other Ambulatory Visit: Payer: Self-pay | Admitting: Internal Medicine

## 2016-02-28 ENCOUNTER — Other Ambulatory Visit: Payer: Self-pay

## 2016-02-28 MED ORDER — BENAZEPRIL HCL 10 MG PO TABS: 10.0000 mg | ORAL_TABLET | Freq: Every day | ORAL | Status: DC

## 2016-03-03 MED ORDER — DEXTROSE 5 % IV SOLN
2.0000 g | INTRAVENOUS | Status: AC
  Administered 2016-03-04: 2 g via INTRAVENOUS
  Filled 2016-03-03: qty 2

## 2016-03-03 MED FILL — AMLODIPINE BESYLATE 5 MG TA: 5 | 90 days supply | Qty: 90 | Fill #0

## 2016-03-03 MED FILL — BENAZEPRIL HCL 10 MG TABLET: 10 | 90 days supply | Qty: 90 | Fill #0

## 2016-03-04 ENCOUNTER — Ambulatory Visit (HOSPITAL_COMMUNITY)
Admission: RE | Admit: 2016-03-04 | Discharge: 2016-03-04 | Disposition: A | Discharge status: Home / Self Care | Payer: 59 | Source: Ambulatory Visit | Attending: Gynecology | Admitting: Gynecology

## 2016-03-04 ENCOUNTER — Encounter (HOSPITAL_COMMUNITY): Payer: Self-pay | Admitting: Anesthesiology

## 2016-03-04 ENCOUNTER — Ambulatory Visit: Payer: 59 | Admitting: Physician Assistant

## 2016-03-04 ENCOUNTER — Ambulatory Visit (HOSPITAL_COMMUNITY): Payer: 59 | Admitting: Anesthesiology

## 2016-03-04 ENCOUNTER — Encounter (HOSPITAL_COMMUNITY)
Admission: RE | Disposition: A | Discharge status: Home / Self Care | Payer: Self-pay | Source: Ambulatory Visit | Attending: Gynecology

## 2016-03-04 DIAGNOSIS — I10 Essential (primary) hypertension: Secondary | ICD-10-CM | POA: Diagnosis not present

## 2016-03-04 DIAGNOSIS — D271 Benign neoplasm of left ovary: Secondary | ICD-10-CM | POA: Insufficient documentation

## 2016-03-04 DIAGNOSIS — K219 Gastro-esophageal reflux disease without esophagitis: Secondary | ICD-10-CM | POA: Diagnosis not present

## 2016-03-04 DIAGNOSIS — D259 Leiomyoma of uterus, unspecified: Secondary | ICD-10-CM | POA: Diagnosis not present

## 2016-03-04 DIAGNOSIS — N83209 Unspecified ovarian cyst, unspecified side: Secondary | ICD-10-CM | POA: Diagnosis not present

## 2016-03-04 DIAGNOSIS — N83202 Unspecified ovarian cyst, left side: Secondary | ICD-10-CM | POA: Diagnosis not present

## 2016-03-04 DIAGNOSIS — N838 Other noninflammatory disorders of ovary, fallopian tube and broad ligament: Secondary | ICD-10-CM | POA: Diagnosis not present

## 2016-03-04 DIAGNOSIS — N83201 Unspecified ovarian cyst, right side: Secondary | ICD-10-CM | POA: Diagnosis not present

## 2016-03-04 HISTORY — PX: LYSIS OF ADHESION: SHX5961

## 2016-03-04 HISTORY — PX: LAPAROSCOPIC BILATERAL SALPINGO OOPHERECTOMY: SHX5890

## 2016-03-04 LAB — PREGNANCY, URINE: Preg Test, Ur: NEGATIVE

## 2016-03-04 SURGERY — SALPINGO-OOPHORECTOMY, BILATERAL, LAPAROSCOPIC
Anesthesia: General | Laterality: Right

## 2016-03-04 MED ORDER — BUPIVACAINE HCL (PF) 0.25 % IJ SOLN
INTRAMUSCULAR | Status: DC | PRN
  Administered 2016-03-04: 20 mL

## 2016-03-04 MED ORDER — HYDROCODONE-ACETAMINOPHEN 7.5-325 MG PO TABS
ORAL_TABLET | ORAL | Status: AC
  Filled 2016-03-04: qty 1

## 2016-03-04 MED ORDER — LACTATED RINGERS IV SOLN
INTRAVENOUS | Status: DC
  Administered 2016-03-04 (×2): via INTRAVENOUS

## 2016-03-04 MED ORDER — LIDOCAINE HCL (CARDIAC) 20 MG/ML IV SOLN
INTRAVENOUS | Status: DC | PRN
  Administered 2016-03-04: 60 mg via INTRAVENOUS

## 2016-03-04 MED ORDER — SCOPOLAMINE 1 MG/3DAYS TD PT72
1.0000 | MEDICATED_PATCH | Freq: Once | TRANSDERMAL | Status: DC
  Administered 2016-03-04: 1.5 mg via TRANSDERMAL

## 2016-03-04 MED ORDER — DEXAMETHASONE SODIUM PHOSPHATE 10 MG/ML IJ SOLN
INTRAMUSCULAR | Status: DC | PRN
  Administered 2016-03-04: 10 mg via INTRAVENOUS

## 2016-03-04 MED ORDER — MEPERIDINE HCL 25 MG/ML IJ SOLN: 6.2500 mg | INTRAMUSCULAR | Status: DC | PRN

## 2016-03-04 MED ORDER — ONDANSETRON HCL 4 MG/2ML IJ SOLN
INTRAMUSCULAR | Status: DC | PRN
  Administered 2016-03-04: 4 mg via INTRAVENOUS

## 2016-03-04 MED ORDER — MIDAZOLAM HCL 2 MG/2ML IJ SOLN
INTRAMUSCULAR | Status: AC
  Filled 2016-03-04: qty 2

## 2016-03-04 MED ORDER — FENTANYL CITRATE (PF) 100 MCG/2ML IJ SOLN
INTRAMUSCULAR | Status: DC | PRN
  Administered 2016-03-04 (×2): 50 ug via INTRAVENOUS
  Administered 2016-03-04: 100 ug via INTRAVENOUS
  Administered 2016-03-04: 50 ug via INTRAVENOUS

## 2016-03-04 MED ORDER — FENTANYL CITRATE (PF) 100 MCG/2ML IJ SOLN: 25.0000 ug | INTRAMUSCULAR | Status: DC | PRN

## 2016-03-04 MED ORDER — OXYCODONE-ACETAMINOPHEN 5-325 MG PO TABS: 1.0000 | ORAL_TABLET | ORAL | Status: DC | PRN

## 2016-03-04 MED ORDER — HYDROMORPHONE HCL 1 MG/ML IJ SOLN
INTRAMUSCULAR | Status: DC | PRN
  Administered 2016-03-04: 1 mg via INTRAVENOUS

## 2016-03-04 MED ORDER — LACTATED RINGERS IR SOLN
Status: DC | PRN
  Administered 2016-03-04: 3000 mL

## 2016-03-04 MED ORDER — BUPIVACAINE HCL (PF) 0.25 % IJ SOLN
INTRAMUSCULAR | Status: AC
  Filled 2016-03-04: qty 30

## 2016-03-04 MED ORDER — HYDROMORPHONE HCL 1 MG/ML IJ SOLN
INTRAMUSCULAR | Status: AC
  Filled 2016-03-04: qty 1

## 2016-03-04 MED ORDER — SCOPOLAMINE 1 MG/3DAYS TD PT72
MEDICATED_PATCH | TRANSDERMAL | Status: AC
  Administered 2016-03-04: 1.5 mg via TRANSDERMAL
  Filled 2016-03-04: qty 1

## 2016-03-04 MED ORDER — DEXAMETHASONE SODIUM PHOSPHATE 10 MG/ML IJ SOLN
INTRAMUSCULAR | Status: AC
  Filled 2016-03-04: qty 1

## 2016-03-04 MED ORDER — KETOROLAC TROMETHAMINE 30 MG/ML IJ SOLN
INTRAMUSCULAR | Status: DC | PRN
  Administered 2016-03-04: 30 mg via INTRAVENOUS

## 2016-03-04 MED ORDER — GLYCOPYRROLATE 0.2 MG/ML IJ SOLN
INTRAMUSCULAR | Status: DC | PRN
  Administered 2016-03-04: 0.6 mg via INTRAVENOUS

## 2016-03-04 MED ORDER — SILVER NITRATE-POT NITRATE 75-25 % EX MISC
CUTANEOUS | Status: AC
  Filled 2016-03-04: qty 1

## 2016-03-04 MED ORDER — NEOSTIGMINE METHYLSULFATE 10 MG/10ML IV SOLN
INTRAVENOUS | Status: DC | PRN
  Administered 2016-03-04: 3 mg via INTRAVENOUS

## 2016-03-04 MED ORDER — HYDROCODONE-ACETAMINOPHEN 7.5-325 MG PO TABS
1.0000 | ORAL_TABLET | Freq: Once | ORAL | Status: AC | PRN
  Administered 2016-03-04: 1 via ORAL

## 2016-03-04 MED ORDER — PROPOFOL 10 MG/ML IV BOLUS
INTRAVENOUS | Status: AC
  Filled 2016-03-04: qty 20

## 2016-03-04 MED ORDER — ROCURONIUM BROMIDE 100 MG/10ML IV SOLN
INTRAVENOUS | Status: DC | PRN
  Administered 2016-03-04: 10 mg via INTRAVENOUS
  Administered 2016-03-04: 50 mg via INTRAVENOUS
  Administered 2016-03-04: 10 mg via INTRAVENOUS

## 2016-03-04 MED ORDER — GLYCOPYRROLATE 0.2 MG/ML IJ SOLN
INTRAMUSCULAR | Status: AC
  Filled 2016-03-04: qty 4

## 2016-03-04 MED ORDER — MIDAZOLAM HCL 5 MG/5ML IJ SOLN
INTRAMUSCULAR | Status: DC | PRN
  Administered 2016-03-04: 2 mg via INTRAVENOUS

## 2016-03-04 MED ORDER — BACITRACIN-NEOMYCIN-POLYMYXIN 400-5-5000 EX OINT
TOPICAL_OINTMENT | CUTANEOUS | Status: AC
  Filled 2016-03-04: qty 1

## 2016-03-04 MED ORDER — BACITRACIN-NEOMYCIN-POLYMYXIN 400-5-5000 EX OINT
TOPICAL_OINTMENT | CUTANEOUS | Status: DC | PRN
  Administered 2016-03-04: 1 via TOPICAL

## 2016-03-04 MED ORDER — PROPOFOL 10 MG/ML IV BOLUS
INTRAVENOUS | Status: DC | PRN
  Administered 2016-03-04: 170 mg via INTRAVENOUS

## 2016-03-04 MED ORDER — METHYLENE BLUE 1 % INJ SOLN
INTRAMUSCULAR | Status: AC
  Filled 2016-03-04: qty 10

## 2016-03-04 MED ORDER — KETOROLAC TROMETHAMINE 30 MG/ML IJ SOLN
INTRAMUSCULAR | Status: AC
  Filled 2016-03-04: qty 1

## 2016-03-04 MED ORDER — FENTANYL CITRATE (PF) 250 MCG/5ML IJ SOLN
INTRAMUSCULAR | Status: AC
  Filled 2016-03-04: qty 5

## 2016-03-04 MED ORDER — KETOROLAC TROMETHAMINE 30 MG/ML IJ SOLN: 30.0000 mg | Freq: Once | INTRAMUSCULAR | Status: DC

## 2016-03-04 MED ORDER — SILVER NITRATE-POT NITRATE 75-25 % EX MISC
CUTANEOUS | Status: DC | PRN
  Administered 2016-03-04: 1

## 2016-03-04 MED ORDER — ONDANSETRON HCL 4 MG/2ML IJ SOLN
INTRAMUSCULAR | Status: AC
  Filled 2016-03-04: qty 2

## 2016-03-04 MED ORDER — NEOSTIGMINE METHYLSULFATE 10 MG/10ML IV SOLN
INTRAVENOUS | Status: AC
  Filled 2016-03-04: qty 1

## 2016-03-04 MED ORDER — LIDOCAINE HCL (CARDIAC) 20 MG/ML IV SOLN
INTRAVENOUS | Status: AC
  Filled 2016-03-04: qty 5

## 2016-03-04 MED ORDER — ONDANSETRON HCL 4 MG/2ML IJ SOLN: 4.0000 mg | Freq: Once | INTRAMUSCULAR | Status: DC | PRN

## 2016-03-04 MED FILL — OXYCODONE/APAP 5/325MG: 5-325 | 5 days supply | Qty: 30 | Fill #0

## 2016-03-04 SURGICAL SUPPLY — 34 items
APPLICATOR ARISTA FLEXITIP XL (MISCELLANEOUS) ×5 IMPLANT
BARRIER ADHS 3X4 INTERCEED (GAUZE/BANDAGES/DRESSINGS) IMPLANT
CABLE HIGH FREQUENCY MONO STRZ (ELECTRODE) IMPLANT
CLOTH BEACON ORANGE TIMEOUT ST (SAFETY) ×5 IMPLANT
COVER MAYO STAND STRL (DRAPES) ×5 IMPLANT
DRSG COVADERM PLUS 2X2 (GAUZE/BANDAGES/DRESSINGS) ×10 IMPLANT
DRSG OPSITE POSTOP 3X4 (GAUZE/BANDAGES/DRESSINGS) ×5 IMPLANT
FILTER SMOKE EVAC LAPAROSHD (FILTER) ×5 IMPLANT
GLOVE BIOGEL PI IND STRL 7.0 (GLOVE) ×4 IMPLANT
GLOVE BIOGEL PI IND STRL 8 (GLOVE) ×4 IMPLANT
GLOVE BIOGEL PI INDICATOR 7.0 (GLOVE) ×1
GLOVE BIOGEL PI INDICATOR 8 (GLOVE) ×1
GLOVE ECLIPSE 7.5 STRL STRAW (GLOVE) ×10 IMPLANT
GOWN STRL REUS W/TWL LRG LVL3 (GOWN DISPOSABLE) ×10 IMPLANT
HEMOSTAT ARISTA ABSORB 3G PWDR (MISCELLANEOUS) ×5 IMPLANT
LIQUID BAND (GAUZE/BANDAGES/DRESSINGS) ×5 IMPLANT
NS IRRIG 1000ML POUR BTL (IV SOLUTION) ×5 IMPLANT
PACK LAPAROSCOPY BASIN (CUSTOM PROCEDURE TRAY) ×5 IMPLANT
PAD TRENDELENBURG POSITION (MISCELLANEOUS) ×5 IMPLANT
POUCH LAPAROSCOPIC INSTRUMENT (MISCELLANEOUS) IMPLANT
POUCH SPECIMEN RETRIEVAL 10MM (ENDOMECHANICALS) ×5 IMPLANT
SET IRRIG TUBING LAPAROSCOPIC (IRRIGATION / IRRIGATOR) ×5 IMPLANT
SHEARS HARMONIC ACE PLUS 36CM (ENDOMECHANICALS) ×5 IMPLANT
SLEEVE XCEL OPT CAN 5 100 (ENDOMECHANICALS) ×5 IMPLANT
SOLUTION ELECTROLUBE (MISCELLANEOUS) IMPLANT
SUT VIC AB 3-0 PS2 18 (SUTURE) ×1
SUT VIC AB 3-0 PS2 18XBRD (SUTURE) ×4 IMPLANT
SUT VICRYL 0 UR6 27IN ABS (SUTURE) ×5 IMPLANT
TOWEL OR 17X24 6PK STRL BLUE (TOWEL DISPOSABLE) ×10 IMPLANT
TRAY FOLEY CATH SILVER 14FR (SET/KITS/TRAYS/PACK) ×5 IMPLANT
TROCAR 12M 150ML BLUNT (TROCAR) ×5 IMPLANT
TROCAR XCEL NON-BLD 11X100MML (ENDOMECHANICALS) ×5 IMPLANT
TROCAR XCEL NON-BLD 5MMX100MML (ENDOMECHANICALS) ×5 IMPLANT
WARMER LAPAROSCOPE (MISCELLANEOUS) ×5 IMPLANT

## 2016-03-04 NOTE — Interval H&P Note (Signed)
History and Physical Interval Note:  03/04/2016 7:08 AM  [name]  has presented today for surgery, with the diagnosis of bilateral ovarian cysts  The various methods of treatment have been discussed with the patient and family. After consideration of risks, benefits and other options for treatment, the patient has consented to  Procedure(s): LAPAROSCOPIC UNILATERAL SALPINGO OOPHORECTOMY (Left) LAPAROSCOPIC UNILATERAL SALPINGECTOMY (Right) as a surgical intervention .  The patient's history has been reviewed, patient examined, no change in status, stable for surgery.  I have reviewed the patient's chart and labs.  Questions were answered to the patient's satisfaction.   After speaking with patient this am she has decided to have both tubes and ovaries removed. Laparoscopic bilateral salpingoophorectomy will be performed  [facility]

## 2016-03-04 NOTE — Anesthesia Postprocedure Evaluation (Signed)
Anesthesia Post Note  Patient: [name]  Procedure(s) Performed: Procedure(s) (LRB): LAPAROSCOPIC BILATERAL SALPINGO OOPHORECTOMY with pelvic washings (Bilateral) LYSIS OF ADHESION (N/A)  Patient location during evaluation: PACU Anesthesia Type: General Level of consciousness: sedated Pain management: pain level controlled Vital Signs Assessment: post-procedure vital signs reviewed and stable Respiratory status: spontaneous breathing Cardiovascular status: stable Postop Assessment: no signs of nausea or vomiting Anesthetic complications: no     Last Vitals:  Filed Vitals:   03/04/16 1115 03/04/16 1130  BP: 125/59 116/70  Pulse: 96   Temp:    Resp: 18     Last Pain: There were no vitals filed for this visit. Pain Goal: Patients Stated Pain Goal: 4 (03/04/16 0604)               [place]

## 2016-03-04 NOTE — H&P (View-Only) (Signed)
Patient is a 50 year old presented to the office today to discuss her ultrasound that was done on April 24 here in our office in preparation for her upcoming laparoscopic left salpingo-oophorectomy and right salpingectomy as a result of persistent left ovarian cyst. Patient had been seen on February 20 for preoperative examination for her initial planned surgery in March but had to be rescheduled until next week. The following note reflects that office visit an examination with the addition of the ultrasound that was done here in the office yesterday:  Patient was last seen the office in 10/04/2015 and her history is as follows:  Patient had an ultrasound and sonohysterogram as a result of her irregular vaginal bleeding that she reported time of her annual exam on 09/10/2015. At that office visit she had an endometrial biopsy which demonstrated the following:  Diagnosis Endometrium, biopsy, endocervix/cervix - ATROPHIC ENDOMETRIUM WITH BREAKDOWN IN A BACKGROUND OF BLOOD CLOT, RARE BENIGN ENDOCERVICAL CELLS AND SQUAMOUS EPITHELIAL CELLS. - NO ENDOMETRIAL HYPERPLASIA, ATYPIA, CERVICAL DYSPLASIA OR MALIGNANCY IDENTIFIED.  her Pap smear November 2016 was normal. Patient then on 10/04/2015 had an ultrasound which demonstrated the following: Uterus measured 8.3 x 6.6 x 5.8 cm with endometrial stripe of 13.7 mm the cervix was cleansed with Betadine solution and a sterile catheter was introduced into the uterine cavity and no intracavitary defect was noted. The right ovary demonstrated a thin wall cyst with septation measuring 26 x 25 x 28 mm with negative color flow. Blood flow was seen to the right ovary otherwise. Left ovary was enlarged multiseptated cystic mass with echo-free areas measuring 5.7 x 5.7 x 5.6 cm and a reticular echo pattern additional cyst measuring 4.1 x 4.0 with arterial blood flow to the ovarian tissue but negative color flow on both masses. There was no fluid in the  cul-de-sac  Ultrasound on February 2017 demonstrated the following: Uterus measured 10.2 x 5.9 x 5.6 cm with endometrial stripe of 8.8 mm. Right ovary normal previous has not seen. Left ovary with a thin wall echo-free cyst measuring 20 x 16 19 mm, a thick wall corpus luteum cyst measuring 13 x 10 mm was noted also continued presence of a thinwall cystic mass measuring 59 x 64 x 54 mm average size 59 mm. Echogenic debris was noted negative color flow. Arterial blood flow was seen to left ovary. Previous reticular cystic mass not seen. No fluid in the cul-de-sac.  Patient had a normal ROMA-1 test December 2016 Patient had a knee replacement back in December. Review of her record also indicated that she has had history in the past of mild essential thrombocytosis and had been followed by Dr. Murriel Hopper who had recommend she be on baby aspirin. She last saw him back in 2011 and was released her from her care. She had been tested in the past for the JAK-2 mutation and was negative. Patient with past history resectoscopic myomectomy for prolapsing myoma several years ago. Patient reports the past 2 months her cycles of been normal.   Ultrasound 02/25/2016: Uterus measured 8.3 x 5.5 x 4.3 cm with endometrial stripe at 2.1 mm. Right ovary was normal. Left ovarian tissue with adjacent thinwall echo-free avascular cyst measuring 55 x 62 x 53 mm was noted with an average size of 56 mm. No significant change in size, previous echogenic debris was not noted this time on ultrasound. Positive color flow to the left ovary was noted. No fluid in the cul-de-sac.  Pertinent Gynecological History: Menses: Normal  Bleeding: Normal  Contraception: tubal ligation DES exposure: denies Blood transfusions: none Sexually transmitted diseases: no past history Previous GYN Procedures: 3 normal spontaneous vaginal deliveries resectoscopic myomectomy, tubal ligation  Last mammogram: normal Date: 2016st pap: normal Date:  3 OB History: G3 , P3   Menstrual History: Menarche age: 22  No LMP recorded.    Past Medical History  Diagnosis Date  . FIBROIDS, UTERUS   . GERD   . HYPERTENSION   . Obesity, unspecified   . Other abnormal glucose   . THROMBOCYTHEMIA   . NSVD (normal spontaneous vaginal delivery)     X3  . Diverticulosis of colon (without mention of hemorrhage)   . Diabetes mellitus     GESTATIONAL  . Arthritis     Past Surgical History  Procedure Laterality Date  . Knee arthroscopy Right 2006    Dr Ronnie Derby  . Resectoscopic polypectomy    . Hysteroscopy  06/19/2005    ASPIRATION OF LEFT PARATUBAL CYST  . Colonoscopy    . Tubal ligation  2006    FILSHIE CLIP TECHNIQUE  . Uterine fibroid surgery    . Total knee arthroplasty Right 10/10/2015    Procedure: TOTAL RIGHT KNEE ARTHROPLASTY; Surgeon: Frederik Pear, MD; Location: [facility]; Service: Orthopedics; Laterality: Right;    Family History  Problem Relation Age of Onset  . Breast cancer Mother   . Diabetes Father   . Hypertension Father   . Colon cancer Neg Hx   . Stomach cancer Neg Hx   . Rectal cancer Neg Hx     Social History:  reports that she has never smoked. She has never used smokeless tobacco. She reports that she drinks alcohol. She reports that she does not use illicit drugs.  Allergies: No Known Allergies   (Not in a hospital admission)  REVIEW OF SYSTEMS: A ROS was performed and pertinent positives and negatives are included in the history. GENERAL: No fevers or chills. HEENT: No change in vision, no earache, sore throat or sinus congestion. NECK: No pain or stiffness. CARDIOVASCULAR: No chest pain or pressure. No palpitations. PULMONARY: No shortness of breath, cough or wheeze. GASTROINTESTINAL: No abdominal pain, nausea, vomiting or diarrhea, melena or bright red blood per rectum. GENITOURINARY: No  urinary frequency, urgency, hesitancy or dysuria. MUSCULOSKELETAL: No joint or muscle pain, no back pain, no recent trauma. DERMATOLOGIC: No rash, no itching, no lesions. ENDOCRINE: No polyuria, polydipsia, no heat or cold intolerance. No recent change in weight. HEMATOLOGICAL: No anemia or easy bruising or bleeding. NEUROLOGIC: No headache, seizures, numbness, tingling or weakness. PSYCHIATRIC: No depression, no loss of interest in normal activity or change in sleep pattern.     Blood pressure 146/88.  Physical Exam:  HEENT:unremarkable Neck:Supple, midline, no thyroid megaly, no carotid bruits Lungs: Clear to auscultation no rhonchi's or wheezes Heart:Regular rate and rhythm, no murmurs or gallops Breast Exam:Examine November 2016 Pelvic exam: Bartholin urethra Skene was within normal limits Vagina: No lesions or discharge Cervix: No lesions or discharge Uterus anteverted upper limits of normal Adnexa: No palpable masses Rectal exam: Not done   Assessment/Plan: Patient with persistent left ovarian cyst is scheduled to undergo laparoscopic left salpingo-oophorectomy with right salpingectomy. The risks benefits and pros and cons of the operation as follows:   Patient was counseled as to the risk of surgery to include the following:  1. Infection (prohylactic antibiotics will be administered)  2. DVT/Pulmonary Embolism (prophylactic pneumo compression stockings will be used)  3.Trauma to internal organs requiring  additional surgical procedure to repair any injury to   Internal organs requiring perhaps additional hospitalization days.  4.Hemmorhage requiring transfusion and blood products which carry risks such as  anaphylactic reaction, hepatitis and AIDS  Patient had received literature information on the procedure scheduled and all her questions were answered and fully accepts all risk.

## 2016-03-04 NOTE — Anesthesia Preprocedure Evaluation (Signed)
Anesthesia Evaluation  Patient identified by MRN, date of birth, ID band Patient awake    Reviewed: Allergy & Precautions, H&P , NPO status , Patient's Chart, lab work & pertinent test results, reviewed documented beta blocker date and time   Airway Mallampati: II  TM Distance: >3 FB Neck ROM: full    Dental no notable dental hx. (+) Teeth Intact   Pulmonary neg pulmonary ROS,    Pulmonary exam normal        Cardiovascular hypertension, Pt. on medications and Pt. on home beta blockers Normal cardiovascular exam     Neuro/Psych negative neurological ROS  negative psych ROS   GI/Hepatic Neg liver ROS, GERD  Medicated and Controlled,  Endo/Other  negative endocrine ROS  Renal/GU negative Renal ROS     Musculoskeletal   Abdominal Normal abdominal exam  (+) + obese,   Peds  Hematology negative hematology ROS (+)   Anesthesia Other Findings   Reproductive/Obstetrics negative OB ROS                             Anesthesia Physical Anesthesia Plan  ASA: II  Anesthesia Plan: General   Post-op Pain Management:    Induction: Intravenous  Airway Management Planned: Oral ETT  Additional Equipment:   Intra-op Plan:   Post-operative Plan: Extubation in OR  Informed Consent: I have reviewed the patients History and Physical, chart, labs and discussed the procedure including the risks, benefits and alternatives for the proposed anesthesia with the patient or authorized representative who has indicated his/her understanding and acceptance.   Dental Advisory Given  Plan Discussed with: CRNA and Surgeon  Anesthesia Plan Comments:         Anesthesia Quick Evaluation

## 2016-03-04 NOTE — Op Note (Signed)
Operative Note  03/04/2016  9:39 AM  PATIENT:  [name]  50 y.o. female  PRE-OPERATIVE DIAGNOSIS:  bilateral ovarian cysts  POST-OPERATIVE DIAGNOSIS:  bilateral ovarian cysts  PROCEDURE:  Procedure(s): LAPAROSCOPIC BILATERAL SALPINGO OOPHORECTOMY with pelvic washings LYSIS OF ADHESION  SURGEON:  Surgeon(s): Terrance Mass, MD Anastasio Auerbach, MD  ANESTHESIA:   general  FINDINGS: Cul-de-sac adhesions inflammatory reaction on peritoneal surfaces. Evidence of prior tubal ligation all cut clips were visualized. Large 6 cm smooth surface left ovarian cyst adhered to the left pelvic sidewall and tube. Normal-appearing right ovary and tube. Smooth liver surface appendix not visualized  DESCRIPTION OF OPERATION: The patient was taken to the operating room where she underwent a successful general endotracheal anesthesia. A timeout was undertaken to properly identify the patient and voice I allowed the procedure to be performed. Patient had received 2 g Cefotan for prophylaxis and had PAS stockings for DVT prophylaxis. The abdomen vagina and perineum were prepped and draped under sterile fashion. Exam under anesthesia had confirmed a left adnexal fullness as well as an anteverted uterus. A single-tooth tenaculum and been placed her manipulation during laparoscopic procedure and a Foley catheter been inserted to monitor urinary output. Once the drapes were in place a small vertical incision was made in the infraumbilical region adjacent to previous laparoscopic scar. With the 10/Optiview trocar the peritoneum was entered and a pneumoperitoneum was established with 3-1/2 L or carbon dioxide. 2 additional 5 mm ports were made on the right and left lower abdomen under laparoscopic guidance. A systematic inspection demonstrated the following:  Cul-de-sac adhesions inflammatory reaction on peritoneal surfaces. Evidence of prior tubal ligation all cut clips were visualized. Large 6 cm smooth  surface left ovarian cyst adhered to the left pelvic sidewall and tube. Normal-appearing right ovary and tube. Smooth liver surface appendix not visualized.  With the Harmonic scalpel the cul-de-sac adhesions were coaptated and transected to free the left ovary that was adhered to the pelvic sidewall. The left tube and ovary were placed on tension to identify the left infundibulopelvic ligament. The left ureter was found distally from the area that was to be transected of the infundibulopelvic ligament. The infundibulopelvic ligament close to the ovary was coaptated and transected as was the left utero-ovarian ligament and the remaining mesosalpinx was coaptated and transected to free the entire left tube and ovary with a Hulka clip. Of note prior to this pelvic washings had been obtained and submitted for histological evaluation. Attention was then placed to the right adnexa once again the right tube and ovary were placed under tension the right ureter was identified distal and medial to the infundibulopelvic ligament close to the ovary which was coaptated and transected with Harmonic scalpel. The right utero-ovarian ligament was coaptated and transected to include also the proximal portion of the right fallopian tube. The remainder of the mesosalpinx was coaptated and transected with the Harmonic scalpel. Both specimens had been placed on the anterior cul-de-sac. Through the right 5 mm port a hysteroscope was introduced and through the 10/11 mm port site the Endopouch was inserted and both specimens were placed in the Endopouch and retrieved through the umbilicus. They were properly identified as submitted for histological evaluation. The pelvic cavity was then copiously irrigated with normal saline solution for additional hemostasis Arista was applied to all the raw surfaces. The pneumoperitoneum was removed all instruments were removed. The subumbilical fascia was closed with a running stitch of 0 Vicryl  suture and the  subcutaneous tissue was reapproximated with 3-0 Vicryl suture. All 3 port skin incision were reapproximated with Dermabond glue. 0.25% Marcaine was infiltrated all 3 port sites for a total 20 cc. The Hulka tenaculum was removed from the cervix and a small bleeding site was contained with silver nitrate. The Foley catheter was removed. Patient received 30 mg of Toradol IV in route to the recovery room after she was extubated. She was transferred was stable vital signs of blood loss minimal.     ESTIMATED BLOOD LOSS: Minimal   Intake/Output Summary (Last 24 hours) at 03/04/16 0939 Last data filed at 03/04/16 0931  Gross per 24 hour  Intake   1400 ml  Output    200 ml  Net   1200 ml     BLOOD ADMINISTERED:none   LOCAL MEDICATIONS USED:  MARCAINE   0.25% injected subdermally at all 3 incision ports for a total 20 cc for postop analgesia  SPECIMEN:  Source of Specimen:  Pelvic washings, bilateral tubes and ovaries  DISPOSITION OF SPECIMEN:  PATHOLOGY  COUNTS:  YES  PLAN OF CARE: Transfer to PACU  [facility] HMD9:39 AMTD@

## 2016-03-04 NOTE — Anesthesia Procedure Notes (Signed)
Procedure Name: Intubation Date/Time: 03/04/2016 7:26 AM Performed by: Ignacia Bayley Pre-anesthesia Checklist: Patient identified, Emergency Drugs available and Suction available Patient Re-evaluated:Patient Re-evaluated prior to inductionOxygen Delivery Method: Circle system utilized Preoxygenation: Pre-oxygenation with 100% oxygen Intubation Type: IV induction Ventilation: Oral airway inserted - appropriate to patient size Laryngoscope Size: Miller and 2 Grade View: Grade II Tube type: Oral Tube size: 7.0 mm Number of attempts: 1 Airway Equipment and Method: Stylet Placement Confirmation: ETT inserted through vocal cords under direct vision,  positive ETCO2 and breath sounds checked- equal and bilateral Secured at: 20 cm Tube secured with: Tape Dental Injury: Teeth and Oropharynx as per pre-operative assessment

## 2016-03-04 NOTE — Transfer of Care (Signed)
Immediate Anesthesia Transfer of Care Note  Patient: [name]  Procedure(s) Performed: Procedure(s): LAPAROSCOPIC BILATERAL SALPINGO OOPHORECTOMY with pelvic washings (Bilateral) LYSIS OF ADHESION (N/A)  Patient Location: PACU  Anesthesia Type:General  Level of Consciousness: sedated  Airway & Oxygen Therapy: Patient Spontanous Breathing and Patient connected to nasal cannula oxygen  Post-op Assessment: Report given to RN and Post -op Vital signs reviewed and stable  Post vital signs: stable  Last Vitals:  Filed Vitals:   03/04/16 0604 03/04/16 0646  BP:  153/84  Pulse: 92   Temp: 36.8 C   Resp: 20     Last Pain: There were no vitals filed for this visit.    Patients Stated Pain Goal: 4 ([id number])  Complications: No apparent anesthesia complications

## 2016-03-05 ENCOUNTER — Telehealth: Payer: Self-pay

## 2016-03-05 ENCOUNTER — Encounter (HOSPITAL_COMMUNITY): Payer: Self-pay | Admitting: Gynecology

## 2016-03-05 NOTE — Telephone Encounter (Signed)
-----   Message from Terrance Mass, MD sent at 03/05/2016 12:18 PM EDT ----- Please inform patient pathology report was benign

## 2016-03-05 NOTE — Telephone Encounter (Signed)
Hopefully not

## 2016-03-05 NOTE — Telephone Encounter (Signed)
Patient asked after lap BSO would she still have a period?

## 2016-03-12 ENCOUNTER — Telehealth: Payer: Self-pay | Admitting: *Deleted

## 2016-03-12 NOTE — Telephone Encounter (Signed)
Pt had Lap Salpingectomy on 03/04/16 has 2 questions:  1. Pt said the bandage from [place] area was easy to remove, but there is 2 area on the sides of stomach were she can't remove the bandage, pt asked will they dissolve?  2. Pt is leaving for trip for the beach tomorrow asked if you think its best for her to avoid beach water and pools? Please advise

## 2016-03-12 NOTE — Telephone Encounter (Signed)
Dressing should have been removed. The clear layer she sees is the dermabond glue which will fall of. Enjoy the beach. Swimming ok.

## 2016-03-12 NOTE — Telephone Encounter (Signed)
Pt informed with the below note. 

## 2016-03-20 ENCOUNTER — Ambulatory Visit (INDEPENDENT_AMBULATORY_CARE_PROVIDER_SITE_OTHER): Payer: 59 | Admitting: Gynecology

## 2016-03-20 ENCOUNTER — Encounter: Payer: Self-pay | Admitting: Gynecology

## 2016-03-20 VITALS — BP 140/90

## 2016-03-20 DIAGNOSIS — Z09 Encounter for follow-up examination after completed treatment for conditions other than malignant neoplasm: Secondary | ICD-10-CM

## 2016-03-20 NOTE — Progress Notes (Signed)
   Patient is a 50 year old who presented to the office for her two-week postop visit. Patient status post laparoscopic bilateral salpingo-oophorectomy with pelvic washings and lysis of pelvic adhesions as a result of bilateral ovarian cysts. Patient is doing well she is asymptomatic. She is menopausal but no vasomotor symptoms prior to her surgery or after. Findings from surgery as well as pictures were shared with the patient as well as the following pathology report:  FINDINGS: Cul-de-sac adhesions inflammatory reaction on peritoneal surfaces. Evidence of prior tubal ligation all cut clips were visualized. Large 6 cm smooth surface left ovarian cyst adhered to the left pelvic sidewall and tube. Normal-appearing right ovary and tube. Smooth liver surface appendix not visualized  Pathology report: Diagnosis 1. Ovary and fallopian tube, left, cyst - BENIGN OVARY WITH SEROUS CYSTADENOMA. - BENIGN FALLOPIAN TUBE. 2. Ovary and fallopian tube, right - BENIGN OVARY. - BENIGN FALLOPIAN TUBE WITH PARATUBAL CYSTS.  Exam: Abdomen: Soft nontender no rebound or guarding port sites completely healed Pelvic: Bartholin urethra Skene was within normal limits Vagina: No lesions or discharge Cervix: No lesions or discharge Uterus: Anteverted normal size and shape nontender Adnexa: No palpable masses or tenderness Rectal exam not done  Assessment/plan: Patient 2 weeks status post laparoscopic bilateral salpingo-oophorectomy for bilateral ovarian cyst. 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 Patient doing well. Patient is full normal activity. Patient scheduled for annual exam at the end of this year. Patient on no hormone replacement therapy and is asymptomatic

## 2016-03-28 ENCOUNTER — Telehealth: Payer: Self-pay | Admitting: *Deleted

## 2016-03-28 ENCOUNTER — Other Ambulatory Visit: Payer: Self-pay | Admitting: Internal Medicine

## 2016-03-28 MED FILL — PANTOPRAZOLE SOD DR 40 MG T: 40 | 90 days supply | Qty: 90 | Fill #0

## 2016-03-28 NOTE — Telephone Encounter (Signed)
Pt is post-op post laparoscopic bilateral salpingo-oophorectomy. Pt states at her incision site there is what appear to be white string that she removed a small piece. I spoke with Dr. Toney Rakes verbal message and he said it was fine since no bleeding nor pain nor drainage. I will relay to patient, he said if she would like she can come by and he will remove for her. Pt informed with the message.

## 2016-04-03 ENCOUNTER — Encounter: Payer: Self-pay | Admitting: Gastroenterology

## 2016-04-03 ENCOUNTER — Ambulatory Visit (INDEPENDENT_AMBULATORY_CARE_PROVIDER_SITE_OTHER): Payer: 59 | Admitting: Gastroenterology

## 2016-04-03 VITALS — BP 136/80 | HR 76 | Ht 66.0 in | Wt 241.0 lb

## 2016-04-03 DIAGNOSIS — D509 Iron deficiency anemia, unspecified: Secondary | ICD-10-CM | POA: Diagnosis not present

## 2016-04-03 NOTE — Patient Instructions (Signed)
If you are age 52 or older, your body mass index should be between 23-30. Your Body mass index is 38.92 kg/(m^2). If this is out of the aforementioned range listed, please consider follow up with your Primary Care Provider.  If you are age 87 or younger, your body mass index should be between 19-25. Your Body mass index is 38.92 kg/(m^2). If this is out of the aformentioned range listed, please consider follow up with your Primary Care Provider.   You have been scheduled for an endoscopy. Please follow written instructions given to you at your visit today. If you use inhalers (even only as needed), please bring them with you on the day of your procedure. Your physician has requested that you go to www.[email or web address].com and enter the access code given to you at your visit today. This web site gives a general overview about your procedure. However, you should still follow specific instructions given to you by our office regarding your preparation for the procedure.  Thank you for choosing [facility]  Dr Wilfrid Lund III

## 2016-04-03 NOTE — Progress Notes (Signed)
[facility] Progress Note  Chief Complaint: Iron deficiency anemia  Subjective History:  [name] sees me for the first time, she most recently saw Dr. Sharlett Iles for a colonoscopy in November 2014. The procedure was done because of rectal bleeding, it revealed some diverticulosis and he gave her some empiric Levsin for probable colonic spasm. On this occasion, she was referred for iron deficiency anemia. She had mild IDA with a ferritin of 11.5  and a low MCV in late November 2016. She got more anemic with a hemoglobin of 8.5 after knee replacement in early December 2016. She was not put on iron supplements, sore hemoglobin was still only 10.5 when seen by PCP in February 2017.  She has been taking iron supplements since then and was referred to [place]. She has occasional episodes of heartburn, but that resolves and she has been taking a PPI once daily since last year. She denies dysphagia odynophagia or early satiety nausea vomiting or weight loss. [name] was concerned about the anemia since her husband died from esophageal cancer last year. [name] also reports she has had elevated platelets for years, and had a hematology workup. They just put her on aspirin and said it should be checked periodically and needed no further attention unless it got over 1 million. ROS: Cardiovascular:  no chest pain Respiratory: no dyspnea She had heavy menstrual cycles until bilateral oophorectomy several months ago. She has been postmenopausal since then. The patient's Past Medical, Family and Social History were reviewed and are on file in the EMR.  Objective:  Med list reviewed  Vital signs in last 24 hrs: Filed Vitals:   04/03/16 1624  BP: 136/80  Pulse: 76    Physical Exam   HEENT: sclera anicteric, oral mucosa moist without lesions  Neck: supple, no thyromegaly, JVD or lymphadenopathy  Cardiac: RRR without murmurs, S1S2 heard, no peripheral edema  Pulm: clear to auscultation bilaterally,  normal RR and effort noted  Abdomen: Obese, soft, no tenderness, with active bowel sounds. No guarding or palpable hepatosplenomegaly.  Skin; warm and dry, no jaundice or rash  Recent Labs:  CBC    Component Value Date/Time   WBC 7.4 02/25/2016 1125   WBC 7.3 07/14/2011 1108   RBC 5.34* 02/25/2016 1125   RBC 4.75 07/14/2011 1108   HGB 12.9 02/25/2016 1125   HGB 14.1 07/14/2011 1108   HCT 41.0 02/25/2016 1125   HCT 41.7 07/14/2011 1108   PLT 737* 02/25/2016 1125   PLT 569* 07/14/2011 1108   MCV 76.8* 02/25/2016 1125   MCV 87.7 07/14/2011 1108   MCH 24.2* 02/25/2016 1125   MCH 29.8 07/14/2011 1108   MCHC 31.5 02/25/2016 1125   MCHC 33.9 07/14/2011 1108   RDW 24.2* 02/25/2016 1125   RDW 14.3 07/14/2011 1108   LYMPHSABS 2370 02/14/2016 1219   LYMPHSABS 1.9 07/14/2011 1108   MONOABS 790 02/14/2016 1219   MONOABS 0.6 07/14/2011 1108   EOSABS 316 02/14/2016 1219   EOSABS 0.2 07/14/2011 1108   BASOSABS 79 02/14/2016 1219   BASOSABS 0.0 07/14/2011 1108    See above for previous labs.   @ASSESSMENTPLANBEGIN @ Assessment: Encounter Diagnosis  Name Primary?  . Iron deficiency anemia Yes   I suspect she most likely had mild iron deficiency anemia from chronic menstrual blood loss, made worse by a postoperative blood loss from orthopedic surgery. There was and a delay before she got iron supplements. 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 Nevertheless, she takes aspirin daily and she has been iron deficient and we should rule out a source of GI blood loss. I don't feel another colonoscopy is necessary, since she had no polyps on a colonoscopy with good preparation less than 3 years ago.  Plan: EGD next week  The benefits and risks of the planned procedure were described in detail with the patient or (when appropriate) their health care proxy.  Risks were  outlined as including, but not limited to, bleeding, infection, perforation, adverse medication reaction leading to cardiac or pulmonary decompensation, or pancreatitis (if ERCP).  The limitation of incomplete mucosal visualization was also discussed.  No guarantees or warranties were given.   If normal, then this would seem not to be from chronic GI blood loss, she can stop iron and her PCP can follow blood counts.   Nelida Meuse III

## 2016-04-11 ENCOUNTER — Encounter: Payer: Self-pay | Admitting: Gastroenterology

## 2016-04-11 ENCOUNTER — Ambulatory Visit (AMBULATORY_SURGERY_CENTER): Payer: 59 | Admitting: Gastroenterology

## 2016-04-11 VITALS — BP 157/91 | HR 86 | Temp 99.5°F | Resp 13 | Ht 66.0 in | Wt 241.0 lb

## 2016-04-11 DIAGNOSIS — I10 Essential (primary) hypertension: Secondary | ICD-10-CM | POA: Diagnosis not present

## 2016-04-11 DIAGNOSIS — D509 Iron deficiency anemia, unspecified: Secondary | ICD-10-CM

## 2016-04-11 MED ORDER — SODIUM CHLORIDE 0.9 % IV SOLN: 500.0000 mL | INTRAVENOUS | Status: DC

## 2016-04-11 NOTE — Progress Notes (Signed)
Patient awakening,vss,report to rn 

## 2016-04-11 NOTE — Op Note (Signed)
[facility] Patient Name: [name] Procedure Date: 04/11/2016 11:52 AM MRN: [id number] Endoscopist: [place]. [name] , MD Age: 50 Referring MD:  Date of Birth: 05-07-1966 Gender: Female Procedure:                Upper GI endoscopy Indications:              Unexplained iron deficiency anemia Medicines:                Monitored Anesthesia Care Procedure:                Pre-Anesthesia Assessment:                           - Prior to the procedure, a History and Physical                            was performed, and patient medications and                            allergies were reviewed. The patient's tolerance of                            previous anesthesia was also reviewed. The risks                            and benefits of the procedure and the sedation                            options and risks were discussed with the patient.                            All questions were answered, and informed consent                            was obtained. Prior Anticoagulants: The patient has                            taken no previous anticoagulant or antiplatelet                            agents. ASA Grade Assessment: II - A patient with                            mild systemic disease. After reviewing the risks                            and benefits, the patient was deemed in                            satisfactory condition to undergo the procedure.                           After obtaining informed consent, the endoscope was  passed under direct vision. Throughout the                            procedure, the patient's blood pressure, pulse, and                            oxygen saturations were monitored continuously. The                            Model GIF-[id number]) scope was introduced                            through the mouth, and advanced to the second part                            of duodenum. The upper GI endoscopy was                             accomplished without difficulty. The patient                            tolerated the procedure well. Scope In: Scope Out: Findings:                 A small hiatal hernia was present.                           Multiple medium fundic gland polyps were found in                            the gastric body.                           The cardia and gastric fundus were normal on                            retroflexion.                           The examined duodenum was normal. Complications:            No immediate complications. Estimated Blood Loss:     Estimated blood loss: none. Impression:               - Small hiatal hernia.                           - Multiple gastric polyps.                           - Normal examined duodenum.                           - No specimens collected. Recommendation:           - Patient has a contact number available for  emergencies. The signs and symptoms of potential                            delayed complications were discussed with the                            patient. Return to normal activities tomorrow.                            Written discharge instructions were provided to the                            patient.                           - Resume previous diet.                           - Continue present medications except iron.                           Return to primary care physician in 6 weeks to                            check hemoglobin.                           see my recent clinic note Jamie Duda L. Jamie Carrow, MD 04/11/2016 12:16:47 PM This report has been signed electronically.

## 2016-04-11 NOTE — Patient Instructions (Signed)
Return to Primary Care Physician in 6 weeks to recheck your Hgb.    YOU HAD AN ENDOSCOPIC PROCEDURE TODAY AT [facility]:   Refer to the procedure report that was given to you for any specific questions about what was found during the examination.  If the procedure report does not answer your questions, please call your gastroenterologist to clarify.  If you requested that your care partner not be given the details of your procedure findings, then the procedure report has been included in a sealed envelope for you to review at your convenience later.  YOU SHOULD EXPECT: Some feelings of bloating in the abdomen. Passage of more gas than usual.  Walking can help get rid of the air that was put into your GI tract during the procedure and reduce the bloating. If you had a lower endoscopy (such as a colonoscopy or flexible sigmoidoscopy) you may notice spotting of blood in your stool or on the toilet paper. If you underwent a bowel prep for your procedure, you may not have a normal bowel movement for a few days.  Please Note:  You might notice some irritation and congestion in your nose or some drainage.  This is from the oxygen used during your procedure.  There is no need for concern and it should clear up in a day or so.  SYMPTOMS TO REPORT IMMEDIATELY:    Following upper endoscopy (EGD)  Vomiting of blood or coffee ground material  New chest pain or pain under the shoulder blades  Painful or persistently difficult swallowing  New shortness of breath  Fever of 100F or higher  Black, tarry-looking stools  For urgent or emergent issues, a gastroenterologist can be reached at any hour by calling [phone].   DIET: Your first meal following the procedure should be a small meal and then it is ok to progress to your normal diet. Heavy or fried foods are harder to digest and may make you feel nauseous or bloated.  Likewise, meals heavy in dairy and vegetables can increase  bloating.  Drink plenty of fluids but you should avoid alcoholic beverages for 24 hours.  ACTIVITY:  You should plan to take it easy for the rest of today and you should NOT DRIVE or use heavy machinery until tomorrow (because of the sedation medicines used during the test).    FOLLOW UP: Our staff will call the number listed on your records the next business day following your procedure to check on you and address any questions or concerns that you may have regarding the information given to you following your procedure. If we do not reach you, we will leave a message.  However, if you are feeling well and you are not experiencing any problems, there is no need to return our call.  We will assume that you have returned to your regular daily activities without incident.  If any biopsies were taken you will be contacted by phone or by letter within the next 1-3 weeks.  Please call us at [phone] if you have not heard about the biopsies in 3 weeks.    SIGNATURES/CONFIDENTIALITY: You and/or your care partner have signed paperwork which will be entered into your electronic medical record.  These signatures attest to the fact that that the information above on your After Visit Summary has been reviewed and is understood.  Full responsibility of the confidentiality of this discharge information lies with you and/or your care-partner.

## 2016-04-14 ENCOUNTER — Telehealth: Payer: Self-pay

## 2016-04-14 NOTE — Telephone Encounter (Signed)
  Follow up Call-  Call back number 04/11/2016 08/08/2013  Post procedure Call Back phone  # [phone]  Permission to leave phone message Yes Yes    Patient was called for follow up after her procedure on 04/11/2016. No answer at the number given for follow up phone call. A message was left on the answering machine.

## 2016-05-13 DIAGNOSIS — D225 Melanocytic nevi of trunk: Secondary | ICD-10-CM | POA: Diagnosis not present

## 2016-05-13 DIAGNOSIS — L918 Other hypertrophic disorders of the skin: Secondary | ICD-10-CM | POA: Diagnosis not present

## 2016-05-13 DIAGNOSIS — L821 Other seborrheic keratosis: Secondary | ICD-10-CM | POA: Diagnosis not present

## 2016-05-13 DIAGNOSIS — D1801 Hemangioma of skin and subcutaneous tissue: Secondary | ICD-10-CM | POA: Diagnosis not present

## 2016-05-13 DIAGNOSIS — D2262 Melanocytic nevi of left upper limb, including shoulder: Secondary | ICD-10-CM | POA: Diagnosis not present

## 2016-05-13 DIAGNOSIS — L814 Other melanin hyperpigmentation: Secondary | ICD-10-CM | POA: Diagnosis not present

## 2016-05-13 DIAGNOSIS — M25561 Pain in right knee: Secondary | ICD-10-CM | POA: Diagnosis not present

## 2016-05-13 DIAGNOSIS — L570 Actinic keratosis: Secondary | ICD-10-CM | POA: Diagnosis not present

## 2016-06-05 ENCOUNTER — Other Ambulatory Visit: Payer: Self-pay | Admitting: Internal Medicine

## 2016-06-05 ENCOUNTER — Telehealth: Payer: Self-pay | Admitting: *Deleted

## 2016-06-05 DIAGNOSIS — D509 Iron deficiency anemia, unspecified: Secondary | ICD-10-CM

## 2016-06-05 MED FILL — BENAZEPRIL HCL 10 MG TABLET: 10 | 90 days supply | Qty: 90 | Fill #0 | Status: TO

## 2016-06-05 MED FILL — AMLODIPINE BESYLATE 5 MG TA: 5 | 90 days supply | Qty: 90 | Fill #1 | Status: TO

## 2016-06-05 NOTE — Telephone Encounter (Signed)
Left msg on triage stating wanting to come in to have iron recheck. At the beginning of the year iron level was low due to issues/surgery that she had done, and was told to have iron recheck. Pls advise...[name]

## 2016-06-05 NOTE — Telephone Encounter (Signed)
Iron panel ordered

## 2016-06-05 NOTE — Telephone Encounter (Signed)
Notified pt MD place order to have iron levels check...[name]

## 2016-06-12 ENCOUNTER — Other Ambulatory Visit (INDEPENDENT_AMBULATORY_CARE_PROVIDER_SITE_OTHER): Payer: 59

## 2016-06-12 DIAGNOSIS — D509 Iron deficiency anemia, unspecified: Secondary | ICD-10-CM

## 2016-06-12 LAB — IBC PANEL
IRON: 75 ug/dL (ref 42–145)
SATURATION RATIOS: 15.6 % — AB (ref 20.0–50.0)
TRANSFERRIN: 344 mg/dL (ref 212.0–360.0)

## 2016-07-25 ENCOUNTER — Other Ambulatory Visit: Payer: Self-pay | Admitting: Gynecology

## 2016-07-25 MED FILL — NYSTATIN/TRIAMCINOLONE CRM: 100000-0.1 | 10 days supply | Qty: 30 | Fill #0

## 2016-07-30 MED FILL — PANTOPRAZOLE SOD DR 40 MG T: 40 | 90 days supply | Qty: 90 | Fill #1

## 2016-09-04 ENCOUNTER — Encounter: Payer: Self-pay | Admitting: Gynecology

## 2016-09-04 DIAGNOSIS — H5203 Hypermetropia, bilateral: Secondary | ICD-10-CM | POA: Diagnosis not present

## 2016-09-04 DIAGNOSIS — Z803 Family history of malignant neoplasm of breast: Secondary | ICD-10-CM | POA: Diagnosis not present

## 2016-09-04 DIAGNOSIS — Z1231 Encounter for screening mammogram for malignant neoplasm of breast: Secondary | ICD-10-CM | POA: Diagnosis not present

## 2016-09-09 MED FILL — BENAZEPRIL HCL 10 MG TABLET: 10 | 90 days supply | Qty: 90 | Fill #0 | Status: TO

## 2016-09-09 MED FILL — AMLODIPINE BESYLATE 5 MG TA: 5 | 90 days supply | Qty: 90 | Fill #0 | Status: TO

## 2016-10-09 ENCOUNTER — Ambulatory Visit (INDEPENDENT_AMBULATORY_CARE_PROVIDER_SITE_OTHER): Payer: 59 | Admitting: Gynecology

## 2016-10-09 ENCOUNTER — Encounter: Payer: Self-pay | Admitting: Gynecology

## 2016-10-09 ENCOUNTER — Other Ambulatory Visit: Payer: Self-pay | Admitting: Gynecology

## 2016-10-09 VITALS — BP 140/92 | Ht 66.0 in | Wt 242.0 lb

## 2016-10-09 DIAGNOSIS — Z23 Encounter for immunization: Secondary | ICD-10-CM | POA: Diagnosis not present

## 2016-10-09 DIAGNOSIS — Z01419 Encounter for gynecological examination (general) (routine) without abnormal findings: Secondary | ICD-10-CM

## 2016-10-09 DIAGNOSIS — Z780 Asymptomatic menopausal state: Secondary | ICD-10-CM

## 2016-10-09 DIAGNOSIS — R748 Abnormal levels of other serum enzymes: Secondary | ICD-10-CM | POA: Diagnosis not present

## 2016-10-09 LAB — LIPID PANEL
CHOL/HDL RATIO: 3.5 ratio (ref <5.0)
Cholesterol: 176 mg/dL (ref <200)
HDL: 50 mg/dL — ABNORMAL LOW (ref >50)
LDL Cholesterol: 99 mg/dL (ref <100)
Triglycerides: 133 mg/dL (ref <150)
VLDL: 27 mg/dL (ref <30)

## 2016-10-09 LAB — CBC WITH DIFFERENTIAL/PLATELET
BASOS PCT: 0 %
Basophils Absolute: 0 cells/uL (ref 0–200)
EOS PCT: 4 %
Eosinophils Absolute: 312 cells/uL (ref 15–500)
HCT: 44 % (ref 35.0–45.0)
Hemoglobin: 14.4 g/dL (ref 11.7–15.5)
Lymphocytes Relative: 21 %
Lymphs Abs: 1638 cells/uL (ref 850–3900)
MCH: 28 pg (ref 27.0–33.0)
MCHC: 32.7 g/dL (ref 32.0–36.0)
MCV: 85.6 fL (ref 80.0–100.0)
MONOS PCT: 10 %
MPV: 9.8 fL (ref 7.5–12.5)
Monocytes Absolute: 780 cells/uL (ref 200–950)
NEUTROS ABS: 5070 {cells}/uL (ref 1500–7800)
Neutrophils Relative %: 65 %
PLATELETS: 704 10*3/uL — AB (ref 140–400)
RBC: 5.14 MIL/uL — AB (ref 3.80–5.10)
RDW: 14.5 % (ref 11.0–15.0)
WBC: 7.8 10*3/uL (ref 3.8–10.8)

## 2016-10-09 LAB — COMPREHENSIVE METABOLIC PANEL
ALK PHOS: 69 U/L (ref 33–130)
ALT: 49 U/L — AB (ref 6–29)
AST: 43 U/L — ABNORMAL HIGH (ref 10–35)
Albumin: 4.6 g/dL (ref 3.6–5.1)
BUN: 13 mg/dL (ref 7–25)
CHLORIDE: 99 mmol/L (ref 98–110)
CO2: 28 mmol/L (ref 20–31)
CREATININE: 0.96 mg/dL (ref 0.50–1.05)
Calcium: 10.2 mg/dL (ref 8.6–10.4)
Glucose, Bld: 146 mg/dL — ABNORMAL HIGH (ref 65–99)
Potassium: 4.8 mmol/L (ref 3.5–5.3)
SODIUM: 138 mmol/L (ref 135–146)
TOTAL PROTEIN: 7.5 g/dL (ref 6.1–8.1)
Total Bilirubin: 0.5 mg/dL (ref 0.2–1.2)

## 2016-10-09 LAB — TSH: TSH: 4.08 m[IU]/L

## 2016-10-09 MED ORDER — NYSTATIN-TRIAMCINOLONE 100000-0.1 UNIT/GM-% EX CREA: TOPICAL_CREAM | Freq: Two times a day (BID) | CUTANEOUS | 2 refills | Status: DC

## 2016-10-09 NOTE — Addendum Note (Signed)
Addended by: Thurnell Garbe A on: 10/09/2016 09:29 AM   Modules accepted: Orders

## 2016-10-09 NOTE — Progress Notes (Signed)
[name] 02-14-1966 [id number]   History:    50 y.o.  for annual gyn exam with no complaints today. Review of her record indicated the following: Patient had previously been followed by Dr. Cathlean Cower who was treating her for hypertension. She takes Norvasc 5 mg daily. She has not had a menstrual period now and close to a year. Also review her records indicated that has had history in the past of mild essential thrombocytosis and had been followed by Dr. Murriel Hopper who had recommend she be on baby aspirin. She last saw him back in 2011 and was released her from her care. She had been tested in the past for the JAK-2 mutation and was negative. Patient with past history resectoscopic myomectomy for prolapsing myoma several years ago. Patient with no previous history of any abnormal Pap smear. Patient's mother with history of breast cancer. Patient requesting flu vaccine today.  Patient has done well from her surgery May of this year where she had laparoscopic bilateral salpingo-oophorectomy as a result of bilateral ovarian cyst. Her pathology reported demonstrated the following:  Pathology report: Diagnosis 1. Ovary and fallopian tube, left, cyst - BENIGN OVARY WITH SEROUS CYSTADENOMA. - BENIGN FALLOPIAN TUBE. 2. Ovary and fallopian tube, right - BENIGN OVARY. - BENIGN FALLOPIAN TUBE WITH PARATUBAL CYSTS.  Patient with no vasomotor symptoms on no hormone replacement therapy. She had a colonoscopy in 2014 which was benign. She stated also because of her anemia workup this year she had a negative endoscopy.  Past medical history,surgical history, family history and social history were all reviewed and documented in the EPIC chart.  Gynecologic History No LMP recorded. Patient is not currently having periods (Reason: Perimenopausal). Contraception: post menopausal status Last Pap: 2016. Results were: normal Last mammogram: 2017. Results were: normal  Obstetric History OB  History  Gravida Para Term Preterm AB Living  3 3 3     3   SAB TAB Ectopic Multiple Live Births          3    # Outcome Date GA Lbr Len/2nd Weight Sex Delivery Anes PTL Lv  3 Term     M Vag-Spont  N LIV  2 Term     F Vag-Spont  N LIV  1 Term     F Vag-Spont  N LIV       ROS: A ROS was performed and pertinent positives and negatives are included in the history.  GENERAL: No fevers or chills. HEENT: No change in vision, no earache, sore throat or sinus congestion. NECK: No pain or stiffness. CARDIOVASCULAR: No chest pain or pressure. No palpitations. PULMONARY: No shortness of breath, cough or wheeze. GASTROINTESTINAL: No abdominal pain, nausea, vomiting or diarrhea, melena or bright red blood per rectum. GENITOURINARY: No urinary frequency, urgency, hesitancy or dysuria. MUSCULOSKELETAL: No joint or muscle pain, no back pain, no recent trauma. DERMATOLOGIC: No rash, no itching, no lesions. ENDOCRINE: No polyuria, polydipsia, no heat or cold intolerance. No recent change in weight. HEMATOLOGICAL: No anemia or easy bruising or bleeding. NEUROLOGIC: No headache, seizures, numbness, tingling or weakness. PSYCHIATRIC: No depression, no loss of interest in normal activity or change in sleep pattern.     Exam: chaperone present  BP (!) 140/92   Ht 5\' 6"  (1.676 m)   Wt 242 lb (109.8 kg)   BMI 39.06 kg/m   Body mass index is 39.06 kg/m.  General appearance : Well developed well nourished female. No acute distress HEENT: Eyes: no  retinal hemorrhage or exudates,  Neck supple, trachea midline, no carotid bruits, no thyroidmegaly Lungs: Clear to auscultation, no rhonchi or wheezes, or rib retractions  Heart: Regular rate and rhythm, no murmurs or gallops Breast:Examined in sitting and supine position were symmetrical in appearance, no palpable masses or tenderness,  no skin retraction, no nipple inversion, no nipple discharge, no skin discoloration, no axillary or supraclavicular  lymphadenopathy Abdomen: no palpable masses or tenderness, no rebound or guarding Extremities: no edema or skin discoloration or tenderness  Pelvic:  Bartholin, Urethra, Skene Glands: Within normal limits             Vagina: No gross lesions or discharge  Cervix: No gross lesions or discharge  Uterus  anteverted, normal size, shape and consistency, non-tender and mobile  Adnexa  Without masses or tenderness  Anus and perineum  normal   Rectovaginal  normal sphincter tone without palpated masses or tenderness             Hemoccult cards will be provided     Assessment/Plan:  50 y.o. female for annual exam will have the following fasting blood work today: Fasting lipid profile, comprehensive metabolic panel, TSH, CBC, and urinalysis. Pap smear not done today according to the new guidelines. Patient was reminded of the importance of calcium vitamin D and weightbearing exercise for osteoporosis prevention. Patient requested and received the flu vaccine today after she was counseled.   Terrance Mass MD, 8:40 AM 10/09/2016

## 2016-10-09 NOTE — Patient Instructions (Signed)
Influenza Virus Vaccine (Flucelvax) What is this medicine? INFLUENZA VIRUS VACCINE (in floo EN zuh VAHY ruhs vak SEEN) helps to reduce the risk of getting influenza also known as the flu. The vaccine only helps protect you against some strains of the flu. COMMON BRAND NAME(S): FLUCELVAX What should I tell my health care provider before I take this medicine? They need to know if you have any of these conditions: -bleeding disorder like hemophilia -fever or infection -Guillain-Barre syndrome or other neurological problems -immune system problems -infection with the human immunodeficiency virus (HIV) or AIDS -low blood platelet counts -multiple sclerosis -an unusual or allergic reaction to influenza virus vaccine, other medicines, foods, dyes or preservatives -pregnant or trying to get pregnant -breast-feeding How should I use this medicine? This vaccine is for injection into a muscle. It is given by a health care professional. A copy of Vaccine Information Statements will be given before each vaccination. Read this sheet carefully each time. The sheet may change frequently. Talk to your pediatrician regarding the use of this medicine in children. Special care may be needed. Overdosage: If you think you've taken too much of this medicine contact a poison control center or emergency room at once. What if I miss a dose? This does not apply. What may interact with this medicine? -chemotherapy or radiation therapy -medicines that lower your immune system like etanercept, anakinra, infliximab, and adalimumab -medicines that treat or prevent blood clots like warfarin -phenytoin -steroid medicines like prednisone or cortisone -theophylline -vaccines What should I watch for while using this medicine? Report any side effects that do not go away within 3 days to your doctor or health care professional. Call your health care provider if any unusual symptoms occur within 6 weeks of receiving this  vaccine. You may still catch the flu, but the illness is not usually as bad. You cannot get the flu from the vaccine. The vaccine will not protect against colds or other illnesses that may cause fever. The vaccine is needed every year. What side effects may I notice from receiving this medicine? Side effects that you should report to your doctor or health care professional as soon as possible: -allergic reactions like skin rash, itching or hives, swelling of the face, lips, or tongue Side effects that usually do not require medical attention (Report these to your doctor or health care professional if they continue or are bothersome.): -fever -headache -muscle aches and pains -pain, tenderness, redness, or swelling at the injection site -tiredness Where should I keep my medicine? The vaccine will be given by a health care professional in a clinic, pharmacy, doctor's office, or other health care setting. You will not be given vaccine doses to store at home.  2017 Elsevier/Gold Standard (2011-10-01 14:06:47)  

## 2016-10-10 LAB — URINALYSIS W MICROSCOPIC + REFLEX CULTURE
Bilirubin Urine: NEGATIVE
CASTS: NONE SEEN [LPF]
CRYSTALS: NONE SEEN [HPF]
Glucose, UA: NEGATIVE
Hgb urine dipstick: NEGATIVE
KETONES UR: NEGATIVE
Leukocytes, UA: NEGATIVE
Nitrite: NEGATIVE
Protein, ur: NEGATIVE
SPECIFIC GRAVITY, URINE: 1.024 (ref 1.001–1.035)
Yeast: NONE SEEN [HPF]
pH: 5.5 (ref 5.0–8.0)

## 2016-10-10 LAB — HEPATITIS PANEL, ACUTE
HCV AB: NEGATIVE
HEP B S AG: NEGATIVE
Hep A IgM: NONREACTIVE
Hep B C IgM: NONREACTIVE

## 2016-10-11 LAB — URINE CULTURE: ORGANISM ID, BACTERIA: NO GROWTH

## 2016-10-31 MED FILL — PANTOPRAZOLE SOD DR 40 MG T: 40 | 90 days supply | Qty: 90 | Fill #2

## 2016-10-31 MED FILL — NYSTATIN-TRIAMCINOLONE CRM: 100000-0.1 | 10 days supply | Qty: 30 | Fill #1

## 2016-11-25 ENCOUNTER — Ambulatory Visit: Payer: 59 | Admitting: Family Medicine

## 2016-12-09 MED FILL — BENAZEPRIL HCL 10 MG TABLET: 10 | 90 days supply | Qty: 90 | Fill #0

## 2016-12-09 MED FILL — AMLODIPINE BESYLATE 5 MG TA: 5 | 90 days supply | Qty: 90 | Fill #0

## 2016-12-30 ENCOUNTER — Ambulatory Visit (INDEPENDENT_AMBULATORY_CARE_PROVIDER_SITE_OTHER): Payer: 59 | Admitting: Family Medicine

## 2016-12-30 ENCOUNTER — Encounter: Payer: Self-pay | Admitting: Family Medicine

## 2016-12-30 DIAGNOSIS — D473 Essential (hemorrhagic) thrombocythemia: Secondary | ICD-10-CM | POA: Diagnosis not present

## 2016-12-30 DIAGNOSIS — D508 Other iron deficiency anemias: Secondary | ICD-10-CM | POA: Diagnosis not present

## 2016-12-30 DIAGNOSIS — Z0000 Encounter for general adult medical examination without abnormal findings: Secondary | ICD-10-CM | POA: Diagnosis not present

## 2016-12-30 DIAGNOSIS — K219 Gastro-esophageal reflux disease without esophagitis: Secondary | ICD-10-CM | POA: Diagnosis not present

## 2016-12-30 DIAGNOSIS — M1711 Unilateral primary osteoarthritis, right knee: Secondary | ICD-10-CM | POA: Diagnosis not present

## 2016-12-30 DIAGNOSIS — I10 Essential (primary) hypertension: Secondary | ICD-10-CM

## 2016-12-30 DIAGNOSIS — Z114 Encounter for screening for human immunodeficiency virus [HIV]: Secondary | ICD-10-CM | POA: Diagnosis not present

## 2016-12-30 DIAGNOSIS — R7302 Impaired glucose tolerance (oral): Secondary | ICD-10-CM

## 2016-12-30 NOTE — Progress Notes (Signed)
Subjective:    Patient ID: [name], female    DOB: February 01, 1966, 51 y.o.   MRN: [id number]  12/30/2016  Annual Exam   HPI This 51 y.o. female presents for Complete Physical Examination.   Last physical:  12-27-15 Pap smear: [facility] Mammogram:  09-04-2016 [facility]andez Colonoscopy:  08-08-2013  Immunization History  Administered Date(s) Administered  . Influenza Split 09/17/2011  . Influenza Whole 09/18/2008, 09/17/2009  . Influenza, Seasonal, Injecte, Preservative Fre 10/04/2012  . Influenza,inj,Quad PF,36+ Mos 09/07/2014, 09/10/2015, 10/09/2016  . Pneumococcal Polysaccharide-23 03/19/2009  . Td 09/13/2007   BP Readings from Last 3 Encounters:  10/09/16 (!) 140/92  04/11/16 (!) 157/91  04/03/16 136/80   Wt Readings from Last 3 Encounters:  10/09/16 242 lb (109.8 kg)  04/11/16 241 lb (109.3 kg)  04/03/16 241 lb (109.3 kg)     Review of Systems  Constitutional: Negative for activity change, appetite change, chills, diaphoresis, fatigue, fever and unexpected weight change.  HENT: Negative for congestion, dental problem, drooling, ear discharge, ear pain, facial swelling, hearing loss, mouth sores, nosebleeds, postnasal drip, rhinorrhea, sinus pressure, sneezing, sore throat, tinnitus, trouble swallowing and voice change.   Eyes: Negative for photophobia, pain, discharge, redness, itching and visual disturbance.  Respiratory: Negative for apnea, cough, choking, chest tightness, shortness of breath, wheezing and stridor.   Cardiovascular: Negative for chest pain, palpitations and leg swelling.  Gastrointestinal: Negative for abdominal distention, abdominal pain, anal bleeding, blood in stool, constipation, diarrhea, nausea, rectal pain and vomiting.  Endocrine: Negative for cold intolerance, heat intolerance, polydipsia, polyphagia and polyuria.  Genitourinary: Negative for decreased urine volume, difficulty urinating, dyspareunia, dysuria, enuresis, flank pain,  frequency, genital sores, hematuria, menstrual problem, pelvic pain, urgency, vaginal bleeding, vaginal discharge and vaginal pain.  Musculoskeletal: Negative for arthralgias, back pain, gait problem, joint swelling, myalgias, neck pain and neck stiffness.  Skin: Negative for color change, pallor, rash and wound.  Allergic/Immunologic: Negative for environmental allergies, food allergies and immunocompromised state.  Neurological: Negative for dizziness, tremors, seizures, syncope, facial asymmetry, speech difficulty, weakness, light-headedness, numbness and headaches.  Hematological: Negative for adenopathy. Does not bruise/bleed easily.  Psychiatric/Behavioral: Negative for agitation, behavioral problems, confusion, decreased concentration, dysphoric mood, hallucinations, self-injury, sleep disturbance and suicidal ideas. The patient is not nervous/anxious and is not hyperactive.     Past Medical History:  Diagnosis Date  . Anemia   . Arthritis   . Diverticulosis of colon (without mention of hemorrhage)   . FIBROIDS, UTERUS   . GERD   . HYPERTENSION   . NSVD (normal spontaneous vaginal delivery)    X3  . Obesity, unspecified   . Other abnormal glucose   . THROMBOCYTHEMIA    Past Surgical History:  Procedure Laterality Date  . COLONOSCOPY    . DIAGNOSTIC LAPAROSCOPY    . HYSTEROSCOPY  06/19/2005   ASPIRATION OF LEFT PARATUBAL CYST  . KNEE ARTHROSCOPY Right 2006   Dr Ronnie Derby  . LAPAROSCOPIC BILATERAL SALPINGO OOPHERECTOMY Bilateral 03/04/2016   Procedure: LAPAROSCOPIC BILATERAL SALPINGO OOPHORECTOMY with pelvic washings;  Surgeon: Terrance Mass, MD;  Location: [facility];  Service: Gynecology;  Laterality: Bilateral;  . LYSIS OF ADHESION N/A 03/04/2016   Procedure: LYSIS OF ADHESION;  Surgeon: Terrance Mass, MD;  Location: [facility];  Service: Gynecology;  Laterality: N/A;  . RESECTOSCOPIC POLYPECTOMY    . TOTAL KNEE ARTHROPLASTY Right 10/10/2015   Procedure: TOTAL RIGHT KNEE  ARTHROPLASTY;  Surgeon: Frederik Pear, MD;  Location: [facility];  Service: Orthopedics;  Laterality: Right;  .  TUBAL LIGATION  2006   FILSHIE CLIP TECHNIQUE  . UTERINE FIBROID SURGERY     No Known Allergies Current Outpatient Prescriptions  Medication Sig Dispense Refill  . amLODipine (NORVASC) 5 MG tablet Take 1 tablet (5 mg total) by mouth daily. 90 tablet 1  . aspirin 81 MG tablet Take 81 mg by mouth at bedtime.     . benazepril (LOTENSIN) 10 MG tablet Take 1 tablet (10 mg total) by mouth daily. 90 tablet 1  . calcium-vitamin D (OSCAL WITH D) 500-200 MG-UNIT tablet Take 1 tablet by mouth at bedtime.    Marland Kitchen loratadine (ALLERGY) 10 MG tablet Take 10 mg by mouth at bedtime.     Marland Kitchen nystatin-triamcinolone (MYCOLOG II) cream Apply topically 2 (two) times daily. 30 g 2  . pantoprazole (PROTONIX) 40 MG tablet Take 1 tablet (40 mg total) by mouth daily. 90 tablet 3   No current facility-administered medications for this visit.    Social History   Social History  . Marital status: Widowed    Spouse name: N/A  . Number of children: 3  . Years of education: N/A   Occupational History  . homemaker    Social History Main Topics  . Smoking status: Never Smoker  . Smokeless tobacco: Never Used  . Alcohol use 0.0 oz/week     Comment: occassionally  . Drug use: No  . Sexual activity: Yes     Comment: TUBAL LIGATION   Other Topics Concern  . Not on file   Social History Narrative   Husband is Podiatrist   Family History  Problem Relation Age of Onset  . Breast cancer Mother   . Diabetes Father   . Hypertension Father   . Heart disease Father   . Diabetes Maternal Grandmother   . Colon cancer Neg Hx   . Stomach cancer Neg Hx   . Rectal cancer Neg Hx        Objective:    There were no vitals taken for this visit. Physical Exam  Constitutional: She is oriented to person, place, and time. She appears well-developed and well-nourished. No distress.  HENT:  Head: Normocephalic and  atraumatic.  Right Ear: External ear normal.  Left Ear: External ear normal.  Nose: Nose normal.  Mouth/Throat: Oropharynx is clear and moist.  Eyes: Conjunctivae and EOM are normal. Pupils are equal, round, and reactive to light.  Neck: Normal range of motion and full passive range of motion without pain. Neck supple. No JVD present. Carotid bruit is not present. No thyromegaly present.  Cardiovascular: Normal rate, regular rhythm and normal heart sounds.  Exam reveals no gallop and no friction rub.   No murmur heard. Pulmonary/Chest: Effort normal and breath sounds normal. She has no wheezes. She has no rales.  Abdominal: Soft. Bowel sounds are normal. She exhibits no distension and no mass. There is no tenderness. There is no rebound and no guarding.  Musculoskeletal:       Right shoulder: Normal.       Left shoulder: Normal.       Cervical back: Normal.  Lymphadenopathy:    She has no cervical adenopathy.  Neurological: She is alert and oriented to person, place, and time. She has normal reflexes. No cranial nerve deficit. She exhibits normal muscle tone. Coordination normal.  Skin: Skin is warm and dry. No rash noted. She is not diaphoretic. No erythema. No pallor.  Psychiatric: She has a normal mood and affect. Her behavior is normal. Judgment  and thought content normal.  Nursing note and vitals reviewed.  Depression screen [facility] 2/9 12/30/2016 12/15/2014 12/14/2013  Decreased Interest 0 0 0  Down, Depressed, Hopeless 0 0 1  PHQ - 2 Score 0 0 1   Fall Risk  12/30/2016 12/15/2014 12/14/2013  Falls in the past year? No No No        Assessment & Plan:   1. Routine physical examination   2. Essential hypertension   3. Gastroesophageal reflux disease without esophagitis   4. Impaired glucose tolerance   5. Primary osteoarthritis of right knee   6. THROMBOCYTHEMIA   7. Iron deficiency anemia secondary to inadequate dietary iron intake   8. Screening for HIV (human immunodeficiency  virus)    -anticipatory guidance provided ---exercise, weight loss, 1200-1500 kcal diet; recommend https://www.matthews.info/ -obtain labs for age appropriate screening and for chronic disease states. -continue current medications.  -coping relatively well with death of spouse in February 02, 2015.     Orders Placed This Encounter  Procedures  . HIV antibody  . Iron and TIBC  . CBC with Differential/Platelet  . Comprehensive metabolic panel  . Hemoglobin A1c  . Microalbumin, urine  . POCT urinalysis dipstick   Meds ordered this encounter  Medications  . amLODipine (NORVASC) 5 MG tablet    Sig: Take 1 tablet (5 mg total) by mouth daily.    Dispense:  90 tablet    Refill:  1  . benazepril (LOTENSIN) 10 MG tablet    Sig: Take 1 tablet (10 mg total) by mouth daily.    Dispense:  90 tablet    Refill:  1  . pantoprazole (PROTONIX) 40 MG tablet    Sig: Take 1 tablet (40 mg total) by mouth daily.    Dispense:  90 tablet    Refill:  3    Return in about 6 months (around 06/29/2017) for recheck HIGH BLOOD PRESSURE.

## 2016-12-30 NOTE — Patient Instructions (Addendum)
[email or web address]   IF you received an x-ray today, you will receive an invoice from [facility]. Please contact [facility] at [phone] with questions or concerns regarding your invoice.   IF you received labwork today, you will receive an invoice from [facility]. Please contact [facility] at [phone] with questions or concerns regarding your invoice.   Our billing staff will not be able to assist you with questions regarding bills from these companies.  You will be contacted with the lab results as soon as they are available. The fastest way to get your results is to activate your My Chart account. Instructions are located on the last page of this paperwork. If you have not heard from [place] regarding the results in 2 weeks, please contact this office.    Keeping You Healthy  Get These Tests  Blood Pressure- Have your blood pressure checked by your healthcare provider at least once a year.  Normal blood pressure is 120/80.  Weight- Have your body mass index (BMI) calculated to screen for obesity.  BMI is a measure of body fat based on height and weight.  You can calculate your own BMI at [employer]velBags.it  Cholesterol- Have your cholesterol checked every year.  Diabetes- Have your blood sugar checked every year if you have high blood pressure, high cholesterol, a family history of diabetes or if you are overweight.  Pap Test - Have a pap test every 1 to 5 years if you have been sexually active.  If you are older than 65 and recent pap tests have been normal you may not need additional pap tests.  In addition, if you have had a hysterectomy  for benign disease additional pap tests are not necessary.  Mammogram-Yearly mammograms are essential for early detection of breast cancer  Screening for Colon Cancer- Colonoscopy starting at age 80. Screening may begin sooner depending on your family history and other health conditions.  Follow up colonoscopy as directed  by your Gastroenterologist.  Screening for Osteoporosis- Screening begins at age 66 with bone density scanning, sooner if you are at higher risk for developing Osteoporosis.  Get these medicines  Calcium with Vitamin D- Your body requires 1200-1500 mg of Calcium a day and [phone] IU of Vitamin D a day.  You can only absorb 500 mg of Calcium at a time therefore Calcium must be taken in 2 or 3 separate doses throughout the day.  Hormones- Hormone therapy has been associated with increased risk for certain cancers and heart disease.  Talk to your healthcare provider about if you need relief from menopausal symptoms.  Aspirin- Ask your healthcare provider about taking Aspirin to prevent Heart Disease and Stroke.  Get these Immuniztions  Flu shot- Every fall  Pneumonia shot- Once after the age of 97; if you are younger ask your healthcare provider if you need a pneumonia shot.  Tetanus- Every ten years.  Zostavax- Once after the age of 50 to prevent shingles.  Take these steps  Don't smoke- Your healthcare provider can help you quit. For tips on how to quit, ask your healthcare provider or go to www.smokefree.gov or call 1-800 QUIT-NOW.  Be physically active- Exercise 5 days a week for a minimum of 30 minutes.  If you are not already physically active, start slow and gradually work up to 30 minutes of moderate physical activity.  Try walking, dancing, bike riding, swimming, etc.  Eat a healthy diet- Eat a variety of healthy foods such as fruits, vegetables, whole grains, low fat  milk, low fat cheeses, yogurt, lean meats, chicken, fish, eggs, dried beans, tofu, etc.  For more information go to www.thenutritionsource.org  Dental visit- Brush and floss teeth twice daily; visit your dentist twice a year.  Eye exam- Visit your Optometrist or Ophthalmologist yearly.  Drink alcohol in moderation- Limit alcohol intake to one drink or less a day.  Never drink and drive.  Depression- Your  emotional health is as important as your physical health.  If you're feeling down or losing interest in things you normally enjoy, please talk to your healthcare provider.  Seat Belts- can save your life; always wear one  Smoke/Carbon Monoxide detectors- These detectors need to be installed on the appropriate level of your home.  Replace batteries at least once a year.  Violence- If anyone is threatening or hurting you, please tell your healthcare provider.  Living Will/ Health care power of attorney- Discuss with your healthcare provider and family.

## 2016-12-31 LAB — CBC WITH DIFFERENTIAL/PLATELET
BASOS: 1 %
Basophils Absolute: 0 10*3/uL (ref 0.0–0.2)
EOS (ABSOLUTE): 0.3 10*3/uL (ref 0.0–0.4)
Eos: 5 %
HEMOGLOBIN: 15 g/dL (ref 11.1–15.9)
Hematocrit: 45.6 % (ref 34.0–46.6)
IMMATURE GRANS (ABS): 0 10*3/uL (ref 0.0–0.1)
IMMATURE GRANULOCYTES: 0 %
LYMPHS: 24 %
Lymphocytes Absolute: 1.6 10*3/uL (ref 0.7–3.1)
MCH: 28.3 pg (ref 26.6–33.0)
MCHC: 32.9 g/dL (ref 31.5–35.7)
MCV: 86 fL (ref 79–97)
Monocytes Absolute: 0.4 10*3/uL (ref 0.1–0.9)
Monocytes: 7 %
NEUTROS ABS: 4.3 10*3/uL (ref 1.4–7.0)
NEUTROS PCT: 63 %
Platelets: 724 10*3/uL — ABNORMAL HIGH (ref 150–379)
RBC: 5.3 x10E6/uL — ABNORMAL HIGH (ref 3.77–5.28)
RDW: 15.4 % (ref 12.3–15.4)
WBC: 6.6 10*3/uL (ref 3.4–10.8)

## 2016-12-31 LAB — MICROALBUMIN, URINE: MICROALBUM., U, RANDOM: 610.7 ug/mL

## 2016-12-31 LAB — COMPREHENSIVE METABOLIC PANEL
A/G RATIO: 1.6 (ref 1.2–2.2)
ALBUMIN: 4.8 g/dL (ref 3.5–5.5)
ALT: 50 IU/L — AB (ref 0–32)
AST: 36 IU/L (ref 0–40)
Alkaline Phosphatase: 81 IU/L (ref 39–117)
BILIRUBIN TOTAL: 0.3 mg/dL (ref 0.0–1.2)
BUN / CREAT RATIO: 11 (ref 9–23)
BUN: 11 mg/dL (ref 6–24)
CALCIUM: 10.5 mg/dL — AB (ref 8.7–10.2)
CHLORIDE: 97 mmol/L (ref 96–106)
CO2: 25 mmol/L (ref 18–29)
Creatinine, Ser: 1.01 mg/dL — ABNORMAL HIGH (ref 0.57–1.00)
GFR, EST AFRICAN AMERICAN: 74 mL/min/{1.73_m2} (ref >59)
GFR, EST NON AFRICAN AMERICAN: 64 mL/min/{1.73_m2} (ref >59)
GLOBULIN, TOTAL: 3 g/dL (ref 1.5–4.5)
Glucose: 138 mg/dL — ABNORMAL HIGH (ref 65–99)
POTASSIUM: 5.1 mmol/L (ref 3.5–5.2)
SODIUM: 142 mmol/L (ref 134–144)
TOTAL PROTEIN: 7.8 g/dL (ref 6.0–8.5)

## 2016-12-31 LAB — IRON AND TIBC
IRON: 54 ug/dL (ref 27–159)
Iron Saturation: 14 % — ABNORMAL LOW (ref 15–55)
Total Iron Binding Capacity: 396 ug/dL (ref 250–450)
UIBC: 342 ug/dL (ref 131–425)

## 2016-12-31 LAB — HEMOGLOBIN A1C
Est. average glucose Bld gHb Est-mCnc: 140 mg/dL
Hgb A1c MFr Bld: 6.5 % — ABNORMAL HIGH (ref 4.8–5.6)

## 2016-12-31 LAB — HIV ANTIBODY (ROUTINE TESTING W REFLEX): HIV SCREEN 4TH GENERATION: NONREACTIVE

## 2017-01-02 IMAGING — CR DG CHEST 2V
2 series · 2 of 2 positions shown · non-contrast
Comparison: None in PACs

CLINICAL DATA: Preoperative exam prior to right total knee joint
replacement. History of hypertension, diabetes, obesity.

EXAM:
CHEST  2 VIEW

[w chest pa]
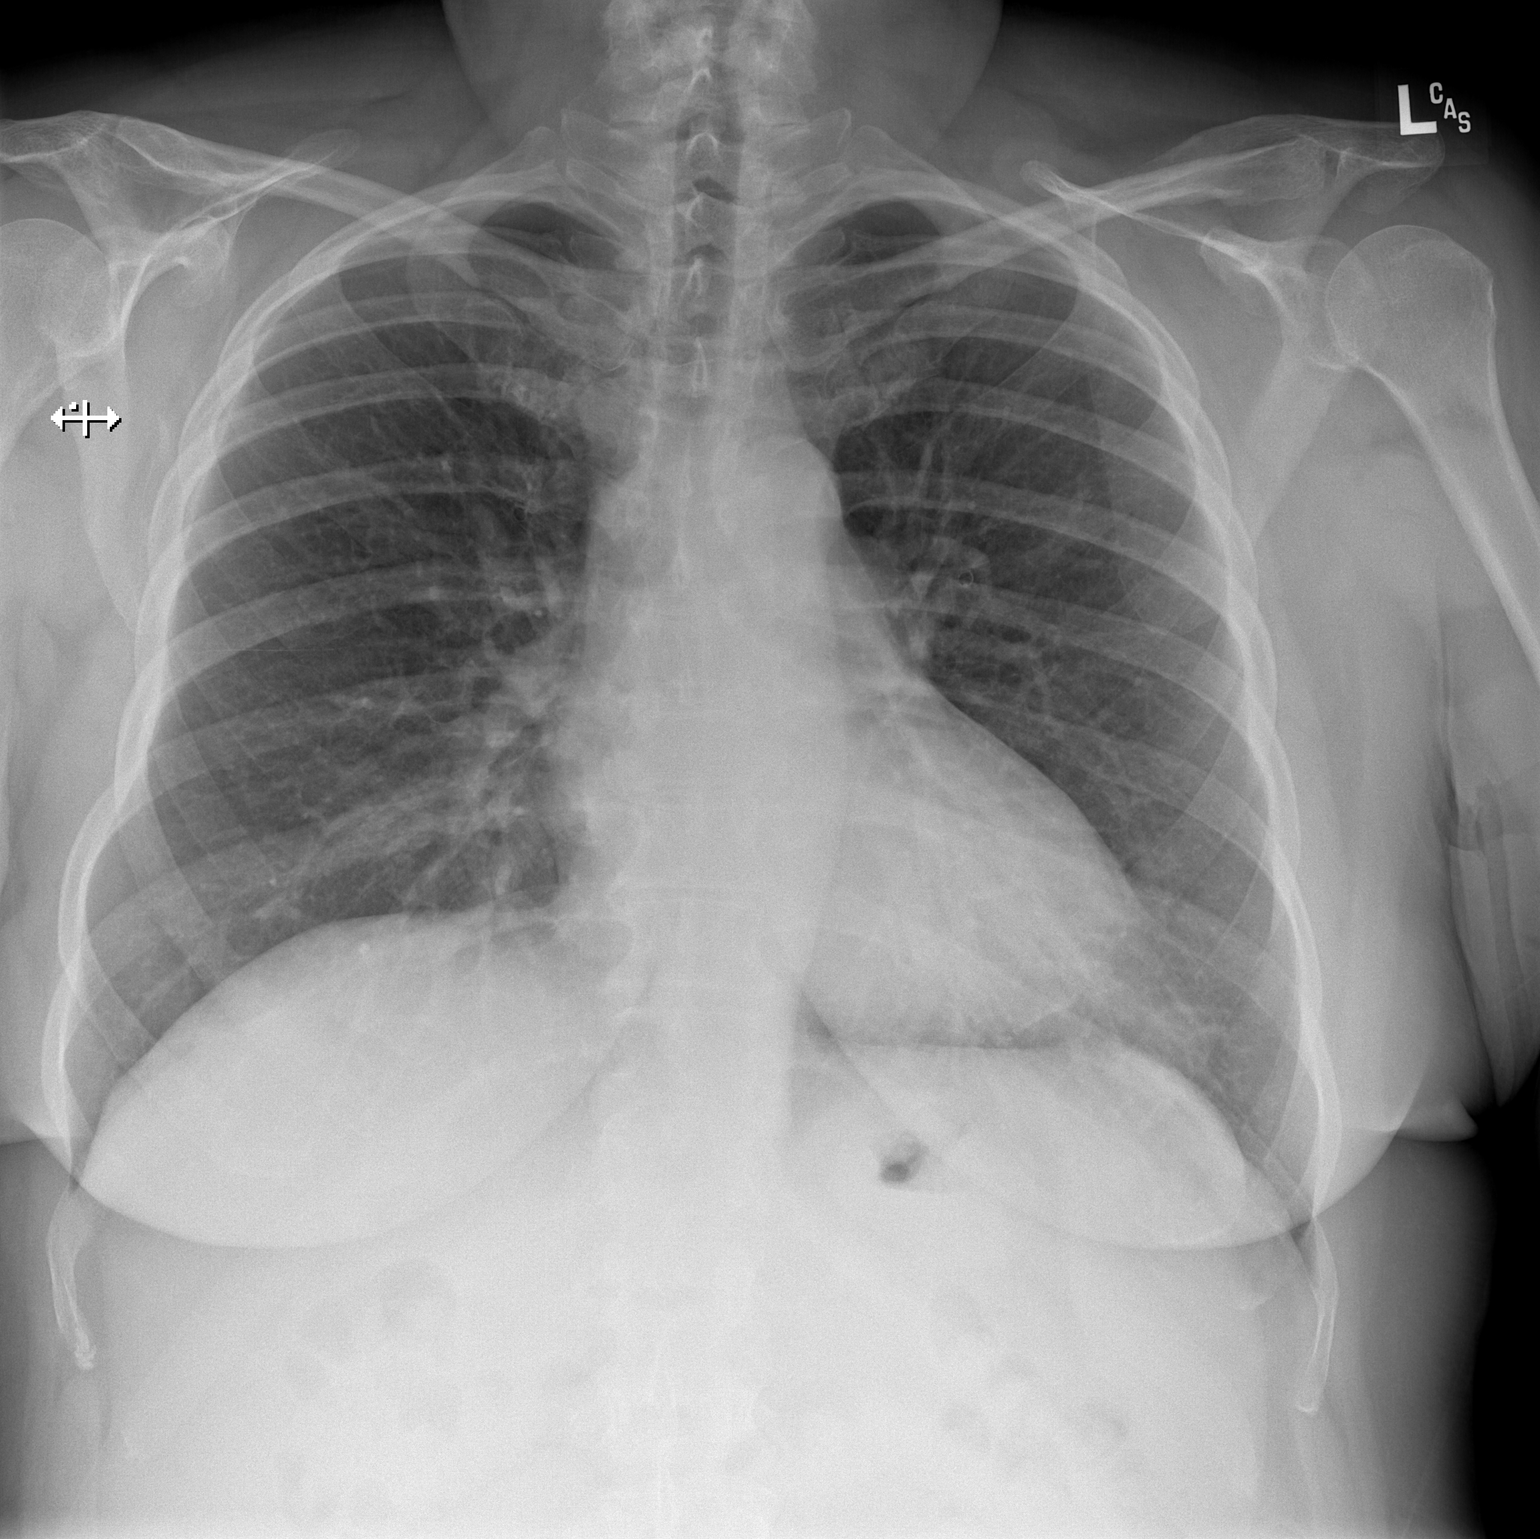

[w chest lat]
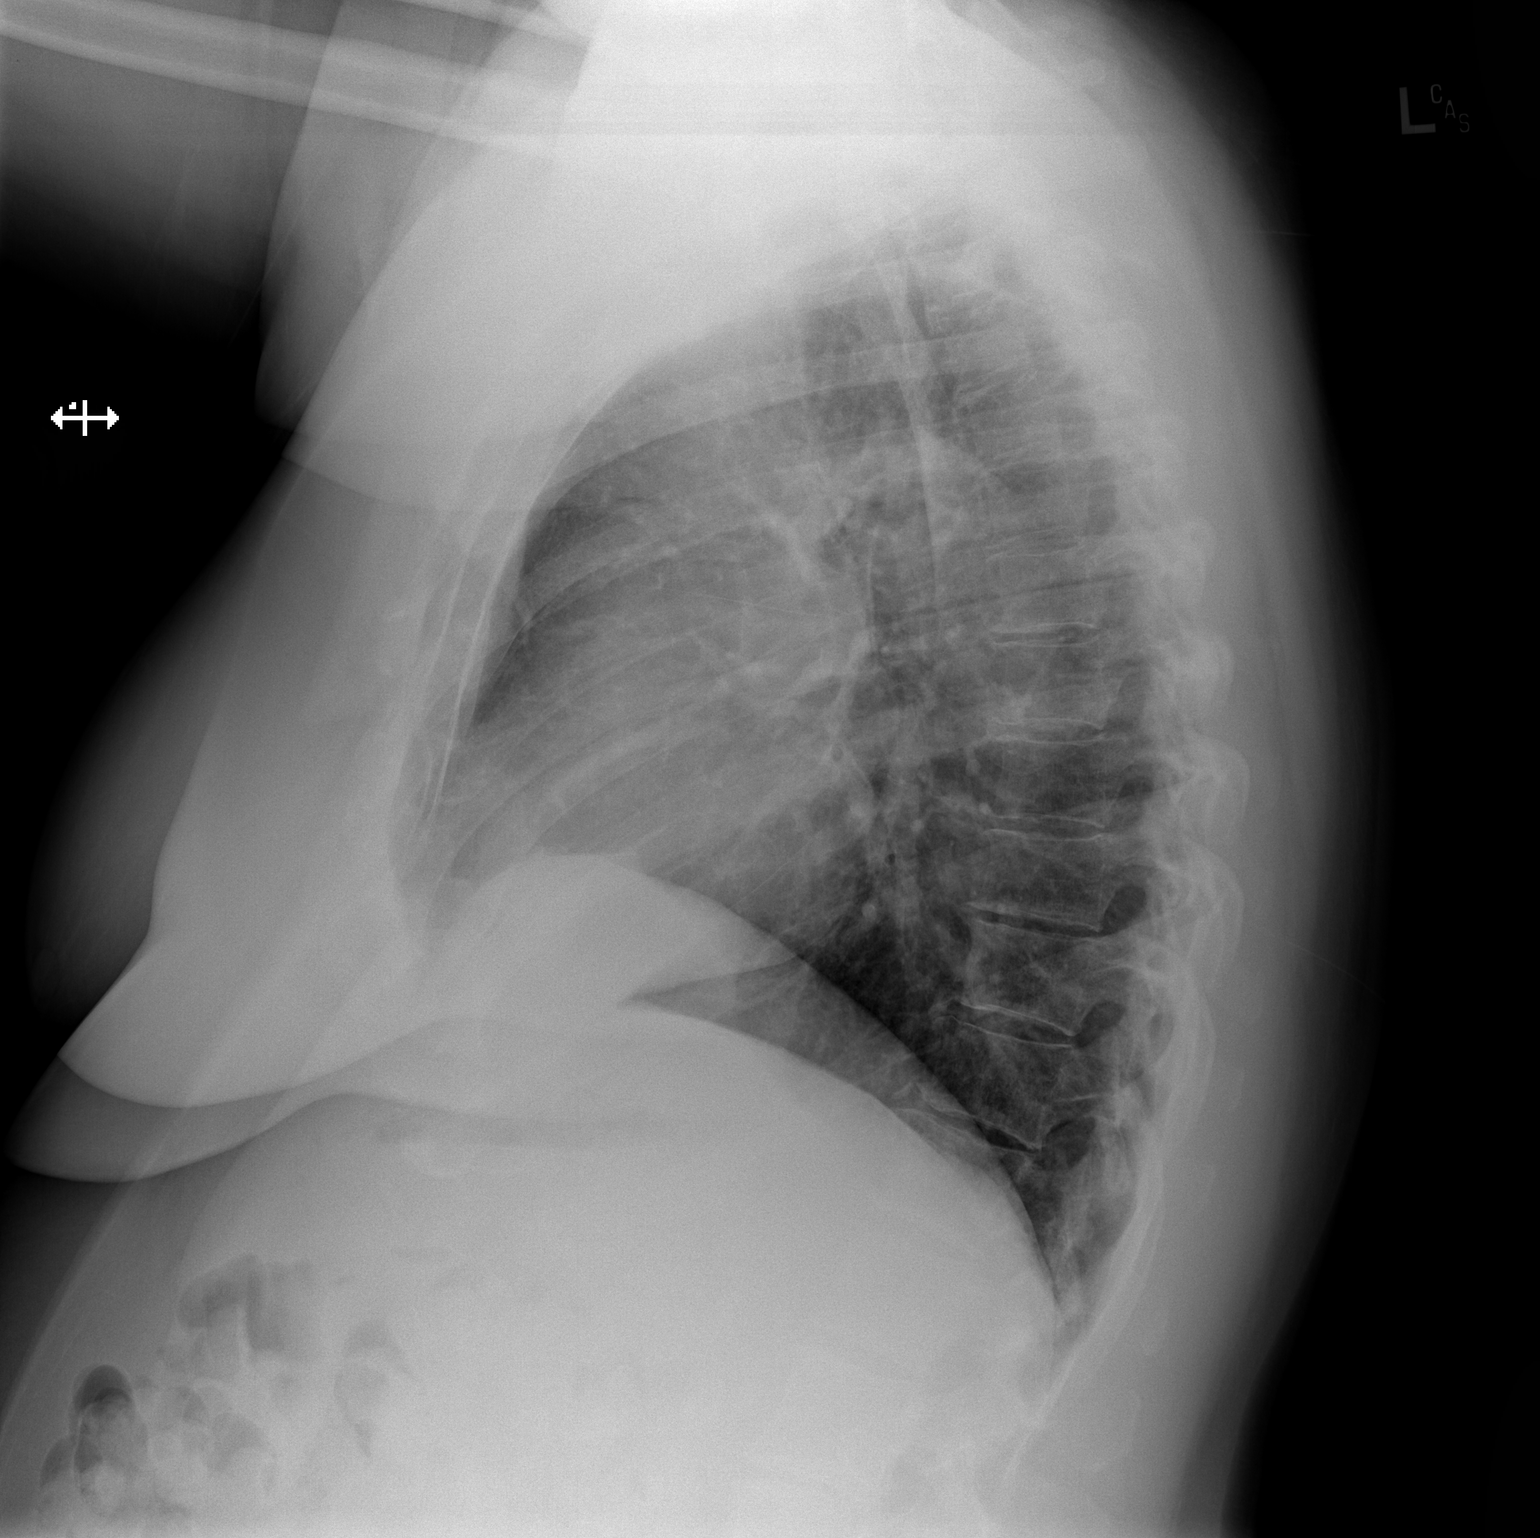

[2 of 2 positions shown; findings below may reference images not displayed]

FINDINGS: The lungs are adequately inflated. There is no focal infiltrate.
There is no pleural effusion. The heart and pulmonary vascularity
are normal. The mediastinum is normal in width. There is no pleural
effusion. The observed bony thorax is unremarkable.
IMPRESSION: There is no active cardiopulmonary disease.

## 2017-01-11 MED ORDER — PANTOPRAZOLE SODIUM 40 MG PO TBEC: 40.0000 mg | DELAYED_RELEASE_TABLET | Freq: Every day | ORAL | 3 refills | Status: DC

## 2017-01-11 MED ORDER — AMLODIPINE BESYLATE 5 MG PO TABS: 5.0000 mg | ORAL_TABLET | Freq: Every day | ORAL | 1 refills | Status: DC

## 2017-01-11 MED ORDER — BENAZEPRIL HCL 10 MG PO TABS: 10.0000 mg | ORAL_TABLET | Freq: Every day | ORAL | 1 refills | Status: DC

## 2017-01-12 MED FILL — PANTOPRAZOLE SOD DR 40 MG T: 40 | 90 days supply | Qty: 90 | Fill #0

## 2017-03-04 ENCOUNTER — Other Ambulatory Visit: Payer: Self-pay | Admitting: Internal Medicine

## 2017-03-04 MED FILL — BENAZEPRIL HCL 10 MG TABLET: 10 | 90 days supply | Qty: 90 | Fill #0

## 2017-03-04 MED FILL — AMLODIPINE BESYLATE 5 MG TA: 5 | 90 days supply | Qty: 90 | Fill #0

## 2017-03-18 ENCOUNTER — Encounter: Payer: Self-pay | Admitting: Gynecology

## 2017-05-11 MED FILL — PANTOPRAZOLE SOD DR 40 MG T: 40 | 90 days supply | Qty: 90 | Fill #1

## 2017-06-03 MED FILL — AMLODIPINE BESYLATE 5 MG TA: 5 | 90 days supply | Qty: 90 | Fill #1

## 2017-06-03 MED FILL — BENAZEPRIL HCL 10 MG TABLET: 10 | 90 days supply | Qty: 90 | Fill #1

## 2017-06-24 ENCOUNTER — Telehealth: Payer: Self-pay | Admitting: Family Medicine

## 2017-06-24 DIAGNOSIS — E119 Type 2 diabetes mellitus without complications: Secondary | ICD-10-CM

## 2017-06-24 DIAGNOSIS — D508 Other iron deficiency anemias: Secondary | ICD-10-CM

## 2017-06-24 DIAGNOSIS — R945 Abnormal results of liver function studies: Secondary | ICD-10-CM

## 2017-06-24 DIAGNOSIS — R7989 Other specified abnormal findings of blood chemistry: Secondary | ICD-10-CM

## 2017-06-24 NOTE — Telephone Encounter (Signed)
PATIENT STATES SHE SAW DR. Tamala Julian FOR THE FIRST TIME IN FEB. 2018 TO HAVE HER ANNUAL PHYSICAL DONE. DR. Tamala Julian WANTED HER TO RETURN IN 6 MONTHS TO FOLLOW-UP ON HER LABS. PATIENT WOULD LIKE TO COME IN A FEW DAYS EARLY TO HAVE HER BLOOD DRAWN SO THAT DR. Tamala Julian WILL HAVE THE RESULTS AT HER VISIT ON 06/30/17. SHE SAID HER OLD DOCTOR USE TO DO IT THAT WAY. SHE SAID IT TOOK A LITTLE WHILE TO SEE THE RESULTS ON MY-CHART SO SHE WOULD LIKE DR. Tamala Julian TO ALREADY HAVE THE RESULTS BACK. BEST PHONE [phone] (CELL) [place]

## 2017-06-26 ENCOUNTER — Ambulatory Visit: Payer: 59 | Admitting: Urgent Care

## 2017-06-26 DIAGNOSIS — E119 Type 2 diabetes mellitus without complications: Secondary | ICD-10-CM

## 2017-06-26 DIAGNOSIS — R945 Abnormal results of liver function studies: Secondary | ICD-10-CM

## 2017-06-26 DIAGNOSIS — R7989 Other specified abnormal findings of blood chemistry: Secondary | ICD-10-CM

## 2017-06-26 DIAGNOSIS — D508 Other iron deficiency anemias: Secondary | ICD-10-CM | POA: Diagnosis not present

## 2017-06-27 LAB — MICROALBUMIN, URINE: Microalbumin, Urine: <3 ug/mL

## 2017-06-28 LAB — CBC WITH DIFFERENTIAL/PLATELET
BASOS: 1 %
Basophils Absolute: 0 10*3/uL (ref 0.0–0.2)
EOS (ABSOLUTE): 0.3 10*3/uL (ref 0.0–0.4)
EOS: 4 %
HEMATOCRIT: 44.3 % (ref 34.0–46.6)
Hemoglobin: 15.2 g/dL (ref 11.1–15.9)
IMMATURE GRANS (ABS): 0 10*3/uL (ref 0.0–0.1)
Immature Granulocytes: 0 %
LYMPHS: 29 %
Lymphocytes Absolute: 1.7 10*3/uL (ref 0.7–3.1)
MCH: 30.3 pg (ref 26.6–33.0)
MCHC: 34.3 g/dL (ref 31.5–35.7)
MCV: 88 fL (ref 79–97)
Monocytes Absolute: 0.6 10*3/uL (ref 0.1–0.9)
Monocytes: 9 %
Neutrophils Absolute: 3.4 10*3/uL (ref 1.4–7.0)
Neutrophils: 57 %
PLATELETS: 642 10*3/uL — AB (ref 150–379)
RBC: 5.01 x10E6/uL (ref 3.77–5.28)
RDW: 14.2 % (ref 12.3–15.4)
WBC: 6 10*3/uL (ref 3.4–10.8)

## 2017-06-28 LAB — LIPID PANEL
CHOL/HDL RATIO: 3.2 ratio (ref 0.0–4.4)
Cholesterol, Total: 169 mg/dL (ref 100–199)
HDL: 53 mg/dL (ref >39)
LDL Calculated: 92 mg/dL (ref 0–99)
TRIGLYCERIDES: 119 mg/dL (ref 0–149)
VLDL Cholesterol Cal: 24 mg/dL (ref 5–40)

## 2017-06-28 LAB — COMPREHENSIVE METABOLIC PANEL
A/G RATIO: 1.7 (ref 1.2–2.2)
ALT: 20 IU/L (ref 0–32)
AST: 18 IU/L (ref 0–40)
Albumin: 4.8 g/dL (ref 3.5–5.5)
Alkaline Phosphatase: 78 IU/L (ref 39–117)
BUN/Creatinine Ratio: 20 (ref 9–23)
BUN: 15 mg/dL (ref 6–24)
Bilirubin Total: 0.4 mg/dL (ref 0.0–1.2)
CALCIUM: 10.3 mg/dL — AB (ref 8.7–10.2)
CO2: 25 mmol/L (ref 20–29)
CREATININE: 0.75 mg/dL (ref 0.57–1.00)
Chloride: 100 mmol/L (ref 96–106)
GFR calc non Af Amer: 91 mL/min/{1.73_m2} (ref >59)
GFR, EST AFRICAN AMERICAN: 105 mL/min/{1.73_m2} (ref >59)
GLOBULIN, TOTAL: 2.9 g/dL (ref 1.5–4.5)
Glucose: 104 mg/dL — ABNORMAL HIGH (ref 65–99)
POTASSIUM: 5.5 mmol/L — AB (ref 3.5–5.2)
Sodium: 141 mmol/L (ref 134–144)
TOTAL PROTEIN: 7.7 g/dL (ref 6.0–8.5)

## 2017-06-28 LAB — HEMOGLOBIN A1C
ESTIMATED AVERAGE GLUCOSE: 123 mg/dL
Hgb A1c MFr Bld: 5.9 % — ABNORMAL HIGH (ref 4.8–5.6)

## 2017-06-28 LAB — ACUTE HEP PANEL AND HEP B SURFACE AB
HEP A IGM: NEGATIVE
HEP B C IGM: NEGATIVE
Hep C Virus Ab: 0.1 s/co ratio (ref 0.0–0.9)
Hepatitis B Surface Ag: NEGATIVE

## 2017-06-29 NOTE — Progress Notes (Signed)
Subjective:    Patient ID: [name], female    DOB: 1966-04-26, 51 y.o.   MRN: [id number]  06/30/2017  Hypertension (6 month follow-up)   HPI This 51 y.o. female presents for six month follow-up of hypertension, hypercholesterolemia, GERD, glucose intolerance, and obesity.  Management changes made at last visit inlcuded:-  -Iron levels are low yet you are not anemic; your hemoglobin is normal at 15.0. I recommend starting an iron supplement (ferrous sulfate 325mg  over the counter) one tablet daily for the next three months. -Sugar/glucose is elevated at 138. Hemoglobin A1c is elevated at 6.5 which is consistent with very mild DIABETES. I highly recommend weight loss, exercise, and low-carbohydrate low-sugar food choices.  -Calcium is slightly elevated at 10.5. I recommend repeating at your next visit in six months.  -ALT is a liver function test; your level remains slightly elevated at 50. I recommend avoiding Tylenol containing products; I also recommend limiting alcohol intake. We will repeat at your follow-up visit as well. We commonly see fat deposition in the liver when we are carrying around extra weight; thus, weight loss should help lower your liver function tests.  -No evidence of HIV. -Urine shows protein present; I recommend repeating at your next visit.  Has lost 25 pounds in six months.   Daughter living with patient; now living alone with food planning.   Went back on Nutrisystem again.  Really wanted to hit 30 pounds in six months.  Weekends are really challenging.  Has decreased alcohol intake.  No more chips unless son in town.  Feeling better.  Desserts are fruit.  Taking one calcium tablet daily since last visit.  Snoring is better.  Feeling better. Has BP cuff at home; not checking it.  Running 130s/80s.   Tried to switch to H2 blocker, had recurrent GERD.    Having recurrent burning sensation substernal.  Tomatoes.   Iron tablet one daily.     BP Readings  from Last 3 Encounters:  06/30/17 138/81  10/09/16 (!) 140/92  04/11/16 (!) 157/91   Wt Readings from Last 3 Encounters:  06/30/17 217 lb (98.4 kg)  10/09/16 242 lb (109.8 kg)  04/11/16 241 lb (109.3 kg)   Immunization History  Administered Date(s) Administered  . Influenza Split 09/17/2011  . Influenza Whole 09/18/2008, 09/17/2009  . Influenza, Seasonal, Injecte, Preservative Fre 10/04/2012  . Influenza,inj,Quad PF,6+ Mos 09/07/2014, 09/10/2015, 10/09/2016  . Pneumococcal Polysaccharide-23 03/19/2009  . Td 09/13/2007    Review of Systems  Constitutional: Negative for chills, diaphoresis, fatigue and fever.  Eyes: Negative for visual disturbance.  Respiratory: Negative for cough and shortness of breath.   Cardiovascular: Negative for chest pain, palpitations and leg swelling.  Gastrointestinal: Negative for abdominal pain, constipation, diarrhea, nausea and vomiting.  Endocrine: Negative for cold intolerance, heat intolerance, polydipsia, polyphagia and polyuria.  Neurological: Negative for dizziness, tremors, seizures, syncope, facial asymmetry, speech difficulty, weakness, light-headedness, numbness and headaches.    Past Medical History:  Diagnosis Date  . Anemia   . Arthritis   . Diverticulosis of colon (without mention of hemorrhage)   . FIBROIDS, UTERUS   . GERD   . HYPERTENSION   . NSVD (normal spontaneous vaginal delivery)    X3  . Obesity, unspecified   . Other abnormal glucose   . THROMBOCYTHEMIA    Past Surgical History:  Procedure Laterality Date  . COLONOSCOPY    . DIAGNOSTIC LAPAROSCOPY    . HYSTEROSCOPY  06/19/2005   ASPIRATION OF LEFT PARATUBAL  CYST  . KNEE ARTHROSCOPY Right 2006   Dr Ronnie Derby  . LAPAROSCOPIC BILATERAL SALPINGO OOPHERECTOMY Bilateral 03/04/2016   Procedure: LAPAROSCOPIC BILATERAL SALPINGO OOPHORECTOMY with pelvic washings;  Surgeon: Terrance Mass, MD;  Location: [facility];  Service: Gynecology;  Laterality: Bilateral;  . LYSIS OF  ADHESION N/A 03/04/2016   Procedure: LYSIS OF ADHESION;  Surgeon: Terrance Mass, MD;  Location: [facility];  Service: Gynecology;  Laterality: N/A;  . RESECTOSCOPIC POLYPECTOMY    . TOTAL KNEE ARTHROPLASTY Right 10/10/2015   Procedure: TOTAL RIGHT KNEE ARTHROPLASTY;  Surgeon: Frederik Pear, MD;  Location: [facility];  Service: Orthopedics;  Laterality: Right;  . TUBAL LIGATION  2006   FILSHIE CLIP TECHNIQUE  . UTERINE FIBROID SURGERY     No Known Allergies Current Outpatient Prescriptions  Medication Sig Dispense Refill  . amLODipine (NORVASC) 5 MG tablet Take 1 tablet (5 mg total) by mouth daily. 90 tablet 1  . aspirin 81 MG tablet Take 81 mg by mouth at bedtime.     . benazepril (LOTENSIN) 10 MG tablet Take 1 tablet (10 mg total) by mouth daily. 90 tablet 1  . calcium-vitamin D (OSCAL WITH D) 500-200 MG-UNIT tablet Take 1 tablet by mouth at bedtime.    . ferrous sulfate 325 (65 FE) MG EC tablet Take 325 mg by mouth daily with breakfast.    . loratadine (ALLERGY) 10 MG tablet Take 10 mg by mouth at bedtime.     Marland Kitchen nystatin-triamcinolone (MYCOLOG II) cream Apply topically 2 (two) times daily. 30 g 2  . pantoprazole (PROTONIX) 20 MG tablet Take 1 tablet (20 mg total) by mouth daily. 90 tablet 1   No current facility-administered medications for this visit.    Social History   Social History  . Marital status: Widowed    Spouse name: N/A  . Number of children: 3  . Years of education: N/A   Occupational History  . homemaker    Social History Main Topics  . Smoking status: Never Smoker  . Smokeless tobacco: Never Used  . Alcohol use 0.0 oz/week     Comment: occassionally  . Drug use: No  . Sexual activity: Yes     Comment: TUBAL LIGATION   Other Topics Concern  . Not on file   Social History Narrative   Husband is Podiatrist   Family History  Problem Relation Age of Onset  . Breast cancer Mother   . Diabetes Father   . Hypertension Father   . Heart disease Father   . Diabetes  Maternal Grandmother   . Colon cancer Neg Hx   . Stomach cancer Neg Hx   . Rectal cancer Neg Hx        Objective:    BP 138/81   Pulse 87   Temp 98 F (36.7 C) (Oral)   Resp 16   Ht 5' 5.75" (1.67 m)   Wt 217 lb (98.4 kg)   SpO2 97%   BMI 35.29 kg/m  Physical Exam  Constitutional: She is oriented to person, place, and time. She appears well-developed and well-nourished. No distress.  HENT:  Head: Normocephalic and atraumatic.  Right Ear: External ear normal.  Left Ear: External ear normal.  Nose: Nose normal.  Mouth/Throat: Oropharynx is clear and moist.  Eyes: Pupils are equal, round, and reactive to light. Conjunctivae and EOM are normal.  Neck: Normal range of motion. Neck supple. Carotid bruit is not present. No thyromegaly present.  Cardiovascular: Normal rate, regular rhythm, normal  heart sounds and intact distal pulses.  Exam reveals no gallop and no friction rub.   No murmur heard. Pulmonary/Chest: Effort normal and breath sounds normal. She has no wheezes. She has no rales.  Abdominal: Soft. Bowel sounds are normal. She exhibits no distension and no mass. There is no tenderness. There is no rebound and no guarding.  Lymphadenopathy:    She has no cervical adenopathy.  Neurological: She is alert and oriented to person, place, and time. No cranial nerve deficit.  Skin: Skin is warm and dry. No rash noted. She is not diaphoretic. No erythema. No pallor.  Psychiatric: She has a normal mood and affect. Her behavior is normal.   Results for orders placed or performed in visit on 06/26/17  CBC with Differential/Platelet  Result Value Ref Range   WBC 6.0 3.4 - 10.8 x10E3/uL   RBC 5.01 3.77 - 5.28 x10E6/uL   Hemoglobin 15.2 11.1 - 15.9 g/dL   Hematocrit 44.3 34.0 - 46.6 %   MCV 88 79 - 97 fL   MCH 30.3 26.6 - 33.0 pg   MCHC 34.3 31.5 - 35.7 g/dL   RDW 14.2 12.3 - 15.4 %   Platelets 642 (H) 150 - 379 x10E3/uL   Neutrophils 57 Not Estab. %   Lymphs 29 Not Estab. %    Monocytes 9 Not Estab. %   Eos 4 Not Estab. %   Basos 1 Not Estab. %   Neutrophils Absolute 3.4 1.4 - 7.0 x10E3/uL   Lymphocytes Absolute 1.7 0.7 - 3.1 x10E3/uL   Monocytes Absolute 0.6 0.1 - 0.9 x10E3/uL   EOS (ABSOLUTE) 0.3 0.0 - 0.4 x10E3/uL   Basophils Absolute 0.0 0.0 - 0.2 x10E3/uL   Immature Granulocytes 0 Not Estab. %   Immature Grans (Abs) 0.0 0.0 - 0.1 x10E3/uL  Comprehensive metabolic panel  Result Value Ref Range   Glucose 104 (H) 65 - 99 mg/dL   BUN 15 6 - 24 mg/dL   Creatinine, Ser 0.75 0.57 - 1.00 mg/dL   GFR calc non Af Amer 91 >59 mL/min/1.73   GFR calc Af Amer 105 >59 mL/min/1.73   BUN/Creatinine Ratio 20 9 - 23   Sodium 141 134 - 144 mmol/L   Potassium 5.5 (H) 3.5 - 5.2 mmol/L   Chloride 100 96 - 106 mmol/L   CO2 25 20 - 29 mmol/L   Calcium 10.3 (H) 8.7 - 10.2 mg/dL   Total Protein 7.7 6.0 - 8.5 g/dL   Albumin 4.8 3.5 - 5.5 g/dL   Globulin, Total 2.9 1.5 - 4.5 g/dL   Albumin/Globulin Ratio 1.7 1.2 - 2.2   Bilirubin Total 0.4 0.0 - 1.2 mg/dL   Alkaline Phosphatase 78 39 - 117 IU/L   AST 18 0 - 40 IU/L   ALT 20 0 - 32 IU/L  Hemoglobin A1c  Result Value Ref Range   Hgb A1c MFr Bld 5.9 (H) 4.8 - 5.6 %   Est. average glucose Bld gHb Est-mCnc 123 mg/dL  Lipid panel  Result Value Ref Range   Cholesterol, Total 169 100 - 199 mg/dL   Triglycerides 119 0 - 149 mg/dL   HDL 53 >39 mg/dL   VLDL Cholesterol Cal 24 5 - 40 mg/dL   LDL Calculated 92 0 - 99 mg/dL   Chol/HDL Ratio 3.2 0.0 - 4.4 ratio  Microalbumin, urine  Result Value Ref Range   Albumin, Urine <3.0 Not Estab. ug/mL  Acute Hep Panel & Hep B Surface Ab  Result Value Ref  Range   Hep A IgM Negative Negative   Hepatitis B Surface Ag Negative Negative   Hep B C IgM Negative Negative   Hepatitis B Surf Ab Quant <3.1 (L) Immunity>9.9 mIU/mL   Hep C Virus Ab 0.1 0.0 - 0.9 s/co ratio   No results found. Depression screen [facility] 2/9 06/30/2017 12/30/2016 12/15/2014 12/14/2013  Decreased Interest 0 0 0 0  Down,  Depressed, Hopeless 0 0 0 1  PHQ - 2 Score 0 0 0 1   Fall Risk  06/30/2017 12/30/2016 12/15/2014 12/14/2013  Falls in the past year? No No No No        Assessment & Plan:   1. Essential hypertension   2. Gastroesophageal reflux disease without esophagitis   3. Impaired glucose tolerance   4. Iron deficiency anemia secondary to inadequate dietary iron intake   5. Primary osteoarthritis of right knee   6. THROMBOCYTHEMIA   7. Hypercalcemia   8. Need for immunization against influenza    -improvement with glucose intolerance with weight loss.   -congratulations on weight loss.  -continue current medications for now.  -labs reviewed in detail during visit.  -LFTs now improved/normal; acute hepatitis panel negative. -calcium remains elevated; recommend stopping Vitamin D supplementation.  Repeat at next visit due to minimal elevation at this time.   Orders Placed This Encounter  Procedures  . Flu Vaccine QUAD 36+ mos IM   Meds ordered this encounter  Medications  . ferrous sulfate 325 (65 FE) MG EC tablet    Sig: Take 325 mg by mouth daily with breakfast.  . pantoprazole (PROTONIX) 20 MG tablet    Sig: Take 1 tablet (20 mg total) by mouth daily.    Dispense:  90 tablet    Refill:  1  . amLODipine (NORVASC) 5 MG tablet    Sig: Take 1 tablet (5 mg total) by mouth daily.    Dispense:  90 tablet    Refill:  1  . benazepril (LOTENSIN) 10 MG tablet    Sig: Take 1 tablet (10 mg total) by mouth daily.    Dispense:  90 tablet    Refill:  1    No Follow-up on file.   Serrina Minogue Elayne Guerin, M.D. Primary Care at [facility] [address] [place], [place]  [address] [phone] phone [phone] fax

## 2017-06-30 ENCOUNTER — Encounter: Payer: Self-pay | Admitting: Family Medicine

## 2017-06-30 ENCOUNTER — Ambulatory Visit (INDEPENDENT_AMBULATORY_CARE_PROVIDER_SITE_OTHER): Payer: 59 | Admitting: Family Medicine

## 2017-06-30 VITALS — BP 138/81 | HR 87 | Temp 98.0°F | Resp 16 | Ht 65.75 in | Wt 217.0 lb

## 2017-06-30 DIAGNOSIS — R7302 Impaired glucose tolerance (oral): Secondary | ICD-10-CM | POA: Diagnosis not present

## 2017-06-30 DIAGNOSIS — Z23 Encounter for immunization: Secondary | ICD-10-CM

## 2017-06-30 DIAGNOSIS — K219 Gastro-esophageal reflux disease without esophagitis: Secondary | ICD-10-CM | POA: Diagnosis not present

## 2017-06-30 DIAGNOSIS — M1711 Unilateral primary osteoarthritis, right knee: Secondary | ICD-10-CM | POA: Diagnosis not present

## 2017-06-30 DIAGNOSIS — I10 Essential (primary) hypertension: Secondary | ICD-10-CM | POA: Diagnosis not present

## 2017-06-30 DIAGNOSIS — D473 Essential (hemorrhagic) thrombocythemia: Secondary | ICD-10-CM

## 2017-06-30 DIAGNOSIS — D508 Other iron deficiency anemias: Secondary | ICD-10-CM

## 2017-06-30 MED ORDER — BENAZEPRIL HCL 10 MG PO TABS: 10.0000 mg | ORAL_TABLET | Freq: Every day | ORAL | 1 refills | Status: DC

## 2017-06-30 MED ORDER — PANTOPRAZOLE SODIUM 20 MG PO TBEC: 20.0000 mg | DELAYED_RELEASE_TABLET | Freq: Every day | ORAL | 1 refills | Status: DC

## 2017-06-30 MED ORDER — AMLODIPINE BESYLATE 5 MG PO TABS: 5.0000 mg | ORAL_TABLET | Freq: Every day | ORAL | 1 refills | Status: DC

## 2017-06-30 MED FILL — PANTOPRAZOLE SOD DR 20 MG T: 20 | 90 days supply | Qty: 90 | Fill #0

## 2017-06-30 NOTE — Patient Instructions (Signed)
     IF you received an x-ray today, you will receive an invoice from [facility]. Please contact [facility] at [phone] with questions or concerns regarding your invoice.   IF you received labwork today, you will receive an invoice from [facility]. Please contact [facility] at [phone] with questions or concerns regarding your invoice.   Our billing staff will not be able to assist you with questions regarding bills from these companies.  You will be contacted with the lab results as soon as they are available. The fastest way to get your results is to activate your My Chart account. Instructions are located on the last page of this paperwork. If you have not heard from us regarding the results in 2 weeks, please contact this office.     

## 2017-08-07 DIAGNOSIS — L821 Other seborrheic keratosis: Secondary | ICD-10-CM | POA: Diagnosis not present

## 2017-08-07 DIAGNOSIS — D2262 Melanocytic nevi of left upper limb, including shoulder: Secondary | ICD-10-CM | POA: Diagnosis not present

## 2017-08-07 DIAGNOSIS — D2271 Melanocytic nevi of right lower limb, including hip: Secondary | ICD-10-CM | POA: Diagnosis not present

## 2017-08-07 DIAGNOSIS — L814 Other melanin hyperpigmentation: Secondary | ICD-10-CM | POA: Diagnosis not present

## 2017-08-07 DIAGNOSIS — D1801 Hemangioma of skin and subcutaneous tissue: Secondary | ICD-10-CM | POA: Diagnosis not present

## 2017-08-07 DIAGNOSIS — D225 Melanocytic nevi of trunk: Secondary | ICD-10-CM | POA: Diagnosis not present

## 2017-08-07 DIAGNOSIS — D485 Neoplasm of uncertain behavior of skin: Secondary | ICD-10-CM | POA: Diagnosis not present

## 2017-08-07 DIAGNOSIS — L820 Inflamed seborrheic keratosis: Secondary | ICD-10-CM | POA: Diagnosis not present

## 2017-08-07 DIAGNOSIS — D2261 Melanocytic nevi of right upper limb, including shoulder: Secondary | ICD-10-CM | POA: Diagnosis not present

## 2017-08-07 DIAGNOSIS — D2272 Melanocytic nevi of left lower limb, including hip: Secondary | ICD-10-CM | POA: Diagnosis not present

## 2017-09-04 MED FILL — AMLODIPINE BESYLATE 5 MG TA: 5 | 90 days supply | Qty: 90 | Fill #0

## 2017-09-04 MED FILL — BENAZEPRIL HCL 10 MG TABLET: 10 | 90 days supply | Qty: 90 | Fill #0

## 2017-10-05 DIAGNOSIS — Z803 Family history of malignant neoplasm of breast: Secondary | ICD-10-CM | POA: Diagnosis not present

## 2017-10-05 DIAGNOSIS — Z1231 Encounter for screening mammogram for malignant neoplasm of breast: Secondary | ICD-10-CM | POA: Diagnosis not present

## 2017-10-12 ENCOUNTER — Encounter: Payer: 59 | Admitting: Obstetrics & Gynecology

## 2017-10-12 ENCOUNTER — Encounter: Payer: 59 | Admitting: Gynecology

## 2017-10-14 ENCOUNTER — Ambulatory Visit (INDEPENDENT_AMBULATORY_CARE_PROVIDER_SITE_OTHER): Payer: 59 | Admitting: Obstetrics & Gynecology

## 2017-10-14 ENCOUNTER — Encounter: Payer: Self-pay | Admitting: Obstetrics & Gynecology

## 2017-10-14 VITALS — BP 148/88 | Ht 65.75 in | Wt 224.0 lb

## 2017-10-14 DIAGNOSIS — Z13820 Encounter for screening for osteoporosis: Secondary | ICD-10-CM | POA: Diagnosis not present

## 2017-10-14 DIAGNOSIS — E8940 Asymptomatic postprocedural ovarian failure: Secondary | ICD-10-CM | POA: Diagnosis not present

## 2017-10-14 DIAGNOSIS — R300 Dysuria: Secondary | ICD-10-CM

## 2017-10-14 DIAGNOSIS — R87610 Atypical squamous cells of undetermined significance on cytologic smear of cervix (ASC-US): Secondary | ICD-10-CM | POA: Diagnosis not present

## 2017-10-14 DIAGNOSIS — Z01419 Encounter for gynecological examination (general) (routine) without abnormal findings: Secondary | ICD-10-CM | POA: Diagnosis not present

## 2017-10-14 DIAGNOSIS — Z90722 Acquired absence of ovaries, bilateral: Secondary | ICD-10-CM

## 2017-10-14 LAB — URINALYSIS, ROUTINE W REFLEX MICROSCOPIC
BILIRUBIN URINE: NEGATIVE
Bacteria, UA: NONE SEEN /HPF
Glucose, UA: NEGATIVE
HGB URINE DIPSTICK: NEGATIVE
Hyaline Cast: NONE SEEN /LPF
KETONES UR: NEGATIVE
Leukocytes, UA: NEGATIVE
NITRITE: NEGATIVE
RBC / HPF: NONE SEEN /HPF (ref 0–2)
Specific Gravity, Urine: 1.02 (ref 1.001–1.03)
WBC, UA: NONE SEEN /HPF (ref 0–5)
pH: 7 (ref 5.0–8.0)

## 2017-10-14 LAB — NOTE

## 2017-10-14 NOTE — Patient Instructions (Signed)
1. Encounter for routine gynecological examination with Papanicolaou smear of cervix Normal gynecologic exam.  Pap reflex done.  Breast exam normal.  Screening mammogram normal November 2018.  Colonoscopy normal 2014.  Health labs with family physician.  2. Dysuria Normal urine analysis.  Patient reassured. - Urinalysis, Routine w reflex microscopic  3. Surgical menopause Status post bilateral salpingo-oophorectomy in May 2017.  Menopause well-tolerated on no hormone replacement therapy.  Taking vitamin D supplements with calcium and doing regular weightbearing physical activity. - Vitamin D 1,25 dihydroxy  4. Status post bilateral salpingo-oophorectomy (BSO)  5. Screening for osteoporosis Surgical menopause.  Will follow up here for bone density. - DG Bone Density; Future  [name], it was a pleasure meeting you today!  I will inform you of your results as soon as available.   Health Maintenance for Postmenopausal Women Menopause is a normal process in which your reproductive ability comes to an end. This process happens gradually over a span of months to years, usually between the ages of 35 and 5. Menopause is complete when you have missed 12 consecutive menstrual periods. It is important to talk with your health care provider about some of the most common conditions that affect postmenopausal women, such as heart disease, cancer, and bone loss (osteoporosis). Adopting a healthy lifestyle and getting preventive care can help to promote your health and wellness. Those actions can also lower your chances of developing some of these common conditions. What should I know about menopause? During menopause, you may experience a number of symptoms, such as:  Moderate-to-severe hot flashes.  Night sweats.  Decrease in sex drive.  Mood swings.  Headaches.  Tiredness.  Irritability.  Memory problems.  Insomnia.  Choosing to treat or not to treat menopausal changes is an  individual decision that you make with your health care provider. What should I know about hormone replacement therapy and supplements? Hormone therapy products are effective for treating symptoms that are associated with menopause, such as hot flashes and night sweats. Hormone replacement carries certain risks, especially as you become older. If you are thinking about using estrogen or estrogen with progestin treatments, discuss the benefits and risks with your health care provider. What should I know about heart disease and stroke? Heart disease, heart attack, and stroke become more likely as you age. This may be due, in part, to the hormonal changes that your body experiences during menopause. These can affect how your body processes dietary fats, triglycerides, and cholesterol. Heart attack and stroke are both medical emergencies. There are many things that you can do to help prevent heart disease and stroke:  Have your blood pressure checked at least every 1-2 years. High blood pressure causes heart disease and increases the risk of stroke.  If you are 45-36 years old, ask your health care provider if you should take aspirin to prevent a heart attack or a stroke.  Do not use any tobacco products, including cigarettes, chewing tobacco, or electronic cigarettes. If you need help quitting, ask your health care provider.  It is important to eat a healthy diet and maintain a healthy weight. ? Be sure to include plenty of vegetables, fruits, low-fat dairy products, and lean protein. ? Avoid eating foods that are high in solid fats, added sugars, or salt (sodium).  Get regular exercise. This is one of the most important things that you can do for your health. ? Try to exercise for at least 150 minutes each week. The type of exercise that  you do should increase your heart rate and make you sweat. This is known as moderate-intensity exercise. ? Try to do strengthening exercises at least twice each  week. Do these in addition to the moderate-intensity exercise.  Know your numbers.Ask your health care provider to check your cholesterol and your blood glucose. Continue to have your blood tested as directed by your health care provider.  What should I know about cancer screening? There are several types of cancer. Take the following steps to reduce your risk and to catch any cancer development as early as possible. Breast Cancer  Practice breast self-awareness. ? This means understanding how your breasts normally appear and feel. ? It also means doing regular breast self-exams. Let your health care provider know about any changes, no matter how small.  If you are 27 or older, have a clinician do a breast exam (clinical breast exam or CBE) every year. Depending on your age, family history, and medical history, it may be recommended that you also have a yearly breast X-ray (mammogram).  If you have a family history of breast cancer, talk with your health care provider about genetic screening.  If you are at high risk for breast cancer, talk with your health care provider about having an MRI and a mammogram every year.  Breast cancer (BRCA) gene test is recommended for women who have family members with BRCA-related cancers. Results of the assessment will determine the need for genetic counseling and BRCA1 and for BRCA2 testing. BRCA-related cancers include these types: ? Breast. This occurs in males or females. ? Ovarian. ? Tubal. This may also be called fallopian tube cancer. ? Cancer of the abdominal or pelvic lining (peritoneal cancer). ? Prostate. ? Pancreatic.  Cervical, Uterine, and Ovarian Cancer Your health care provider may recommend that you be screened regularly for cancer of the pelvic organs. These include your ovaries, uterus, and vagina. This screening involves a pelvic exam, which includes checking for microscopic changes to the surface of your cervix (Pap test).  For  women ages 21-65, health care providers may recommend a pelvic exam and a Pap test every three years. For women ages 73-65, they may recommend the Pap test and pelvic exam, combined with testing for human papilloma virus (HPV), every five years. Some types of HPV increase your risk of cervical cancer. Testing for HPV may also be done on women of any age who have unclear Pap test results.  Other health care providers may not recommend any screening for nonpregnant women who are considered low risk for pelvic cancer and have no symptoms. Ask your health care provider if a screening pelvic exam is right for you.  If you have had past treatment for cervical cancer or a condition that could lead to cancer, you need Pap tests and screening for cancer for at least 20 years after your treatment. If Pap tests have been discontinued for you, your risk factors (such as having a new sexual partner) need to be reassessed to determine if you should start having screenings again. Some women have medical problems that increase the chance of getting cervical cancer. In these cases, your health care provider may recommend that you have screening and Pap tests more often.  If you have a family history of uterine cancer or ovarian cancer, talk with your health care provider about genetic screening.  If you have vaginal bleeding after reaching menopause, tell your health care provider.  There are currently no reliable tests available to screen  for ovarian cancer.  Lung Cancer Lung cancer screening is recommended for adults 10-28 years old who are at high risk for lung cancer because of a history of smoking. A yearly low-dose CT scan of the lungs is recommended if you:  Currently smoke.  Have a history of at least 30 pack-years of smoking and you currently smoke or have quit within the past 15 years. A pack-year is smoking an average of one pack of cigarettes per day for one year.  Yearly screening should:  Continue  until it has been 15 years since you quit.  Stop if you develop a health problem that would prevent you from having lung cancer treatment.  Colorectal Cancer  This type of cancer can be detected and can often be prevented.  Routine colorectal cancer screening usually begins at age 98 and continues through age 38.  If you have risk factors for colon cancer, your health care provider may recommend that you be screened at an earlier age.  If you have a family history of colorectal cancer, talk with your health care provider about genetic screening.  Your health care provider may also recommend using home test kits to check for hidden blood in your stool.  A small camera at the end of a tube can be used to examine your colon directly (sigmoidoscopy or colonoscopy). This is done to check for the earliest forms of colorectal cancer.  Direct examination of the colon should be repeated every 5-10 years until age 15. However, if early forms of precancerous polyps or small growths are found or if you have a family history or genetic risk for colorectal cancer, you may need to be screened more often.  Skin Cancer  Check your skin from head to toe regularly.  Monitor any moles. Be sure to tell your health care provider: ? About any new moles or changes in moles, especially if there is a change in a mole's shape or color. ? If you have a mole that is larger than the size of a pencil eraser.  If any of your family members has a history of skin cancer, especially at a young age, talk with your health care provider about genetic screening.  Always use sunscreen. Apply sunscreen liberally and repeatedly throughout the day.  Whenever you are outside, protect yourself by wearing long sleeves, pants, a wide-brimmed hat, and sunglasses.  What should I know about osteoporosis? Osteoporosis is a condition in which bone destruction happens more quickly than new bone creation. After menopause, you may be  at an increased risk for osteoporosis. To help prevent osteoporosis or the bone fractures that can happen because of osteoporosis, the following is recommended:  If you are 22-32 years old, get at least 1,000 mg of calcium and at least 600 mg of vitamin D per day.  If you are older than age 27 but younger than age 38, get at least 1,200 mg of calcium and at least 600 mg of vitamin D per day.  If you are older than age 83, get at least 1,200 mg of calcium and at least 800 mg of vitamin D per day.  Smoking and excessive alcohol intake increase the risk of osteoporosis. Eat foods that are rich in calcium and vitamin D, and do weight-bearing exercises several times each week as directed by your health care provider. What should I know about how menopause affects my mental health? Depression may occur at any age, but it is more common as you become  older. Common symptoms of depression include:  Low or sad mood.  Changes in sleep patterns.  Changes in appetite or eating patterns.  Feeling an overall lack of motivation or enjoyment of activities that you previously enjoyed.  Frequent crying spells.  Talk with your health care provider if you think that you are experiencing depression. What should I know about immunizations? It is important that you get and maintain your immunizations. These include:  Tetanus, diphtheria, and pertussis (Tdap) booster vaccine.  Influenza every year before the flu season begins.  Pneumonia vaccine.  Shingles vaccine.  Your health care provider may also recommend other immunizations. This information is not intended to replace advice given to you by your health care provider. Make sure you discuss any questions you have with your health care provider. Document Released: 12/12/2005 Document Revised: 05/09/2016 Document Reviewed: 07/24/2015 Elsevier Interactive Patient Education  2018 [employer].

## 2017-10-14 NOTE — Addendum Note (Signed)
Addended by: Alen Blew on: 10/14/2017 12:34 PM   Modules accepted: Orders

## 2017-10-14 NOTE — Progress Notes (Signed)
[name] [birth year] [id number]   History:    51 y.o.  G3P3L3  Widowed x 2 1/2 yrs, husband died of Esophageal Ca.  3 children all doing well.  Oldest daughter finishing in Podiatry (same specialty as her father).  2nd daughter in [place].  Son in Engineer, production.  RP:  Established patient presenting for annual gyn exam  HPI: Status post surgical menopause with laparoscopic bilateral salpingo-oophorectomy May 2017 for benign ovarian cysts.  Well on no hormone replacement therapy.  No postmenopausal bleeding.  Husband died of esophageal cancer 2-1/2 years ago.  Patient had difficult but normal grieving.  Well surrounded by family and friends.  No symptoms of major depression.  Abstinent.  Complains of mild discomfort with micturition.  Had vaginal irritation which is improving with going back to her usual soap.  Breasts normal.  Bowel movements normal.  Health labs with family physician.  Taking calcium with vitamin D supplements.  Regular physical activity.  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  Last 25 pounds in the last year.  Current body mass index at 36.43.  Past medical history,surgical history, family history and social history were all reviewed and documented in the EPIC chart.  Gynecologic History No LMP recorded. Patient is not currently having periods (Reason: Perimenopausal). Contraception: post menopausal status Last Pap: 09/2015. Results were: normal Last mammogram: 09/2017. Results were: normal [place] 2014, 10 yr schedule No previous Bone Density  Obstetric History OB History  Gravida Para Term Preterm AB Living  3 3 3     3   SAB TAB Ectopic Multiple Live Births          3    # Outcome Date GA Lbr Len/2nd Weight Sex Delivery Anes PTL Lv  3 Term     M Vag-Spont  N LIV  2 Term     F Vag-Spont  N LIV  1 Term     F Vag-Spont  N LIV       ROS: A ROS was performed and pertinent positives and negatives are included in the history.  GENERAL: No fevers or  chills. HEENT: No change in vision, no earache, sore throat or sinus congestion. NECK: No pain or stiffness. CARDIOVASCULAR: No chest pain or pressure. No palpitations. PULMONARY: No shortness of breath, cough or wheeze. GASTROINTESTINAL: No abdominal pain, nausea, vomiting or diarrhea, melena or bright red blood per rectum. GENITOURINARY: No urinary frequency, urgency, hesitancy or dysuria. MUSCULOSKELETAL: No joint or muscle pain, no back pain, no recent trauma. DERMATOLOGIC: No rash, no itching, no lesions. ENDOCRINE: No polyuria, polydipsia, no heat or cold intolerance. No recent change in weight. HEMATOLOGICAL: No anemia or easy bruising or bleeding. NEUROLOGIC: No headache, seizures, numbness, tingling or weakness. PSYCHIATRIC: No depression, no loss of interest in normal activity or change in sleep pattern.     Exam:   BP (!) 148/88 Comment: lg cuff  Ht 5' 5.75" (1.67 m)   Wt 224 lb (101.6 kg)   BMI 36.43 kg/m   Body mass index is 36.43 kg/m.  General appearance : Well developed well nourished female. No acute distress HEENT: Eyes: no retinal hemorrhage or exudates,  Neck supple, trachea midline, no carotid bruits, no thyroidmegaly Lungs: Clear to auscultation, no rhonchi or wheezes, or rib retractions  Heart: Regular rate and rhythm, no murmurs or gallops Breast:Examined in sitting and supine position were symmetrical in appearance, no palpable masses or tenderness,  no skin retraction, no nipple inversion, no nipple discharge, no  skin discoloration, no axillary or supraclavicular lymphadenopathy Abdomen: no palpable masses or tenderness, no rebound or guarding Extremities: no edema or skin discoloration or tenderness  Pelvic: Vulva normal  Bartholin, Urethra, Skene Glands: Within normal limits             Vagina: No gross lesions or discharge.   Cervix: No gross lesions or discharge.  Pap reflex done  Uterus  AV, normal size, shape and consistency, non-tender and mobile  Adnexa   Without masses or tenderness  Anus and perineum  normal   U/A negative   Assessment/Plan:  51 y.o. female for annual exam   1. Encounter for routine gynecological examination with Papanicolaou smear of cervix Normal gynecologic exam.  Pap reflex done.  Breast exam normal.  Screening mammogram normal November 2018.  Colonoscopy normal 2014.  Health labs with family physician.  2. Dysuria Normal urine analysis.  Patient reassured. - Urinalysis, Routine w reflex microscopic  3. Surgical menopause Status post bilateral salpingo-oophorectomy in May 2017.  Menopause well-tolerated on no hormone replacement therapy.  Taking vitamin D supplements with calcium and doing regular weightbearing physical activity. - Vitamin D 1,25 dihydroxy  4. Status post bilateral salpingo-oophorectomy (BSO)  5. Screening for osteoporosis Surgical menopause.  Will follow up here for bone density. - DG Bone Density; Future   Counseling on above issues >50% x 10 minutes  Princess Bruins MD, 11:15 AM 10/14/2017

## 2017-10-16 LAB — PAP IG W/ RFLX HPV ASCU

## 2017-10-16 LAB — HUMAN PAPILLOMAVIRUS, HIGH RISK: HPV DNA High Risk: NOT DETECTED

## 2017-10-16 MED FILL — PANTOPRAZOLE SOD DR 20 MG T: 20 | 90 days supply | Qty: 90 | Fill #1

## 2017-10-17 LAB — VITAMIN D 1,25 DIHYDROXY
VITAMIN D 1, 25 (OH) TOTAL: 39 pg/mL (ref 18–72)
VITAMIN D3 1, 25 (OH): 39 pg/mL

## 2017-11-03 DIAGNOSIS — M8580 Other specified disorders of bone density and structure, unspecified site: Secondary | ICD-10-CM

## 2017-11-03 HISTORY — DX: Other specified disorders of bone density and structure, unspecified site: M85.80

## 2017-11-05 ENCOUNTER — Other Ambulatory Visit: Payer: Self-pay | Admitting: Gynecology

## 2017-11-05 DIAGNOSIS — Z13820 Encounter for screening for osteoporosis: Secondary | ICD-10-CM

## 2017-11-17 ENCOUNTER — Ambulatory Visit (INDEPENDENT_AMBULATORY_CARE_PROVIDER_SITE_OTHER): Payer: 59

## 2017-11-17 ENCOUNTER — Other Ambulatory Visit: Payer: Self-pay | Admitting: Gynecology

## 2017-11-17 DIAGNOSIS — Z13820 Encounter for screening for osteoporosis: Secondary | ICD-10-CM

## 2017-11-17 DIAGNOSIS — M8588 Other specified disorders of bone density and structure, other site: Secondary | ICD-10-CM | POA: Diagnosis not present

## 2017-11-17 DIAGNOSIS — M85851 Other specified disorders of bone density and structure, right thigh: Secondary | ICD-10-CM

## 2017-11-18 ENCOUNTER — Encounter: Payer: Self-pay | Admitting: Gynecology

## 2017-12-04 MED FILL — AMLODIPINE BESYLATE 5 MG TA: 5 | 90 days supply | Qty: 90 | Fill #1

## 2017-12-04 MED FILL — BENAZEPRIL HCL 10 MG TABLET: 10 | 90 days supply | Qty: 90 | Fill #1

## 2017-12-17 ENCOUNTER — Telehealth: Payer: Self-pay | Admitting: Family Medicine

## 2017-12-17 DIAGNOSIS — I10 Essential (primary) hypertension: Secondary | ICD-10-CM

## 2017-12-17 DIAGNOSIS — D473 Essential (hemorrhagic) thrombocythemia: Secondary | ICD-10-CM

## 2017-12-17 DIAGNOSIS — R7302 Impaired glucose tolerance (oral): Secondary | ICD-10-CM

## 2017-12-17 DIAGNOSIS — E7800 Pure hypercholesterolemia, unspecified: Secondary | ICD-10-CM

## 2017-12-17 NOTE — Telephone Encounter (Signed)
Copied from [place] [id number]. Topic: Quick Communication - See Telephone Encounter >> Dec 17, 2017  9:18 AM Cleaster Corin, NT wrote: CRM for notification. See Telephone encounter for:   12/17/17. Pt. Would like to have to have lab work prior to appt.  12-22-17 pt. Can be reached at [phone]

## 2017-12-17 NOTE — Telephone Encounter (Signed)
Pts appt is 2/25 @1 :40pm Sent to Dr. Tamala Julian to order labs.

## 2017-12-22 ENCOUNTER — Ambulatory Visit: Payer: 59

## 2017-12-22 DIAGNOSIS — E7800 Pure hypercholesterolemia, unspecified: Secondary | ICD-10-CM

## 2017-12-22 DIAGNOSIS — D473 Essential (hemorrhagic) thrombocythemia: Secondary | ICD-10-CM | POA: Diagnosis not present

## 2017-12-22 DIAGNOSIS — R7302 Impaired glucose tolerance (oral): Secondary | ICD-10-CM

## 2017-12-22 DIAGNOSIS — I10 Essential (primary) hypertension: Secondary | ICD-10-CM

## 2017-12-23 LAB — CBC WITH DIFFERENTIAL/PLATELET
Basophils Absolute: 0 10*3/uL (ref 0.0–0.2)
Basos: 0 %
EOS (ABSOLUTE): 0.3 10*3/uL (ref 0.0–0.4)
Eos: 5 %
Hematocrit: 45.1 % (ref 34.0–46.6)
Hemoglobin: 15 g/dL (ref 11.1–15.9)
IMMATURE GRANULOCYTES: 0 %
Immature Grans (Abs): 0 10*3/uL (ref 0.0–0.1)
LYMPHS ABS: 1.8 10*3/uL (ref 0.7–3.1)
Lymphs: 28 %
MCH: 30.4 pg (ref 26.6–33.0)
MCHC: 33.3 g/dL (ref 31.5–35.7)
MCV: 92 fL (ref 79–97)
MONOS ABS: 0.5 10*3/uL (ref 0.1–0.9)
Monocytes: 7 %
Neutrophils Absolute: 3.7 10*3/uL (ref 1.4–7.0)
Neutrophils: 60 %
PLATELETS: 690 10*3/uL — AB (ref 150–379)
RBC: 4.93 x10E6/uL (ref 3.77–5.28)
RDW: 14.3 % (ref 12.3–15.4)
WBC: 6.3 10*3/uL (ref 3.4–10.8)

## 2017-12-23 LAB — COMPREHENSIVE METABOLIC PANEL
ALK PHOS: 72 IU/L (ref 39–117)
ALT: 26 IU/L (ref 0–32)
AST: 29 IU/L (ref 0–40)
Albumin/Globulin Ratio: 1.6 (ref 1.2–2.2)
Albumin: 4.9 g/dL (ref 3.5–5.5)
BUN/Creatinine Ratio: 13 (ref 9–23)
BUN: 11 mg/dL (ref 6–24)
Bilirubin Total: 0.5 mg/dL (ref 0.0–1.2)
CALCIUM: 10.5 mg/dL — AB (ref 8.7–10.2)
CO2: 24 mmol/L (ref 20–29)
CREATININE: 0.87 mg/dL (ref 0.57–1.00)
Chloride: 100 mmol/L (ref 96–106)
GFR calc Af Amer: 88 mL/min/{1.73_m2} (ref >59)
GFR, EST NON AFRICAN AMERICAN: 76 mL/min/{1.73_m2} (ref >59)
Globulin, Total: 3 g/dL (ref 1.5–4.5)
Glucose: 106 mg/dL — ABNORMAL HIGH (ref 65–99)
POTASSIUM: 5.3 mmol/L — AB (ref 3.5–5.2)
Sodium: 141 mmol/L (ref 134–144)
Total Protein: 7.9 g/dL (ref 6.0–8.5)

## 2017-12-23 LAB — LIPID PANEL
CHOLESTEROL TOTAL: 190 mg/dL (ref 100–199)
Chol/HDL Ratio: 3.7 ratio (ref 0.0–4.4)
HDL: 51 mg/dL (ref >39)
LDL Calculated: 110 mg/dL — ABNORMAL HIGH (ref 0–99)
Triglycerides: 146 mg/dL (ref 0–149)
VLDL CHOLESTEROL CAL: 29 mg/dL (ref 5–40)

## 2017-12-23 LAB — PARATHYROID HORMONE, INTACT (NO CA): PTH: 30 pg/mL (ref 15–65)

## 2017-12-23 LAB — HEMOGLOBIN A1C
ESTIMATED AVERAGE GLUCOSE: 126 mg/dL
HEMOGLOBIN A1C: 6 % — AB (ref 4.8–5.6)

## 2017-12-23 LAB — TSH: TSH: 4.31 u[IU]/mL (ref 0.450–4.500)

## 2017-12-28 ENCOUNTER — Encounter: Payer: Self-pay | Admitting: Family Medicine

## 2017-12-28 ENCOUNTER — Ambulatory Visit (INDEPENDENT_AMBULATORY_CARE_PROVIDER_SITE_OTHER): Payer: 59 | Admitting: Family Medicine

## 2017-12-28 VITALS — BP 138/72 | HR 105 | Temp 98.0°F | Resp 16 | Ht 66.93 in | Wt 226.0 lb

## 2017-12-28 DIAGNOSIS — D473 Essential (hemorrhagic) thrombocythemia: Secondary | ICD-10-CM | POA: Diagnosis not present

## 2017-12-28 DIAGNOSIS — Z0000 Encounter for general adult medical examination without abnormal findings: Secondary | ICD-10-CM | POA: Diagnosis not present

## 2017-12-28 DIAGNOSIS — R7302 Impaired glucose tolerance (oral): Secondary | ICD-10-CM | POA: Diagnosis not present

## 2017-12-28 DIAGNOSIS — I10 Essential (primary) hypertension: Secondary | ICD-10-CM | POA: Diagnosis not present

## 2017-12-28 DIAGNOSIS — R0681 Apnea, not elsewhere classified: Secondary | ICD-10-CM

## 2017-12-28 DIAGNOSIS — R0683 Snoring: Secondary | ICD-10-CM

## 2017-12-28 DIAGNOSIS — K219 Gastro-esophageal reflux disease without esophagitis: Secondary | ICD-10-CM | POA: Diagnosis not present

## 2017-12-28 DIAGNOSIS — Z23 Encounter for immunization: Secondary | ICD-10-CM | POA: Diagnosis not present

## 2017-12-28 NOTE — Patient Instructions (Addendum)
IF you received an x-ray today, you will receive an invoice from [facility]. Please contact [facility] at [phone] with questions or concerns regarding your invoice.   IF you received labwork today, you will receive an invoice from [facility]. Please contact [facility] at [phone] with questions or concerns regarding your invoice.   Our billing staff will not be able to assist you with questions regarding bills from these companies.  You will be contacted with the lab results as soon as they are available. The fastest way to get your results is to activate your My Chart account. Instructions are located on the last page of this paperwork. If you have not heard from [place] regarding the results in 2 weeks, please contact this office.      Preventive Care 40-64 Years, Female Preventive care refers to lifestyle choices and visits with your health care provider that can promote health and wellness. What does preventive care include?  A yearly physical exam. This is also called an annual well check.  Dental exams once or twice a year.  Routine eye exams. Ask your health care provider how often you should have your eyes checked.  Personal lifestyle choices, including: ? Daily care of your teeth and gums. ? Regular physical activity. ? Eating a healthy diet. ? Avoiding tobacco and drug use. ? Limiting alcohol use. ? Practicing safe sex. ? Taking low-dose aspirin daily starting at age 34. ? Taking vitamin and mineral supplements as recommended by your health care provider. What happens during an annual well check? The services and screenings done by your health care provider during your annual well check will depend on your age, overall health, lifestyle risk factors, and family history of disease. Counseling Your health care provider may ask you questions about your:  Alcohol use.  Tobacco use.  Drug use.  Emotional well-being.  Home and relationship  well-being.  Sexual activity.  Eating habits.  Work and work Statistician.  Method of birth control.  Menstrual cycle.  Pregnancy history.  Screening You may have the following tests or measurements:  Height, weight, and BMI.  Blood pressure.  Lipid and cholesterol levels. These may be checked every 5 years, or more frequently if you are over 65 years old.  Skin check.  Lung cancer screening. You may have this screening every year starting at age 102 if you have a 30-pack-year history of smoking and currently smoke or have quit within the past 15 years.  Fecal occult blood test (FOBT) of the stool. You may have this test every year starting at age 33.  Flexible sigmoidoscopy or colonoscopy. You may have a sigmoidoscopy every 5 years or a colonoscopy every 10 years starting at age 23.  Hepatitis C blood test.  Hepatitis B blood test.  Sexually transmitted disease (STD) testing.  Diabetes screening. This is done by checking your blood sugar (glucose) after you have not eaten for a while (fasting). You may have this done every 1-3 years.  Mammogram. This may be done every 1-2 years. Talk to your health care provider about when you should start having regular mammograms. This may depend on whether you have a family history of breast cancer.  BRCA-related cancer screening. This may be done if you have a family history of breast, ovarian, tubal, or peritoneal cancers.  Pelvic exam and Pap test. This may be done every 3 years starting at age 3. Starting at age 55, this may be done every 5 years if  you have a Pap test in combination with an HPV test.  Bone density scan. This is done to screen for osteoporosis. You may have this scan if you are at high risk for osteoporosis.  Discuss your test results, treatment options, and if necessary, the need for more tests with your health care provider. Vaccines Your health care provider may recommend certain vaccines, such  as:  Influenza vaccine. This is recommended every year.  Tetanus, diphtheria, and acellular pertussis (Tdap, Td) vaccine. You may need a Td booster every 10 years.  Varicella vaccine. You may need this if you have not been vaccinated.  Zoster vaccine. You may need this after age 77.  Measles, mumps, and rubella (MMR) vaccine. You may need at least one dose of MMR if you were born in 1957 or later. You may also need a second dose.  Pneumococcal 13-valent conjugate (PCV13) vaccine. You may need this if you have certain conditions and were not previously vaccinated.  Pneumococcal polysaccharide (PPSV23) vaccine. You may need one or two doses if you smoke cigarettes or if you have certain conditions.  Meningococcal vaccine. You may need this if you have certain conditions.  Hepatitis A vaccine. You may need this if you have certain conditions or if you travel or work in places where you may be exposed to hepatitis A.  Hepatitis B vaccine. You may need this if you have certain conditions or if you travel or work in places where you may be exposed to hepatitis B.  Haemophilus influenzae type b (Hib) vaccine. You may need this if you have certain conditions.  Talk to your health care provider about which screenings and vaccines you need and how often you need them. This information is not intended to replace advice given to you by your health care provider. Make sure you discuss any questions you have with your health care provider. Document Released: 11/16/2015 Document Revised: 07/09/2016 Document Reviewed: 08/21/2015 Elsevier Interactive Patient Education  [employer].

## 2017-12-28 NOTE — Progress Notes (Signed)
Subjective:    Patient ID: [name], female    DOB: 04-Oct-1966, 52 y.o.   MRN: [id number]  12/28/2017  Annual Exam    HPI This 52 y.o. female presents for evaluation of Complete Physical Examination.  Last physical:  10-14-2017 Pap smear:   10-16-2017 [name]:  11-10-2017Teola Wong. Wong:  2014; severe diverticulosis; repeat 10 years Bone density: 2019 Eye exam: glasses; 2017 Dental exam:  Every six months.  Thrombocytosis: followed by [name].  Followed for five years 2011.  Platelet count on 12/22/17 of 690 which is stable for patient.  Hypercalcemia: taking calcium plus D one daily.  Eating a lot of yogurt.  Calcium is 10.5.  Parathyroid hormone intact normal at 30.  LFTs are normal.   Snoring: apnea witnessed by daughter. No previous sleep study.  R shoulder pain: onset one year Wong.      Visual Acuity Screening   Right eye Left eye Both eyes  Without correction:     With correction: 20/20 20/20 20/15     BP Readings from Last 3 Encounters:  12/28/17 138/72  10/14/17 (!) 148/88  06/30/17 138/81   Wt Readings from Last 3 Encounters:  12/28/17 226 lb (102.5 kg)  10/14/17 224 lb (101.6 kg)  06/30/17 217 lb (98.4 kg)   Immunization History  Administered Date(s) Administered  . Influenza Split 09/17/2011  . Influenza Whole 09/18/2008, 09/17/2009  . Influenza, Seasonal, Injecte, Preservative Fre 10/04/2012  . Influenza,inj,Quad PF,6+ Mos 09/07/2014, 09/10/2015, 10/09/2016, 06/30/2017  . Pneumococcal Polysaccharide-23 03/19/2009  . Td 09/13/2007  . Tdap 12/28/2017    Review of Systems  Constitutional: Negative for activity change, appetite change, chills, diaphoresis, fatigue, fever and unexpected weight change.  HENT: Negative for congestion, dental problem, drooling, ear discharge, ear pain, facial swelling, hearing loss, mouth sores, nosebleeds, postnasal drip, rhinorrhea, sinus pressure, sneezing, sore throat,  tinnitus, trouble swallowing and voice change.   Eyes: Negative for photophobia, pain, discharge, redness, itching and visual disturbance.  Respiratory: Positive for apnea. Negative for cough, choking, chest tightness, shortness of breath, wheezing and stridor.   Cardiovascular: Negative for chest pain, palpitations and leg swelling.  Gastrointestinal: Negative for abdominal distention, abdominal pain, anal bleeding, blood in stool, constipation, diarrhea, nausea, rectal pain and vomiting.  Endocrine: Negative for cold intolerance, heat intolerance, polydipsia, polyphagia and polyuria.  Genitourinary: Negative for decreased urine volume, difficulty urinating, dyspareunia, dysuria, enuresis, flank pain, frequency, genital sores, hematuria, menstrual problem, pelvic pain, urgency, vaginal bleeding, vaginal discharge and vaginal pain.       Nocturia x 1.  Urinary incontinece none.  Musculoskeletal: Positive for arthralgias. Negative for back pain, gait problem, joint swelling, myalgias, neck pain and neck stiffness.  Skin: Negative for color change, pallor, rash and wound.  Allergic/Immunologic: Negative for environmental allergies, food allergies and immunocompromised state.  Neurological: Negative for dizziness, tremors, seizures, syncope, facial asymmetry, speech difficulty, weakness, light-headedness, numbness and headaches.  Hematological: Negative for adenopathy. Does not bruise/bleed easily.  Psychiatric/Behavioral: Negative for agitation, behavioral problems, confusion, decreased concentration, dysphoric mood, hallucinations, self-injury, sleep disturbance and suicidal ideas. The patient is not nervous/anxious and is not hyperactive.        Bedtime 100; wakes up 800    Past Medical History:  Diagnosis Date  . Anemia   . Arthritis   . Diverticulosis of colon (without mention of hemorrhage)   . FIBROIDS, UTERUS   . GERD   . HYPERTENSION   . NSVD (normal spontaneous vaginal delivery)  X3  . Obesity, unspecified   . Osteopenia 11/2017   T score -1.1 FRAX 4.8% / 0.2%  . Other abnormal glucose   . THROMBOCYTHEMIA    Past Surgical History:  Procedure Laterality Date  . Wong    . DIAGNOSTIC LAPAROSCOPY    . HYSTEROSCOPY  06/19/2005   ASPIRATION OF LEFT PARATUBAL CYST  . KNEE ARTHROSCOPY Right 16-Feb-2005   Dr Ronnie Derby  . LAPAROSCOPIC BILATERAL SALPINGO OOPHERECTOMY Bilateral 03/04/2016   Procedure: LAPAROSCOPIC BILATERAL SALPINGO OOPHORECTOMY with pelvic washings;  Surgeon: Terrance Mass, MD;  Location: [facility];  Service: Gynecology;  Laterality: Bilateral;  . LYSIS OF ADHESION N/A 03/04/2016   Procedure: LYSIS OF ADHESION;  Surgeon: Terrance Mass, MD;  Location: [facility];  Service: Gynecology;  Laterality: N/A;  . RESECTOSCOPIC POLYPECTOMY    . TOTAL KNEE ARTHROPLASTY Right 10/10/2015   Procedure: TOTAL RIGHT KNEE ARTHROPLASTY;  Surgeon: Frederik Pear, MD;  Location: [facility];  Service: Orthopedics;  Laterality: Right;  . TUBAL LIGATION  February 16, 2005   FILSHIE CLIP TECHNIQUE  . UTERINE FIBROID SURGERY     No Known Allergies Current Outpatient Medications on File Prior to Visit  Medication Sig Dispense Refill  . aspirin 81 MG tablet Take 81 mg by mouth at bedtime.     . calcium-vitamin D (OSCAL WITH D) 500-200 MG-UNIT tablet Take 1 tablet by mouth at bedtime.    . ferrous sulfate 325 (65 FE) MG EC tablet Take 325 mg by mouth daily with breakfast.    . loratadine (ALLERGY) 10 MG tablet Take 10 mg by mouth at bedtime.     Marland Kitchen nystatin-triamcinolone (MYCOLOG II) cream Apply topically 2 (two) times daily. 30 g 2   No current facility-administered medications on file prior to visit.    Social History   Socioeconomic History  . Marital status: Widowed    Spouse name: Not on file  . Number of children: 3  . Years of education: Not on file  . Highest education level: Not on file  Social Needs  . Financial resource strain: Not on file  . Food insecurity - worry: Not on file  . Food  insecurity - inability: Not on file  . Transportation needs - medical: Not on file  . Transportation needs - non-medical: Not on file  Occupational History  . Occupation: homemaker  Tobacco Use  . Smoking status: Never Smoker  . Smokeless tobacco: Never Used  Substance and Sexual Activity  . Alcohol use: Yes    Alcohol/week: 0.0 oz    Comment: very occassionally  . Drug use: No  . Sexual activity: Not Currently    Birth control/protection: Post-menopausal    Comment: TUBAL LIGATION  Other Topics Concern  . Not on file  Social History Narrative   Marital status: widowed since 17-Feb-2015; Husband was a Podiatrist in 2015/02/17; died of esophageal cancer.      Children: 2 daughters, 1 son; no grandchildren      Lives: with 2 daughters, daughter's fiance.      Employment: homemaker     Tobacco; none      Alcohol: none      Exercise:  Walking or biking or yoga.       Seatbelt: 100%    Family History  Problem Relation Age of Onset  . Breast cancer Mother 86       breast cancer  . Cancer Mother 81       breast cancer  . Stroke Mother   .  AVM Mother   . Diabetes Father   . Hypertension Father   . Heart disease Father 60       CABG/CAD  . Hyperlipidemia Father   . Diabetes Maternal Grandmother   . Colon cancer Neg Hx   . Stomach cancer Neg Hx   . Rectal cancer Neg Hx        Objective:    BP 138/72   Pulse (!) 105   Temp 98 F (36.7 C) (Oral)   Resp 16   Ht 5' 6.93" (1.7 m)   Wt 226 lb (102.5 kg)   LMP 12/24/2015 (Approximate)   SpO2 95%   BMI 35.47 kg/m  Physical Exam  Constitutional: She is oriented to person, place, and time. She appears well-developed and well-nourished. No distress.  HENT:  Head: Normocephalic and atraumatic.  Right Ear: Hearing, tympanic membrane, external ear and ear canal normal.  Left Ear: Hearing, tympanic membrane, external ear and ear canal normal.  Nose: Nose normal.  Mouth/Throat: Oropharynx is clear and moist.  Eyes: Conjunctivae and EOM  are normal. Pupils are equal, round, and reactive to light.  Neck: Normal range of motion and full passive range of motion without pain. Neck supple. No JVD present. Carotid bruit is not present. No thyromegaly present.  Cardiovascular: Normal rate, regular rhythm, normal heart sounds and intact distal pulses. Exam reveals no gallop and no friction rub.  No murmur heard. Pulmonary/Chest: Effort normal and breath sounds normal. No respiratory distress. She has no wheezes. She has no rales.  Abdominal: Soft. Bowel sounds are normal. She exhibits no distension and no mass. There is no tenderness. There is no rebound and no guarding.  Musculoskeletal:       Right shoulder: She exhibits decreased range of motion, tenderness, pain, spasm and decreased strength. She exhibits no bony tenderness and normal pulse.       Left shoulder: Normal.       Cervical back: Normal.  Lymphadenopathy:    She has no cervical adenopathy.  Neurological: She is alert and oriented to person, place, and time. She has normal reflexes. No cranial nerve deficit. She exhibits normal muscle tone. Coordination normal.  Skin: Skin is warm and dry. No rash noted. She is not diaphoretic. No erythema. No pallor.  Psychiatric: She has a normal mood and affect. Her behavior is normal. Judgment and thought content normal.  Nursing note and vitals reviewed.  No results found. Depression screen [facility] 2/9 12/28/2017 06/30/2017 12/30/2016 12/15/2014 12/14/2013  Decreased Interest 0 0 0 0 0  Down, Depressed, Hopeless 0 0 0 0 1  PHQ - 2 Score 0 0 0 0 1   Fall Risk  06/30/2017 12/30/2016 12/15/2014 12/14/2013  Falls in the past year? No No No No        Assessment & Plan:   1. Routine physical examination   2. Need for Tdap vaccination   3. Essential hypertension   4. Gastroesophageal reflux disease without esophagitis   5. Impaired glucose tolerance   6. THROMBOCYTHEMIA   7. Snoring   8. Witnessed episode of apnea     -anticipatory  guidance provided --- exercise, weight loss, safe driving practices, aspirin 81mg  daily. -obtain age appropriate screening labs and labs for chronic disease management. -s/p TDAP -Labs reviewed in detail during visit. -HTN: well controlled with Amlodipine, Benazepril.  Obtain urinalysis and EKG.  -Glucose Intolerance: persistent yet stable.   I recommend weight loss, exercise, and low-carbohydrate low-sugar food choices. You should AVOID:  regular sodas, sweetened tea, fruit juices.  You should LIMIT: breads, pastas, rice, potatoes, and desserts/sweets.  I would recommend limiting your total carbohydrate intake per meal to 45 grams; I would limit your total carbohydrate intake per snack to 30 grams.  I would also have a goal of 60 grams of protein intake per day; this would equal 10-15 grams of protein per meal and 5-10 grams of protein per snack. GERD: well controlled on Protonix 20mg  daily.  No changes to therapy; dietary modification recommended. Thrombocytosis: persistent; stable; s/p hematology consultation in past; no further work up warranted at this time. Hypercalcemia: persistent; HOLD vitamin D and calcium supplementation until next visit; PTH normal.  Iron deficiency: resolved; normal hemoglobin; stop iron supplementation. Witnessed apnea with snoring: New; refer for sleep study at next visit. Obesity: recommend weight loss, exercise for 30-60 minutes five days per week; recommend 1200 kcal restriction per day with a minimum of 60 grams of protein per day.   Orders Placed This Encounter  Procedures  . Tdap vaccine greater than or equal to 7yo IM  . Urinalysis, dipstick only  . EKG 12-Lead   Meds ordered this encounter  Medications  . amLODipine (NORVASC) 5 MG tablet    Sig: Take 1 tablet (5 mg total) by mouth daily.    Dispense:  90 tablet    Refill:  1  . benazepril (LOTENSIN) 10 MG tablet    Sig: Take 1 tablet (10 mg total) by mouth daily.    Dispense:  90 tablet    Refill:   1  . pantoprazole (PROTONIX) 20 MG tablet    Sig: Take 1 tablet (20 mg total) by mouth daily.    Dispense:  90 tablet    Refill:  1    Return in about 6 months (around 06/27/2018) for follow-up chronic medical conditions.   Eugene Isadore Elayne Guerin, M.D. Primary Care at [facility] previously [facility] [place] [address] [place], [place]  [address] [phone] phone [phone] fax

## 2017-12-29 ENCOUNTER — Encounter: Payer: Self-pay | Admitting: Family Medicine

## 2017-12-29 LAB — URINALYSIS, DIPSTICK ONLY
BILIRUBIN UA: NEGATIVE
GLUCOSE, UA: NEGATIVE
Nitrite, UA: NEGATIVE
PH UA: 5 (ref 5.0–7.5)
RBC UA: NEGATIVE
SPEC GRAV UA: 1.029 (ref 1.005–1.030)
Urobilinogen, Ur: 0.2 mg/dL (ref 0.2–1.0)

## 2018-01-05 MED ORDER — PANTOPRAZOLE SODIUM 20 MG PO TBEC: 20.0000 mg | DELAYED_RELEASE_TABLET | Freq: Every day | ORAL | 1 refills | Status: DC

## 2018-01-05 MED ORDER — AMLODIPINE BESYLATE 5 MG PO TABS: 5.0000 mg | ORAL_TABLET | Freq: Every day | ORAL | 1 refills | Status: DC

## 2018-01-05 MED ORDER — BENAZEPRIL HCL 10 MG PO TABS: 10.0000 mg | ORAL_TABLET | Freq: Every day | ORAL | 1 refills | Status: DC

## 2018-01-15 MED FILL — PANTOPRAZOLE SOD DR 20 MG T: 20 | 90 days supply | Qty: 90 | Fill #0

## 2018-02-01 ENCOUNTER — Encounter: Payer: Self-pay | Admitting: Family Medicine

## 2018-02-12 ENCOUNTER — Encounter: Payer: Self-pay | Admitting: Family Medicine

## 2018-03-08 MED FILL — BENAZEPRIL HCL 10 MG TABLET: 10 | 90 days supply | Qty: 90 | Fill #0

## 2018-03-08 MED FILL — AMLODIPINE BESYLATE 5 MG TA: 5 | 90 days supply | Qty: 90 | Fill #0

## 2018-03-29 ENCOUNTER — Encounter: Payer: Self-pay | Admitting: Family Medicine

## 2018-04-22 ENCOUNTER — Other Ambulatory Visit: Payer: Self-pay

## 2018-04-22 MED ORDER — NYSTATIN-TRIAMCINOLONE 100000-0.1 UNIT/GM-% EX CREA: TOPICAL_CREAM | Freq: Two times a day (BID) | CUTANEOUS | 1 refills | Status: DC

## 2018-04-22 MED FILL — PANTOPRAZOLE SOD DR 20 MG T: 20 | 90 days supply | Qty: 90 | Fill #1

## 2018-04-22 MED FILL — NYSTATIN-TRIAMCINOLONE CRM: 100000-0.1 | 15 days supply | Qty: 30 | Fill #0

## 2018-05-12 DIAGNOSIS — D2261 Melanocytic nevi of right upper limb, including shoulder: Secondary | ICD-10-CM | POA: Diagnosis not present

## 2018-05-12 DIAGNOSIS — L821 Other seborrheic keratosis: Secondary | ICD-10-CM | POA: Diagnosis not present

## 2018-05-12 DIAGNOSIS — D225 Melanocytic nevi of trunk: Secondary | ICD-10-CM | POA: Diagnosis not present

## 2018-05-12 DIAGNOSIS — D2272 Melanocytic nevi of left lower limb, including hip: Secondary | ICD-10-CM | POA: Diagnosis not present

## 2018-05-12 MED FILL — DESONIDE 0.05% OINTMENT: 0.05 | 14 days supply | Qty: 15 | Fill #0

## 2018-06-03 MED FILL — BENAZEPRIL HCL 10 MG TABLET: 10 | 90 days supply | Qty: 90 | Fill #1

## 2018-06-03 MED FILL — AMLODIPINE BESYLATE 5 MG TA: 5 | 90 days supply | Qty: 90 | Fill #1

## 2018-06-30 ENCOUNTER — Ambulatory Visit: Payer: 59 | Admitting: Family Medicine

## 2018-07-22 NOTE — Progress Notes (Signed)
Patient: [name] MRN: [id number] DOB: 1966-10-23 PCP: Orma Flaming, MD     Subjective:  Chief Complaint  Patient presents with  . Establish Care  . Hypertension  . Hyperglycemia    HPI: The patient is a 52 y.o. female who presents today for hypertension, prediabetes, follow up.   Hypertension: Here for follow up of hypertension.  Currently on benazepril 10mg  and norvasc 5mg . Does not take blood pressure at home. Takes medication as prescribed and denies any side effects. Exercise includes recumbent biking and walking. Weight has been decreasing. Denies any chest pain, headaches, shortness of breath, vision changes, swelling in lower extremities. She states she has been on this dose forever.   Prediabetes: Her a1c was 6.0 on last check. She has lost weight. Following diabetic diet and exercising.   Last note she had witnessed apneic episodes. Referral to sleep study needed today. +snoring at night and apneic episode witnessed by her daughter.   Review of Systems  Constitutional: Negative for fatigue.  Respiratory: Negative for shortness of breath.   Cardiovascular: Negative for chest pain.  Gastrointestinal: Negative for abdominal pain and nausea.  Neurological: Negative for dizziness and headaches.  Psychiatric/Behavioral: Positive for sleep disturbance. The patient is nervous/anxious.     Allergies Patient has No Known Allergies.  Past Medical History Patient  has a past medical history of Anemia, Arthritis, Diverticulosis of colon (without mention of hemorrhage), FIBROIDS, UTERUS, GERD, HYPERTENSION, NSVD (normal spontaneous vaginal delivery), Obesity, unspecified, Osteopenia (11/2017), Other abnormal glucose, and THROMBOCYTHEMIA.  Surgical History Patient  has a past surgical history that includes Knee arthroscopy (Right, 2006); RESECTOSCOPIC POLYPECTOMY; Hysteroscopy (06/19/2005); Colonoscopy; Tubal ligation (2006); Uterine fibroid surgery; Total knee arthroplasty  (Right, 10/10/2015); Diagnostic laparoscopy; Laparoscopic bilateral salpingo oophorectomy (Bilateral, 03/04/2016); and Lysis of adhesion (N/A, 03/04/2016).  Family History Pateint's family history includes AVM in her mother; Breast cancer (age of onset: 72) in her mother; Cancer (age of onset: 43) in her mother; Diabetes in her father and maternal grandmother; Heart disease (age of onset: 66) in her father; Hyperlipidemia in her father; Hypertension in her father; Stroke in her mother.  Social History Patient  reports that she has quit smoking. Her smoking use included cigarettes. She has never used smokeless tobacco. She reports that she drinks alcohol. She reports that she does not use drugs.    Objective: Vitals:   07/23/18 1023 07/23/18 1027 07/23/18 1054  BP: (!) 142/94 (!) 142/82 (!) 150/80  Pulse: 74    Temp: 97.9 F (36.6 C)    TempSrc: Oral    SpO2: 98%    Weight: 221 lb (100.2 kg)    Height: 5' 6.93" (1.7 m)      Body mass index is 34.69 kg/m.  Physical Exam  Constitutional: She is oriented to person, place, and time. She appears well-developed and well-nourished.  HENT:  Right Ear: External ear normal.  Left Ear: External ear normal.  Mouth/Throat: Oropharynx is clear and moist.  Eyes: Pupils are equal, round, and reactive to light. Conjunctivae and EOM are normal.  Neck: Normal range of motion. Neck supple. No thyromegaly present.  Cardiovascular: Normal rate, regular rhythm, normal heart sounds and intact distal pulses.  No murmur heard. Pulmonary/Chest: Effort normal and breath sounds normal.  Abdominal: Soft. Bowel sounds are normal. She exhibits no distension. There is no tenderness.  Lymphadenopathy:    She has no cervical adenopathy.  Neurological: She is alert and oriented to person, place, and time. She displays normal reflexes. No cranial  nerve deficit. Coordination normal.  Skin: Skin is warm and dry. No rash noted.  Psychiatric: She has a normal mood and  affect. Her behavior is normal.  Vitals reviewed.      Assessment/plan: 1. Essential hypertension Blood pressure is not to goal. Continue current anti-hypertensive medications. We are going to increase her norvasc to 10mg  and continue the benzapril at 10mg . Asked that she keep a log for me and we will see her back in 3 months for a recheck. Refills given and routine lab work will be done today. Recommended routine exercise and healthy diet including DASH diet and mediterranean diet. Encouraged weight loss. F/u in 3 months.   - CBC with Differential/Platelet - Comprehensive metabolic panel  2. Prediabetes Follow diabetic diet. Was well controlled on last check.  - Hemoglobin A1c  3. Witnessed apneic spells  - Ambulatory referral to Pulmonology   Return in about 3 months (around 10/22/2018) for htn check up .   Orma Flaming, MD [place]   07/23/2018

## 2018-07-23 ENCOUNTER — Ambulatory Visit: Payer: BLUE CROSS/BLUE SHIELD | Admitting: Family Medicine

## 2018-07-23 ENCOUNTER — Encounter: Payer: Self-pay | Admitting: Family Medicine

## 2018-07-23 VITALS — BP 150/80 | HR 74 | Temp 97.9°F | Ht 66.93 in | Wt 221.0 lb

## 2018-07-23 DIAGNOSIS — R7303 Prediabetes: Secondary | ICD-10-CM

## 2018-07-23 DIAGNOSIS — R0681 Apnea, not elsewhere classified: Secondary | ICD-10-CM | POA: Diagnosis not present

## 2018-07-23 DIAGNOSIS — I10 Essential (primary) hypertension: Secondary | ICD-10-CM | POA: Diagnosis not present

## 2018-07-23 LAB — COMPREHENSIVE METABOLIC PANEL
ALK PHOS: 66 U/L (ref 39–117)
ALT: 18 U/L (ref 0–35)
AST: 16 U/L (ref 0–37)
Albumin: 4.7 g/dL (ref 3.5–5.2)
BILIRUBIN TOTAL: 0.6 mg/dL (ref 0.2–1.2)
BUN: 13 mg/dL (ref 6–23)
CO2: 28 mEq/L (ref 19–32)
CREATININE: 0.82 mg/dL (ref 0.40–1.20)
Calcium: 9.7 mg/dL (ref 8.4–10.5)
Chloride: 103 mEq/L (ref 96–112)
GFR: 77.08 mL/min (ref >60.00)
GLUCOSE: 108 mg/dL — AB (ref 70–99)
POTASSIUM: 3.9 meq/L (ref 3.5–5.1)
SODIUM: 141 meq/L (ref 135–145)
TOTAL PROTEIN: 7.9 g/dL (ref 6.0–8.3)

## 2018-07-23 LAB — CBC WITH DIFFERENTIAL/PLATELET
Basophils Absolute: 0.1 10*3/uL (ref 0.0–0.1)
Basophils Relative: 0.9 % (ref 0.0–3.0)
Eosinophils Absolute: 0.2 10*3/uL (ref 0.0–0.7)
Eosinophils Relative: 3.5 % (ref 0.0–5.0)
HCT: 44.9 % (ref 36.0–46.0)
Hemoglobin: 15.2 g/dL — ABNORMAL HIGH (ref 12.0–15.0)
LYMPHS ABS: 1.4 10*3/uL (ref 0.7–4.0)
Lymphocytes Relative: 22.3 % (ref 12.0–46.0)
MCHC: 33.8 g/dL (ref 30.0–36.0)
MCV: 87.6 fl (ref 78.0–100.0)
MONO ABS: 0.5 10*3/uL (ref 0.1–1.0)
Monocytes Relative: 7.5 % (ref 3.0–12.0)
NEUTROS PCT: 65.8 % (ref 43.0–77.0)
Neutro Abs: 4 10*3/uL (ref 1.4–7.7)
Platelets: 654 10*3/uL — ABNORMAL HIGH (ref 150.0–400.0)
RBC: 5.13 Mil/uL — AB (ref 3.87–5.11)
RDW: 14 % (ref 11.5–15.5)
WBC: 6.1 10*3/uL (ref 4.0–10.5)

## 2018-07-23 LAB — HEMOGLOBIN A1C: Hgb A1c MFr Bld: 6.1 % (ref 4.6–6.5)

## 2018-07-23 MED ORDER — PANTOPRAZOLE SODIUM 20 MG PO TBEC: 20.0000 mg | DELAYED_RELEASE_TABLET | Freq: Every day | ORAL | 1 refills | Status: DC

## 2018-07-23 MED ORDER — AMLODIPINE BESYLATE 10 MG PO TABS: 10.0000 mg | ORAL_TABLET | Freq: Every day | ORAL | 3 refills | Status: DC

## 2018-07-23 MED FILL — PANTOPRAZOLE SOD DR 20 MG T: 20 | 90 days supply | Qty: 90 | Fill #0

## 2018-07-23 MED FILL — AMLODIPINE BESYLATE 10 MG T: 10 | 90 days supply | Qty: 90 | Fill #0

## 2018-07-23 NOTE — Patient Instructions (Signed)
Health Maintenance Due  Topic Date Due  . INFLUENZA VACCINE -please call our office to schedule this in October/November 06/03/2018  . HEMOGLOBIN A1C -done today 06/21/2018  . FOOT EXAM -done today 06/30/2018  . OPHTHALMOLOGY EXAM -has appt scheduled next month 06/30/2018

## 2018-08-19 DIAGNOSIS — H5203 Hypermetropia, bilateral: Secondary | ICD-10-CM | POA: Diagnosis not present

## 2018-08-19 DIAGNOSIS — H04123 Dry eye syndrome of bilateral lacrimal glands: Secondary | ICD-10-CM | POA: Diagnosis not present

## 2018-09-06 ENCOUNTER — Other Ambulatory Visit: Payer: Self-pay | Admitting: Family Medicine

## 2018-09-06 NOTE — Telephone Encounter (Signed)
Copied from [place] [id number]. Topic: Quick Communication - Rx Refill/Question >> Sep 06, 2018  3:47 PM Blase Mess A wrote: Medication: benazepril (LOTENSIN) 10 MG tablet [[id number]]   Has the patient contacted their pharmacy? Yes  (Agent: If no, request that the patient contact the pharmacy for the refill.) (Agent: If yes, when and what did the pharmacy advise?)  Preferred Pharmacy (with phone number or street name): Preferred Pharmacy    [facility] [address] [facility] [place] [address] Phone: [phone] Fax: [phone]    Agent: Please be advised that RX refills may take up to 3 business days. We ask that you follow-up with your pharmacy.

## 2018-09-06 NOTE — Addendum Note (Signed)
Addended by: Dimple Nanas on: 09/06/2018 04:19 PM   Modules accepted: Orders

## 2018-09-06 NOTE — Telephone Encounter (Signed)
Requested medication (s) are due for refill today: yes  Requested medication (s) are on the active medication list: yes   Last refill:  By a different provider  Future visit scheduled: yes, 10/15/18  Notes to clinic:  LOV on 07/23/18 with Dr. Rogers Blocker    Requested Prescriptions  Pending Prescriptions Disp Refills   benazepril (LOTENSIN) 10 MG tablet 90 tablet 1    Sig: Take 1 tablet (10 mg total) by mouth daily.     Cardiovascular:  ACE Inhibitors Failed - 09/06/2018  4:19 PM      Failed - Last BP in normal range    BP Readings from Last 1 Encounters:  07/23/18 (!) 150/80         Passed - Cr in normal range and within 180 days    Creat  Date Value Ref Range Status  10/09/2016 0.96 0.50 - 1.05 mg/dL Final    Comment:      For patients > or = 54 years of age: The upper reference limit for Creatinine is approximately 13% higher for people identified as African-American.      Creatinine, Ser  Date Value Ref Range Status  07/23/2018 0.82 0.40 - 1.20 mg/dL Final         Passed - K in normal range and within 180 days    Potassium  Date Value Ref Range Status  07/23/2018 3.9 3.5 - 5.1 mEq/L Final         Passed - Patient is not pregnant      Passed - Valid encounter within last 6 months    Recent Outpatient Visits          1 month ago Essential hypertension   [facility], Ebony Hail, MD   8 months ago Routine physical examination   [facility] at [facility], Renette Butters, MD   1 year ago Essential hypertension   [facility] at [facility], Renette Butters, MD   1 year ago Routine physical examination   [facility] at [facility], Renette Butters, MD   2 years ago Preventative health care   [facility] -Georges Mouse, MD      Future Appointments            In 1 month Orma Flaming, MD [place]

## 2018-09-07 NOTE — Telephone Encounter (Signed)
Ok to fill 

## 2018-09-08 ENCOUNTER — Other Ambulatory Visit: Payer: Self-pay | Admitting: Family Medicine

## 2018-09-08 MED ORDER — BENAZEPRIL HCL 10 MG PO TABS: 10.0000 mg | ORAL_TABLET | Freq: Every day | ORAL | 1 refills | Status: DC

## 2018-09-08 MED FILL — BENAZEPRIL HCL 10 MG TABLET: 10 | 90 days supply | Qty: 90 | Fill #0

## 2018-10-07 DIAGNOSIS — Z1231 Encounter for screening mammogram for malignant neoplasm of breast: Secondary | ICD-10-CM | POA: Diagnosis not present

## 2018-10-07 DIAGNOSIS — Z803 Family history of malignant neoplasm of breast: Secondary | ICD-10-CM | POA: Diagnosis not present

## 2018-10-15 ENCOUNTER — Ambulatory Visit: Payer: BLUE CROSS/BLUE SHIELD | Admitting: Family Medicine

## 2018-10-15 ENCOUNTER — Encounter: Payer: Self-pay | Admitting: Family Medicine

## 2018-10-15 VITALS — BP 128/80 | HR 78 | Temp 97.8°F | Ht 66.93 in | Wt 225.8 lb

## 2018-10-15 DIAGNOSIS — R7303 Prediabetes: Secondary | ICD-10-CM | POA: Diagnosis not present

## 2018-10-15 DIAGNOSIS — I10 Essential (primary) hypertension: Secondary | ICD-10-CM | POA: Diagnosis not present

## 2018-10-15 DIAGNOSIS — Z23 Encounter for immunization: Secondary | ICD-10-CM | POA: Diagnosis not present

## 2018-10-15 DIAGNOSIS — Z0000 Encounter for general adult medical examination without abnormal findings: Secondary | ICD-10-CM | POA: Diagnosis not present

## 2018-10-15 NOTE — Progress Notes (Signed)
Patient: [name] MRN: [id number] DOB: 10/06/1966 PCP: Orma Flaming, MD     Subjective:  Chief Complaint  Patient presents with  . Hypertension    HPI: The patient is a 52 y.o. female who presents today for hypertension.   Hypertension: Here for follow up of hypertension.  Currently on amlodipine 10mg  and benzapril 10mg . We increased her norvasc to 10mg  .  medication as prescribed and denies any side effects. Exercise includes walking. Weight has been stable. Denies any chest pain, headaches, shortness of breath, vision changes, swelling in lower extremities.   Flu shot today.   Review of Systems  Constitutional: Negative for fatigue.  Respiratory: Negative for shortness of breath.   Cardiovascular: Negative for chest pain.  Gastrointestinal: Negative for abdominal pain and nausea.  Musculoskeletal: Negative for back pain and neck pain.  Neurological: Negative for dizziness and headaches.  Psychiatric/Behavioral: Negative for dysphoric mood and sleep disturbance. The patient is not nervous/anxious.     Allergies Patient has No Known Allergies.  Past Medical History Patient  has a past medical history of Anemia, Arthritis, Diverticulosis of colon (without mention of hemorrhage), FIBROIDS, UTERUS, GERD, HYPERTENSION, NSVD (normal spontaneous vaginal delivery), Obesity, unspecified, Osteopenia (11/2017), Other abnormal glucose, and THROMBOCYTHEMIA.  Surgical History Patient  has a past surgical history that includes Knee arthroscopy (Right, 2006); RESECTOSCOPIC POLYPECTOMY; Hysteroscopy (06/19/2005); Colonoscopy; Tubal ligation (2006); Uterine fibroid surgery; Total knee arthroplasty (Right, 10/10/2015); Diagnostic laparoscopy; Laparoscopic bilateral salpingo oophorectomy (Bilateral, 03/04/2016); and Lysis of adhesion (N/A, 03/04/2016).  Family History Pateint's family history includes AVM in her mother; Breast cancer (age of onset: 52) in her mother; Cancer (age of onset: 75)  in her mother; Diabetes in her father and maternal grandmother; Heart disease (age of onset: 79) in her father; Hyperlipidemia in her father; Hypertension in her father; Stroke in her mother.  Social History Patient  reports that she has quit smoking. Her smoking use included cigarettes. She has never used smokeless tobacco. She reports current alcohol use. She reports that she does not use drugs.    Objective: Vitals:   10/15/18 1018  BP: 128/80  Pulse: 78  Temp: 97.8 F (36.6 C)  TempSrc: Oral  SpO2: 97%  Weight: 225 lb 12.8 oz (102.4 kg)  Height: 5' 6.93" (1.7 m)    Body mass index is 35.44 kg/m.  Physical Exam Vitals signs reviewed.  Constitutional:      Appearance: Normal appearance.  Cardiovascular:     Rate and Rhythm: Normal rate and regular rhythm.     Heart sounds: Normal heart sounds.  Pulmonary:     Effort: Pulmonary effort is normal.     Breath sounds: Normal breath sounds.  Abdominal:     General: Abdomen is flat. Bowel sounds are normal.     Palpations: Abdomen is soft.  Neurological:     General: No focal deficit present.     Mental Status: She is alert and oriented to person, place, and time.  Psychiatric:        Mood and Affect: Mood normal.        Behavior: Behavior normal.        Assessment/plan: Blood pressure is to goal. Continue current anti-hypertensive medications. Refills not given. Recommended routine exercise and healthy diet including DASH diet and mediterranean diet. Encouraged weight loss. F/u in 6 months.    Will need a1c as well at that time and cholesterol/urine. Will come back for annual/recheck in 6 months. Future labs ordered.     Return  in about 6 months (around 04/16/2019) for labs first then appt. come first week of june.   Orma Flaming, MD [place]   10/15/2018

## 2018-10-15 NOTE — Addendum Note (Signed)
Addended by: Kevan Ny on: 10/15/2018 11:53 AM   Modules accepted: Orders

## 2018-10-18 ENCOUNTER — Ambulatory Visit: Payer: BLUE CROSS/BLUE SHIELD | Admitting: Obstetrics & Gynecology

## 2018-10-18 ENCOUNTER — Encounter: Payer: Self-pay | Admitting: Obstetrics & Gynecology

## 2018-10-18 VITALS — BP 138/86 | Ht 65.5 in | Wt 224.0 lb

## 2018-10-18 DIAGNOSIS — Z780 Asymptomatic menopausal state: Secondary | ICD-10-CM | POA: Diagnosis not present

## 2018-10-18 DIAGNOSIS — Z01419 Encounter for gynecological examination (general) (routine) without abnormal findings: Secondary | ICD-10-CM

## 2018-10-18 DIAGNOSIS — E6609 Other obesity due to excess calories: Secondary | ICD-10-CM | POA: Diagnosis not present

## 2018-10-18 DIAGNOSIS — M85851 Other specified disorders of bone density and structure, right thigh: Secondary | ICD-10-CM | POA: Diagnosis not present

## 2018-10-18 DIAGNOSIS — Z6836 Body mass index (BMI) 36.0-36.9, adult: Secondary | ICD-10-CM

## 2018-10-18 NOTE — Progress Notes (Signed)
[name] 1965/11/15 [id number]   History:    52 y.o. G3P3L3 Widowed x 3 1/2 yrs  RP:  Established patient presenting for annual gyn exam   HPI: Surgical menopause post BSO, well on no hormone replacement therapy.  No postmenopausal bleeding.  No pelvic pain.  Abstinent.  Urine and bowel movements normal.  Breasts normal.  Body mass index 36.71.  Planning to increase physical activity and start back on Nutrisystem.  Health labs with family physician.  Past medical history,surgical history, family history and social history were all reviewed and documented in the EPIC chart.  Gynecologic History Patient's last menstrual period was 12/24/2015 (lmp unknown). Contraception: post menopausal status Last Pap: 10/2017. Results were: ASCUS/HPV HR neg Last mammogram: 10/2018. Results were: normal per patient, will obtain report Bone Density: 11/2017 Osteopenia T-Score -1.1 at Rt Femoral Neck Colonoscopy: 2014  Obstetric History OB History  Gravida Para Term Preterm AB Living  3 3 3     3   SAB TAB Ectopic Multiple Live Births          3    # Outcome Date GA Lbr Len/2nd Weight Sex Delivery Anes PTL Lv  3 Term     M Vag-Spont  N LIV  2 Term     F Vag-Spont  N LIV  1 Term     F Vag-Spont  N LIV     ROS: A ROS was performed and pertinent positives and negatives are included in the history.  GENERAL: No fevers or chills. HEENT: No change in vision, no earache, sore throat or sinus congestion. NECK: No pain or stiffness. CARDIOVASCULAR: No chest pain or pressure. No palpitations. PULMONARY: No shortness of breath, cough or wheeze. GASTROINTESTINAL: No abdominal pain, nausea, vomiting or diarrhea, melena or bright red blood per rectum. GENITOURINARY: No urinary frequency, urgency, hesitancy or dysuria. MUSCULOSKELETAL: No joint or muscle pain, no back pain, no recent trauma. DERMATOLOGIC: No rash, no itching, no lesions. ENDOCRINE: No polyuria, polydipsia, no heat or cold intolerance. No  recent change in weight. HEMATOLOGICAL: No anemia or easy bruising or bleeding. NEUROLOGIC: No headache, seizures, numbness, tingling or weakness. PSYCHIATRIC: No depression, no loss of interest in normal activity or change in sleep pattern.     Exam:   BP 138/86   Ht 5' 5.5" (1.664 m)   Wt 224 lb (101.6 kg)   LMP 12/24/2015 (LMP Unknown)   BMI 36.71 kg/m   Body mass index is 36.71 kg/m.  General appearance : Well developed well nourished female. No acute distress HEENT: Eyes: no retinal hemorrhage or exudates,  Neck supple, trachea midline, no carotid bruits, no thyroidmegaly Lungs: Clear to auscultation, no rhonchi or wheezes, or rib retractions  Heart: Regular rate and rhythm, no murmurs or gallops Breast:Examined in sitting and supine position were symmetrical in appearance, no palpable masses or tenderness,  no skin retraction, no nipple inversion, no nipple discharge, no skin discoloration, no axillary or supraclavicular lymphadenopathy Abdomen: no palpable masses or tenderness, no rebound or guarding Extremities: no edema or skin discoloration or tenderness  Pelvic: Vulva: Normal             Vagina: No gross lesions or discharge  Cervix: No gross lesions or discharge.  Pap reflex done  Uterus  AV, normal size, shape and consistency, non-tender and mobile  Adnexa  Without masses or tenderness  Anus: Normal   Assessment/Plan:  52 y.o. female for annual exam   1. Encounter for routine gynecological examination  with Papanicolaou smear of cervix Normal gynecologic exam.  Pap reflex done.  Pap test last year showed ASCUS with negative high-risk HPV.  Breast exam normal.  Last screening mammogram in December 2019 was normal per patient, will obtain report.  Colonoscopy in 2014.  Health labs with family physician. - Pap IG w/ reflex to HPV when ASC-U  2. Postmenopausal Well on no hormone replacement therapy.  No postmenopausal bleeding.  3. Osteopenia of neck of right  femur Recommend vitamin D supplements, calcium intake of 1.5 g/day and regular weightbearing physical activity.  4. Class 2 obesity due to excess calories without serious comorbidity with body mass index (BMI) of 36.0 to 36.9 in adult Recommend a lower calorie/carb diet such as [employer].  Aerobic physical activities 5 times a week and weightlifting every 2 days.  Princess Bruins MD, 12:23 PM 10/18/2018

## 2018-10-18 NOTE — Patient Instructions (Signed)
1. Encounter for routine gynecological examination with Papanicolaou smear of cervix Normal gynecologic exam.  Pap reflex done.  Pap test last year showed ASCUS with negative high-risk HPV.  Breast exam normal.  Last screening mammogram in December 2019 was normal per patient, will obtain report.  Colonoscopy in 2014.  Health labs with family physician. - Pap IG w/ reflex to HPV when ASC-U  2. Postmenopausal Well on no hormone replacement therapy.  No postmenopausal bleeding.  3. Osteopenia of neck of right femur Recommend vitamin D supplements, calcium intake of 1.5 g/day and regular weightbearing physical activity.  4. Class 2 obesity due to excess calories without serious comorbidity with body mass index (BMI) of 36.0 to 36.9 in adult Recommend a lower calorie/carb diet such as [employer].  Aerobic physical activities 5 times a week and weightlifting every 2 days.  [name], it was a pleasure seeing you today!  I will inform you of your results as soon as they are available.

## 2018-10-20 LAB — PAP IG W/ RFLX HPV ASCU

## 2018-11-11 MED FILL — PANTOPRAZOLE SOD DR 20 MG T: 20 | 90 days supply | Qty: 90 | Fill #1

## 2018-11-11 MED FILL — AMLODIPINE BESYLATE 10 MG T: 10 | 90 days supply | Qty: 90 | Fill #1

## 2018-11-29 ENCOUNTER — Encounter: Payer: Self-pay | Admitting: Pulmonary Disease

## 2018-11-29 ENCOUNTER — Institutional Professional Consult (permissible substitution): Payer: BLUE CROSS/BLUE SHIELD | Admitting: Pulmonary Disease

## 2018-11-29 ENCOUNTER — Ambulatory Visit: Payer: BLUE CROSS/BLUE SHIELD | Admitting: Pulmonary Disease

## 2018-11-29 VITALS — BP 126/62 | HR 60 | Ht 65.5 in | Wt 260.0 lb

## 2018-11-29 DIAGNOSIS — R0683 Snoring: Secondary | ICD-10-CM

## 2018-11-29 DIAGNOSIS — G4733 Obstructive sleep apnea (adult) (pediatric): Secondary | ICD-10-CM

## 2018-11-29 DIAGNOSIS — Z6836 Body mass index (BMI) 36.0-36.9, adult: Secondary | ICD-10-CM | POA: Diagnosis not present

## 2018-11-29 MED FILL — NYSTATIN-TRIAMCINOLONE CRM: 100000-0.1 | 15 days supply | Qty: 30 | Fill #1

## 2018-11-29 NOTE — Assessment & Plan Note (Signed)
Weight loss encouraged.  Goal would be 10% weight loss which would be at least 25 pounds

## 2018-11-29 NOTE — Assessment & Plan Note (Signed)
Given excessive daytime somnolence, narrow pharyngeal exam, witnessed apneas & loud snoring, obstructive sleep apnea is very likely & an overnight polysomnogram will be scheduled as a home study. The pathophysiology of obstructive sleep apnea , it's cardiovascular consequences & modes of treatment including CPAP were discused with the patient in detail & they evidenced understanding.  Pretest probably is intermediate.  If she needs a CPAP device she will likely need a full facemask since she is a mouth breather

## 2018-11-29 NOTE — Progress Notes (Signed)
Subjective:    Patient ID: [name], female    DOB: 1966/08/26, 53 y.o.   MRN: [id number]  HPI  Chief Complaint  Patient presents with  . Sleep Consult    Referred by Dr. Rogers Blocker for possible OSA. Patient states that she snores at night. Increased congestion.    53 year old obese woman with hypertension presents for evaluation of sleep disordered breathing. Her husband was a podiatrist who passed away suddenly in 02-05-15 from esophageal cancer.  She is coping well but does admit to occasional sadness of mood. She reports snoring that has been noted by family members and non-refreshing sleep.  She did observe her husband sleeping with witnessed apneas and feels that she may have some of the same symptoms. She is always been a night owl.  Epworth sleepiness score is 7 and she reports mild sleepiness while lying down to rest in the afternoons and other public situations. Bedtime is as late as 1 AM, she watches TV until late in the night, but does not have a TV in her bedroom, sleep latency is minimal when she actually lies down in bed, she sleeps on her side with one pillow, reports 2-3 nocturnal awakenings due to nocturia and is out of bed by 9 AM feeling tired with dryness of mouth but denies headaches. She lost 25 pounds but has gained some of this back to her current weight of 260 pounds.  She requires tube medications for blood pressure control. She reports chronic stuffiness of her nose for which she takes loratadine and she attributes this to allergies, she has 3 cats at home and hopes not to have to give them away.  She was a remote smoker until she quit in 02/04/1990  There is no history suggestive of cataplexy, sleep paralysis or parasomnias     Past Medical History:  Diagnosis Date  . Anemia   . Arthritis   . Diverticulosis of colon (without mention of hemorrhage)   . FIBROIDS, UTERUS   . GERD   . HYPERTENSION   . NSVD (normal spontaneous vaginal delivery)    X3  . Obesity,  unspecified   . Osteopenia 11/2017   T score -1.1 FRAX 4.8% / 0.2%  . Other abnormal glucose   . THROMBOCYTHEMIA    Past Surgical History:  Procedure Laterality Date  . COLONOSCOPY    . DIAGNOSTIC LAPAROSCOPY    . HYSTEROSCOPY  06/19/2005   ASPIRATION OF LEFT PARATUBAL CYST  . KNEE ARTHROSCOPY Right 02-04-2005   Dr Ronnie Derby  . LAPAROSCOPIC BILATERAL SALPINGO OOPHERECTOMY Bilateral 03/04/2016   Procedure: LAPAROSCOPIC BILATERAL SALPINGO OOPHORECTOMY with pelvic washings;  Surgeon: Terrance Mass, MD;  Location: [facility];  Service: Gynecology;  Laterality: Bilateral;  . LYSIS OF ADHESION N/A 03/04/2016   Procedure: LYSIS OF ADHESION;  Surgeon: Terrance Mass, MD;  Location: [facility];  Service: Gynecology;  Laterality: N/A;  . RESECTOSCOPIC POLYPECTOMY    . TOTAL KNEE ARTHROPLASTY Right 10/10/2015   Procedure: TOTAL RIGHT KNEE ARTHROPLASTY;  Surgeon: Frederik Pear, MD;  Location: [facility];  Service: Orthopedics;  Laterality: Right;  . TUBAL LIGATION  02/04/2005   FILSHIE CLIP TECHNIQUE  . UTERINE FIBROID SURGERY      No Known Allergies  Social History   Socioeconomic History  . Marital status: Widowed    Spouse name: Not on file  . Number of children: 3  . Years of education: Not on file  . Highest education level: Not on file  Occupational History  .  Occupation: homemaker  Social Needs  . Financial resource strain: Not on file  . Food insecurity:    Worry: Not on file    Inability: Not on file  . Transportation needs:    Medical: Not on file    Non-medical: Not on file  Tobacco Use  . Smoking status: Former Smoker    Types: Cigarettes  . Smokeless tobacco: Never Used  Substance and Sexual Activity  . Alcohol use: Yes    Alcohol/week: 0.0 standard drinks    Comment: very occassionally  . Drug use: No  . Sexual activity: Not Currently    Birth control/protection: Post-menopausal    Comment: TUBAL LIGATION  Lifestyle  . Physical activity:    Days per week: 3 days    Minutes per session:  30 min  . Stress: Not on file  Relationships  . Social connections:    Talks on phone: Not on file    Gets together: Not on file    Attends religious service: Not on file    Active member of club or organization: Not on file    Attends meetings of clubs or organizations: Not on file    Relationship status: Not on file  . Intimate partner violence:    Fear of current or ex partner: Not on file    Emotionally abused: Not on file    Physically abused: Not on file    Forced sexual activity: Not on file  Other Topics Concern  . Not on file  Social History Narrative   Marital status: widowed since 01/31/2015; Husband was a Podiatrist in 01/31/2015; died of esophageal cancer.      Children: 2 daughters, 1 son; no grandchildren      Lives: with 2 daughters, daughter's fiance.      Employment: homemaker     Tobacco; none      Alcohol: none      Exercise:  Walking or biking or yoga.       Seatbelt: 100%      Family History  Problem Relation Age of Onset  . Breast cancer Mother 37       breast cancer  . Cancer Mother 70       breast cancer  . Stroke Mother   . AVM Mother   . Diabetes Father   . Hypertension Father   . Heart disease Father 42       CABG/CAD  . Hyperlipidemia Father   . Diabetes Maternal Grandmother   . Colon cancer Neg Hx   . Stomach cancer Neg Hx   . Rectal cancer Neg Hx      Review of Systems    Constitutional: negative for anorexia, fevers and sweats  Eyes: negative for irritation, redness and visual disturbance  Ears, nose, mouth, throat, and face: negative for earaches, epistaxis, nasal congestion and sore throat  Respiratory: negative for cough, dyspnea on exertion, sputum and wheezing  Cardiovascular: negative for chest pain, dyspnea, lower extremity edema, orthopnea, palpitations and syncope  Gastrointestinal: negative for abdominal pain, constipation, diarrhea, melena, nausea and vomiting  Genitourinary:negative for dysuria, frequency and hematuria    Hematologic/lymphatic: negative for bleeding, easy bruising and lymphadenopathy  Musculoskeletal:negative for arthralgias, muscle weakness and stiff joints  Neurological: negative for coordination problems, gait problems, headaches and weakness  Endocrine: negative for diabetic symptoms including polydipsia, polyuria and weight loss      Objective:   Physical Exam  Gen. Pleasant, obese, in no distress, normal affect ENT - no pallor,icterus,  no post nasal drip, class 2-3 airway Neck: No JVD, no thyromegaly, no carotid bruits Lungs: no use of accessory muscles, no dullness to percussion, decreased without rales or rhonchi  Cardiovascular: Rhythm regular, heart sounds  normal, no murmurs or gallops, no peripheral edema Abdomen: soft and non-tender, no hepatosplenomegaly, BS normal. Musculoskeletal: No deformities, no cyanosis or clubbing Neuro:  alert, non focal, no tremors       Assessment & Plan:

## 2018-11-29 NOTE — Patient Instructions (Signed)
Schedule home sleep study. We discussed treatment options

## 2018-12-03 MED FILL — BENAZEPRIL HCL 10 MG TABLET: 10 | 90 days supply | Qty: 90 | Fill #1

## 2018-12-23 DIAGNOSIS — R0683 Snoring: Secondary | ICD-10-CM

## 2018-12-24 DIAGNOSIS — G4733 Obstructive sleep apnea (adult) (pediatric): Secondary | ICD-10-CM | POA: Diagnosis not present

## 2018-12-25 DIAGNOSIS — G4733 Obstructive sleep apnea (adult) (pediatric): Secondary | ICD-10-CM | POA: Diagnosis not present

## 2018-12-27 ENCOUNTER — Telehealth: Payer: Self-pay | Admitting: Pulmonary Disease

## 2018-12-27 DIAGNOSIS — G4733 Obstructive sleep apnea (adult) (pediatric): Secondary | ICD-10-CM

## 2018-12-27 NOTE — Telephone Encounter (Signed)
HST study completed on 12/24/2018 Pt had 69 apneas per hour with hypopneas causing severe oxygen desaturation Given severity of OSA, CPAP titration study should be scheduled Placed order for cpap titration today Called and spoke with patient, she verbalized understanding Nothing further needed at this time.

## 2019-01-05 ENCOUNTER — Telehealth: Payer: Self-pay | Admitting: Pulmonary Disease

## 2019-01-05 NOTE — Telephone Encounter (Signed)
-----   Message from Joellen Jersey sent at 01/05/2019 11:38 AM EST ----- Pt's insurance wants her to start on auto cpap 1st before doing the cpap titration study we need to initiate cpap[ 1st thanks libby

## 2019-01-05 NOTE — Telephone Encounter (Signed)
OK to initiate autoCPAP 5-15cm , mask of choice,  OV in 6 wks with NP/ me

## 2019-01-12 ENCOUNTER — Other Ambulatory Visit: Payer: Self-pay

## 2019-01-12 DIAGNOSIS — G4733 Obstructive sleep apnea (adult) (pediatric): Secondary | ICD-10-CM

## 2019-01-12 NOTE — Telephone Encounter (Signed)
RX has been sent. Patient is aware. Will close this message.

## 2019-01-20 ENCOUNTER — Telehealth: Payer: Self-pay | Admitting: Pulmonary Disease

## 2019-01-20 NOTE — Telephone Encounter (Signed)
spoke with [name] from [facility].  Faxed sleep study to [phone].  Nothing further is needed

## 2019-01-27 ENCOUNTER — Telehealth: Payer: Self-pay | Admitting: Pulmonary Disease

## 2019-01-27 ENCOUNTER — Other Ambulatory Visit: Payer: Self-pay | Admitting: Family Medicine

## 2019-01-27 DIAGNOSIS — G4733 Obstructive sleep apnea (adult) (pediatric): Secondary | ICD-10-CM | POA: Diagnosis not present

## 2019-01-27 MED FILL — BENAZEPRIL HCL 10 MG TABLET: 10 | 90 days supply | Qty: 90 | Fill #0

## 2019-01-27 MED FILL — AMLODIPINE BESYLATE 10 MG T: 10 | 90 days supply | Qty: 90 | Fill #2

## 2019-01-27 MED FILL — PANTOPRAZOLE SOD DR 20 MG T: 20 | 90 days supply | Qty: 90 | Fill #0

## 2019-01-27 NOTE — Telephone Encounter (Signed)
[name], please advise if you have received this form from [facility]? Thanks!

## 2019-01-27 NOTE — Telephone Encounter (Signed)
Last OV 10/15/2018 Last refill Pantoprazole 08/01/2018 #90/1                 Benazepril 09/08/2018 #90/1 Next OV 04/15/2019

## 2019-01-27 NOTE — Telephone Encounter (Signed)
This form was faxed to [facility] on 01/26/19. I refaxed it again this morning 01/27/19 and confirmed with Ivin Booty that she has the copy this time

## 2019-01-29 ENCOUNTER — Encounter (HOSPITAL_BASED_OUTPATIENT_CLINIC_OR_DEPARTMENT_OTHER): Payer: BLUE CROSS/BLUE SHIELD

## 2019-02-27 DIAGNOSIS — G4733 Obstructive sleep apnea (adult) (pediatric): Secondary | ICD-10-CM | POA: Diagnosis not present

## 2019-03-29 DIAGNOSIS — G4733 Obstructive sleep apnea (adult) (pediatric): Secondary | ICD-10-CM | POA: Diagnosis not present

## 2019-04-04 ENCOUNTER — Other Ambulatory Visit (INDEPENDENT_AMBULATORY_CARE_PROVIDER_SITE_OTHER): Payer: BLUE CROSS/BLUE SHIELD

## 2019-04-04 DIAGNOSIS — Z0000 Encounter for general adult medical examination without abnormal findings: Secondary | ICD-10-CM | POA: Diagnosis not present

## 2019-04-04 DIAGNOSIS — R7303 Prediabetes: Secondary | ICD-10-CM | POA: Diagnosis not present

## 2019-04-04 DIAGNOSIS — I10 Essential (primary) hypertension: Secondary | ICD-10-CM | POA: Diagnosis not present

## 2019-04-04 LAB — LIPID PANEL
Cholesterol: 178 mg/dL (ref 0–200)
HDL: 48.8 mg/dL (ref >39.00)
LDL Cholesterol: 107 mg/dL — ABNORMAL HIGH (ref 0–99)
NonHDL: 129.35
Total CHOL/HDL Ratio: 4
Triglycerides: 113 mg/dL (ref 0.0–149.0)
VLDL: 22.6 mg/dL (ref 0.0–40.0)

## 2019-04-04 LAB — HEMOGLOBIN A1C: Hgb A1c MFr Bld: 6.3 % (ref 4.6–6.5)

## 2019-04-04 LAB — CBC WITH DIFFERENTIAL/PLATELET
Basophils Absolute: 0 10*3/uL (ref 0.0–0.1)
Basophils Relative: 0.6 % (ref 0.0–3.0)
Eosinophils Absolute: 0.2 10*3/uL (ref 0.0–0.7)
Eosinophils Relative: 3.9 % (ref 0.0–5.0)
HCT: 42.1 % (ref 36.0–46.0)
Hemoglobin: 14.2 g/dL (ref 12.0–15.0)
Lymphocytes Relative: 28.8 % (ref 12.0–46.0)
Lymphs Abs: 1.6 10*3/uL (ref 0.7–4.0)
MCHC: 33.8 g/dL (ref 30.0–36.0)
MCV: 89.4 fl (ref 78.0–100.0)
Monocytes Absolute: 0.5 10*3/uL (ref 0.1–1.0)
Monocytes Relative: 8.5 % (ref 3.0–12.0)
Neutro Abs: 3.3 10*3/uL (ref 1.4–7.7)
Neutrophils Relative %: 58.2 % (ref 43.0–77.0)
Platelets: 652 10*3/uL — ABNORMAL HIGH (ref 150.0–400.0)
RBC: 4.71 Mil/uL (ref 3.87–5.11)
RDW: 14.1 % (ref 11.5–15.5)
WBC: 5.7 10*3/uL (ref 4.0–10.5)

## 2019-04-04 LAB — COMPREHENSIVE METABOLIC PANEL
ALT: 21 U/L (ref 0–35)
AST: 16 U/L (ref 0–37)
Albumin: 4.5 g/dL (ref 3.5–5.2)
Alkaline Phosphatase: 64 U/L (ref 39–117)
BUN: 15 mg/dL (ref 6–23)
CO2: 27 mEq/L (ref 19–32)
Calcium: 9.7 mg/dL (ref 8.4–10.5)
Chloride: 103 mEq/L (ref 96–112)
Creatinine, Ser: 0.71 mg/dL (ref 0.40–1.20)
GFR: 85.42 mL/min (ref >60.00)
Glucose, Bld: 113 mg/dL — ABNORMAL HIGH (ref 70–99)
Potassium: 4.6 mEq/L (ref 3.5–5.1)
Sodium: 140 mEq/L (ref 135–145)
Total Bilirubin: 0.5 mg/dL (ref 0.2–1.2)
Total Protein: 7.4 g/dL (ref 6.0–8.3)

## 2019-04-04 LAB — VITAMIN D 25 HYDROXY (VIT D DEFICIENCY, FRACTURES): VITD: 29.49 ng/mL — ABNORMAL LOW (ref 30.00–100.00)

## 2019-04-04 LAB — MICROALBUMIN / CREATININE URINE RATIO
Creatinine,U: 119.9 mg/dL
Microalb Creat Ratio: 1.5 mg/g (ref 0.0–30.0)
Microalb, Ur: 1.8 mg/dL (ref 0.0–1.9)

## 2019-04-04 LAB — TSH: TSH: 3.96 u[IU]/mL (ref 0.35–4.50)

## 2019-04-08 ENCOUNTER — Other Ambulatory Visit: Payer: Self-pay

## 2019-04-08 ENCOUNTER — Ambulatory Visit: Payer: BC Managed Care – PPO | Admitting: Family Medicine

## 2019-04-08 ENCOUNTER — Encounter: Payer: Self-pay | Admitting: Family Medicine

## 2019-04-08 VITALS — BP 130/80 | HR 72 | Temp 97.5°F | Ht 65.5 in | Wt 232.4 lb

## 2019-04-08 DIAGNOSIS — Z0000 Encounter for general adult medical examination without abnormal findings: Secondary | ICD-10-CM | POA: Diagnosis not present

## 2019-04-08 DIAGNOSIS — R7303 Prediabetes: Secondary | ICD-10-CM

## 2019-04-08 DIAGNOSIS — M25561 Pain in right knee: Secondary | ICD-10-CM | POA: Diagnosis not present

## 2019-04-08 DIAGNOSIS — I10 Essential (primary) hypertension: Secondary | ICD-10-CM | POA: Diagnosis not present

## 2019-04-08 DIAGNOSIS — M25562 Pain in left knee: Secondary | ICD-10-CM

## 2019-04-08 DIAGNOSIS — G8929 Other chronic pain: Secondary | ICD-10-CM

## 2019-04-08 MED ORDER — BENAZEPRIL HCL 10 MG PO TABS: 10.0000 mg | ORAL_TABLET | Freq: Every day | ORAL | 3 refills | Status: DC

## 2019-04-08 MED ORDER — DICLOFENAC SODIUM 1 % TD GEL: 4.0000 g | Freq: Four times a day (QID) | TRANSDERMAL | 0 refills | Status: DC

## 2019-04-08 MED ORDER — PANTOPRAZOLE SODIUM 20 MG PO TBEC: 20.0000 mg | DELAYED_RELEASE_TABLET | Freq: Every day | ORAL | 3 refills | Status: DC

## 2019-04-08 MED FILL — BENAZEPRIL HCL 10 MG TABLET: 10 | 90 days supply | Qty: 90 | Fill #0

## 2019-04-08 MED FILL — PANTOPRAZOLE SOD DR 20 MG T: 20 | 90 days supply | Qty: 90 | Fill #0

## 2019-04-08 NOTE — Progress Notes (Deleted)
Patient: [name] MRN: [id number] DOB: 1966-01-21 PCP: Orma Flaming, MD     Subjective:  No chief complaint on file.   HPI: The patient is a 53 y.o. female who presents today for annual exam. {He/she (caps):30048} denies any changes to past medical history. There have been no recent hospitalizations. They {Actions; are/are not:16769} following a well balanced diet and exercise plan. Weight has been {trend:16658}. No complaints today.   Immunization History  Administered Date(s) Administered  . Influenza Split 09/17/2011  . Influenza Whole 09/18/2008, 09/17/2009  . Influenza, Seasonal, Injecte, Preservative Fre 10/04/2012  . Influenza,inj,Quad PF,6+ Mos 09/07/2014, 09/10/2015, 10/09/2016, 06/30/2017, 10/15/2018  . Pneumococcal Polysaccharide-23 03/19/2009  . Td 09/13/2007  . Tdap 12/28/2017   Colonoscopy: Mammogram:  Pap smear:  PSA:   Review of Systems  Allergies Patient has No Known Allergies.  Past Medical History Patient  has a past medical history of Anemia, Arthritis, Diverticulosis of colon (without mention of hemorrhage), FIBROIDS, UTERUS, GERD, HYPERTENSION, NSVD (normal spontaneous vaginal delivery), Obesity, unspecified, Osteopenia (11/2017), Other abnormal glucose, and THROMBOCYTHEMIA.  Surgical History Patient  has a past surgical history that includes Knee arthroscopy (Right, 2006); RESECTOSCOPIC POLYPECTOMY; Hysteroscopy (06/19/2005); Colonoscopy; Tubal ligation (2006); Uterine fibroid surgery; Total knee arthroplasty (Right, 10/10/2015); Diagnostic laparoscopy; Laparoscopic bilateral salpingo oophorectomy (Bilateral, 03/04/2016); and Lysis of adhesion (N/A, 03/04/2016).  Family History Pateint's family history includes AVM in her mother; Breast cancer (age of onset: 62) in her mother; Cancer (age of onset: 70) in her mother; Diabetes in her father and maternal grandmother; Heart disease (age of onset: 58) in her father; Hyperlipidemia in her father;  Hypertension in her father; Stroke in her mother.  Social History Patient  reports that she has quit smoking. Her smoking use included cigarettes. She has never used smokeless tobacco. She reports current alcohol use. She reports that she does not use drugs.    Objective: There were no vitals filed for this visit.  There is no height or weight on file to calculate BMI.  Physical Exam     Assessment/plan:   No problem-specific Assessment & Plan notes found for this encounter.    No follow-ups on file.     Orma Flaming, MD [facility]  04/08/2019

## 2019-04-08 NOTE — Patient Instructions (Addendum)
Baker Cyst A Baker cyst, also called a popliteal cyst, is a sac-like growth that forms at the back of the knee. The cyst forms when the fluid-filled sac (bursa) that cushions the knee joint becomes enlarged. The bursa that becomes a Baker cyst is located at the back of the knee joint. What are the causes? In most cases, a Baker cyst results from another knee problem that causes swelling inside the knee. This makes the fluid inside the knee joint (synovial fluid) flow into the bursa behind the knee, causing the bursa to enlarge. What increases the risk? You may be more likely to develop a Baker cyst if you already have a knee problem, such as:  A tear in cartilage that cushions the knee joint (meniscal tear).  A tear in the tissues that connect the bones of the knee joint (ligament tear).  Knee swelling from osteoarthritis, rheumatoid arthritis, or gout. What are the signs or symptoms? A Baker cyst does not always cause symptoms. A lump behind the knee may be the only sign of the condition. The lump may be painful, especially when the knee is straightened. If the lump is painful, the pain may come and go. The knee may also be stiff. Symptoms may quickly get more severe if the cyst breaks open (ruptures). If your cyst ruptures, signs and symptoms may affect the knee and the back of the lower leg (calf) and may include:  Sudden or worsening pain.  Swelling.  Bruising. How is this diagnosed? This condition may be diagnosed based on your symptoms and medical history. Your health care provider will also do a physical exam. This may include:  Feeling the cyst to check whether it is tender.  Checking your knee for signs of another knee condition that causes swelling. You may have imaging tests, such as:  X-rays.  MRI.  Ultrasound. How is this treated? A Baker cyst that is not painful may go away without treatment. If the cyst gets large or painful, it will likely get better if the  underlying knee problem is treated. Treatment for a Baker cyst may include:  Resting.  Keeping weight off of the knee. This means not leaning on the knee to support your body weight.  NSAIDs to reduce pain and swelling.  A procedure to drain the fluid from the cyst with a needle (aspiration). You may also get an injection of a medicine that reduces swelling (steroid).  Surgery. This may be needed if other treatments do not work. This usually involves correcting knee damage and removing the cyst. Follow these instructions at home:   Take over-the-counter and prescription medicines only as told by your health care provider.  Rest and return to your normal activities as told by your health care provider. Avoid activities that make pain or swelling worse. Ask your health care provider what activities are safe for you.  Keep all follow-up visits as told by your health care provider. This is important. Contact a health care provider if:  You have knee pain, stiffness, or swelling that does not get better. Get help right away if:  You have sudden or worsening pain and swelling in your calf area. This information is not intended to replace advice given to you by your health care provider. Make sure you discuss any questions you have with your health care provider. Document Released: 10/20/2005 Document Revised: 07/10/2016 Document Reviewed: 07/10/2016 Elsevier Interactive Patient Education  2019 [place] Years, Female Preventive care  refers to lifestyle choices and visits with your health care provider that can promote health and wellness. What does preventive care include?   A yearly physical exam. This is also called an annual well check.  Dental exams once or twice a year.  Routine eye exams. Ask your health care provider how often you should have your eyes checked.  Personal lifestyle choices, including: ? Daily care of your teeth and gums. ?  Regular physical activity. ? Eating a healthy diet. ? Avoiding tobacco and drug use. ? Limiting alcohol use. ? Practicing safe sex. ? Taking low-dose aspirin daily starting at age 77. ? Taking vitamin and mineral supplements as recommended by your health care provider. What happens during an annual well check? The services and screenings done by your health care provider during your annual well check will depend on your age, overall health, lifestyle risk factors, and family history of disease. Counseling Your health care provider may ask you questions about your:  Alcohol use.  Tobacco use.  Drug use.  Emotional well-being.  Home and relationship well-being.  Sexual activity.  Eating habits.  Work and work Statistician.  Method of birth control.  Menstrual cycle.  Pregnancy history. Screening You may have the following tests or measurements:  Height, weight, and BMI.  Blood pressure.  Lipid and cholesterol levels. These may be checked every 5 years, or more frequently if you are over 48 years old.  Skin check.  Lung cancer screening. You may have this screening every year starting at age 12 if you have a 30-pack-year history of smoking and currently smoke or have quit within the past 15 years.  Colorectal cancer screening. All adults should have this screening starting at age 81 and continuing until age 28. Your health care provider may recommend screening at age 62. You will have tests every 1-10 years, depending on your results and the type of screening test. People at increased risk should start screening at an earlier age. Screening tests may include: ? Guaiac-based fecal occult blood testing. ? Fecal immunochemical test (FIT). ? Stool DNA test. ? Virtual colonoscopy. ? Sigmoidoscopy. During this test, a flexible tube with a tiny camera (sigmoidoscope) is used to examine your rectum and lower colon. The sigmoidoscope is inserted through your anus into your  rectum and lower colon. ? Colonoscopy. During this test, a long, thin, flexible tube with a tiny camera (colonoscope) is used to examine your entire colon and rectum.  Hepatitis C blood test.  Hepatitis B blood test.  Sexually transmitted disease (STD) testing.  Diabetes screening. This is done by checking your blood sugar (glucose) after you have not eaten for a while (fasting). You may have this done every 1-3 years.  Mammogram. This may be done every 1-2 years. Talk to your health care provider about when you should start having regular mammograms. This may depend on whether you have a family history of breast cancer.  BRCA-related cancer screening. This may be done if you have a family history of breast, ovarian, tubal, or peritoneal cancers.  Pelvic exam and Pap test. This may be done every 3 years starting at age 75. Starting at age 23, this may be done every 5 years if you have a Pap test in combination with an HPV test.  Bone density scan. This is done to screen for osteoporosis. You may have this scan if you are at high risk for osteoporosis. Discuss your test results, treatment options, and if necessary, the need for  more tests with your health care provider. Vaccines Your health care provider may recommend certain vaccines, such as:  Influenza vaccine. This is recommended every year.  Tetanus, diphtheria, and acellular pertussis (Tdap, Td) vaccine. You may need a Td booster every 10 years.  Varicella vaccine. You may need this if you have not been vaccinated.  Zoster vaccine. You may need this after age 16.  Measles, mumps, and rubella (MMR) vaccine. You may need at least one dose of MMR if you were born in 1957 or later. You may also need a second dose.  Pneumococcal 13-valent conjugate (PCV13) vaccine. You may need this if you have certain conditions and were not previously vaccinated.  Pneumococcal polysaccharide (PPSV23) vaccine. You may need one or two doses if you  smoke cigarettes or if you have certain conditions.  Meningococcal vaccine. You may need this if you have certain conditions.  Hepatitis A vaccine. You may need this if you have certain conditions or if you travel or work in places where you may be exposed to hepatitis A.  Hepatitis B vaccine. You may need this if you have certain conditions or if you travel or work in places where you may be exposed to hepatitis B.  Haemophilus influenzae type b (Hib) vaccine. You may need this if you have certain conditions. Talk to your health care provider about which screenings and vaccines you need and how often you need them. This information is not intended to replace advice given to you by your health care provider. Make sure you discuss any questions you have with your health care provider. Document Released: 11/16/2015 Document Revised: 12/10/2017 Document Reviewed: 08/21/2015 Elsevier Interactive Patient Education  2019 [employer].

## 2019-04-08 NOTE — Progress Notes (Signed)
Patient: [name] MRN: [id number] DOB: 1966-09-27 PCP: Orma Flaming, MD     Subjective:  Chief Complaint  Patient presents with  . Annual Exam  . Hypertension  . Blood Sugar Problem    prediabetes    HPI: The patient is a 53 y.o. female who presents today for annual exam. She denies any changes to past medical history. There have been no recent hospitalizations. They are not following a well balanced diet and exercise plan. Weight has been stable. Has complaints of bilateral knee pain today.   Prediabetes: a1c done and is 6.3. Was 6.1 eight months ago. She was going to work on diet/exercise. She gained weight as well. She has gained about 7 pounds. She is really trying to get back into the swing of things.   Hypertension: Hypertension: Here for follow up of hypertension.  Currently on norvasc 10mg  and lotensin 10mg  .  Takes medication as prescribed and denies any side effects. Exercise includes biking, walking. Weight has been stable. Denies any chest pain, headaches, shortness of breath, vision changes, swelling in lower extremities.   The 10-year ASCVD risk score Mikey Bussing DC Brooke Bonito., et al., 2013) is: 5.2%   Values used to calculate the score:     Age: 53 years     Sex: Female     Is Non-Hispanic African American: No     Diabetic: Yes     Tobacco smoker: No     Systolic Blood Pressure: 175 mmHg     Is BP treated: Yes     HDL Cholesterol: 48.8 mg/dL     Total Cholesterol: 178 mg/dL  bilateral knee pain: She has an artificial knee in her right knee that was done in 2016 for OA and started to have pain recently.  She can't fully flex knee to 90 degrees and is having pain going up and down stairs. No swelling/erythema or warmth.  Her left knee also started hurting after she tripped on stairs in October. She was going down stairs, but caught her self. Her left knee has hurt in the back since that time. Sitting for a while hurts and it takes her a second to get moving. No swelling,  redness. Pain rated as a 6/10 when she stands up. Non weight bearing helps. She can walk a mile with no pain. She has not taken anything over the counter. Known OA in this knee as well.   Immunization History  Administered Date(s) Administered  . Influenza Split 09/17/2011  . Influenza Whole 09/18/2008, 09/17/2009  . Influenza, Seasonal, Injecte, Preservative Fre 10/04/2012  . Influenza,inj,Quad PF,6+ Mos 09/07/2014, 09/10/2015, 10/09/2016, 06/30/2017, 10/15/2018  . Pneumococcal Polysaccharide-23 03/19/2009  . Td 09/13/2007  . Tdap 12/28/2017   Colonoscopy: 08/2013 Mammogram: 10/07/2018 Pap smear: 10/2018   Review of Systems  Constitutional: Negative for chills, fatigue and fever.  HENT: Negative for dental problem, ear pain, hearing loss and trouble swallowing.   Eyes: Negative for visual disturbance.  Respiratory: Negative for cough, chest tightness and shortness of breath.   Cardiovascular: Negative for chest pain, palpitations and leg swelling.  Gastrointestinal: Negative for abdominal pain, blood in stool, diarrhea and nausea.  Endocrine: Negative for cold intolerance, polydipsia, polyphagia and polyuria.  Genitourinary: Negative for dysuria and hematuria.  Musculoskeletal: Positive for arthralgias (bilteral knees ). Negative for back pain and neck pain.  Skin: Negative for rash.  Neurological: Negative for dizziness and headaches.  Psychiatric/Behavioral: Negative for dysphoric mood and sleep disturbance. The patient is not nervous/anxious.  Allergies Patient has No Known Allergies.  Past Medical History Patient  has a past medical history of Anemia, Arthritis, Diverticulosis of colon (without mention of hemorrhage), FIBROIDS, UTERUS, GERD, HYPERTENSION, NSVD (normal spontaneous vaginal delivery), Obesity, unspecified, Osteopenia (11/2017), Other abnormal glucose, and THROMBOCYTHEMIA.  Surgical History Patient  has a past surgical history that includes Knee arthroscopy  (Right, 2006); RESECTOSCOPIC POLYPECTOMY; Hysteroscopy (06/19/2005); Colonoscopy; Tubal ligation (2006); Uterine fibroid surgery; Total knee arthroplasty (Right, 10/10/2015); Diagnostic laparoscopy; Laparoscopic bilateral salpingo oophorectomy (Bilateral, 03/04/2016); and Lysis of adhesion (N/A, 03/04/2016).  Family History Pateint's family history includes AVM in her mother; Breast cancer (age of onset: 18) in her mother; Cancer (age of onset: 70) in her mother; Diabetes in her father and maternal grandmother; Heart disease (age of onset: 27) in her father; Hyperlipidemia in her father; Hypertension in her father; Stroke in her mother.  Social History Patient  reports that she has quit smoking. Her smoking use included cigarettes. She has never used smokeless tobacco. She reports current alcohol use. She reports that she does not use drugs.    Objective: Vitals:   04/08/19 1313  BP: 130/80  Pulse: 72  Temp: (!) 97.5 F (36.4 C)  TempSrc: Oral  SpO2: 97%  Weight: 232 lb 6.4 oz (105.4 kg)  Height: 5' 5.5" (1.664 m)    Body mass index is 38.09 kg/m.  Physical Exam Vitals signs reviewed.  Constitutional:      Appearance: She is well-developed. She is obese.  HENT:     Head: Normocephalic and atraumatic.     Right Ear: Tympanic membrane, ear canal and external ear normal.     Left Ear: Tympanic membrane, ear canal and external ear normal.     Nose: Nose normal.     Mouth/Throat:     Mouth: Mucous membranes are moist.  Eyes:     Extraocular Movements: Extraocular movements intact.     Conjunctiva/sclera: Conjunctivae normal.     Pupils: Pupils are equal, round, and reactive to light.  Neck:     Musculoskeletal: Normal range of motion and neck supple.     Thyroid: No thyromegaly.  Cardiovascular:     Rate and Rhythm: Normal rate and regular rhythm.     Pulses: Normal pulses.     Heart sounds: Normal heart sounds. No murmur.  Pulmonary:     Effort: Pulmonary effort is normal.      Breath sounds: Normal breath sounds.  Abdominal:     General: Abdomen is flat. Bowel sounds are normal. There is no distension.     Palpations: Abdomen is soft.     Tenderness: There is no abdominal tenderness.  Musculoskeletal:        General: No swelling.     Comments: Right knee: can not flex past 90 degrees. Extension intact. Negative draw signs. No edema/erythema.   Left knee: ? Bakers cyst. Pain in popliteal fossa, but no edema/warmth.   Lymphadenopathy:     Cervical: No cervical adenopathy.  Skin:    General: Skin is warm and dry.     Capillary Refill: Capillary refill takes less than 2 seconds.     Findings: No rash.  Neurological:     General: No focal deficit present.     Mental Status: She is alert and oriented to person, place, and time.     Cranial Nerves: No cranial nerve deficit.     Coordination: Coordination normal.     Deep Tendon Reflexes: Reflexes normal.  Psychiatric:  Mood and Affect: Mood normal.        Behavior: Behavior normal.        Assessment/plan: 1. Annual physical exam Routine labs done. HM utd. Really encouraged her to work on diet/exericse and weight loss. Chronic issues addressed below.  Patient counseling [x]    Nutrition: Stressed importance of moderation in sodium/caffeine intake, saturated fat and cholesterol, caloric balance, sufficient intake of fresh fruits, vegetables, fiber, calcium, iron, and 1 mg of folate supplement per day (for females capable of pregnancy).  [x]    Stressed the importance of regular exercise.   []    Substance Abuse: Discussed cessation/primary prevention of tobacco, alcohol, or other drug use; driving or other dangerous activities under the influence; availability of treatment for abuse.   [x]    Injury prevention: Discussed safety belts, safety helmets, smoke detector, smoking near bedding or upholstery.   [x]    Sexuality: Discussed sexually transmitted diseases, partner selection, use of condoms, avoidance of  unintended pregnancy  and contraceptive alternatives.  [x]    Dental health: Discussed importance of regular tooth brushing, flossing, and dental visits.  [x]    Health maintenance and immunizations reviewed. Please refer to Health maintenance section.     2. Prediabetes Has creeped up to 6.3. Discussed I would like to start her on medication, but she is really set on doing this with diet/weight loss and life style changes. Discussed she is so close to full blown diabetes, Id prefer to start low dose metformin, but she has apparently done this with life style changes in the past and knows she can do this. We will give her only 3 months to work on this number, but if continues to be the same or elevated, will need to start medication.  - Hemoglobin A1c; Future  3. Essential hypertension To goal. Continue medication. Refills given and labs wnl. She is working on weight loss and exercise. F/u in 12 months.   4. Acute pain of right knee Known OA s/p replacement in 2016. She wonders if she just needs therapy. She is going to go and see her ortho that did replacement. Will do trial of voltaren gel and see if this gives her some relief in the meantime. Will defer any xrays to him.   5. Chronic pain of left knee ? Bakers cyst. voltaren gel, elastic brace over knee, ice. Discussed can be treated conservatively, but will be seeing ortho and he can xray her as well to make sure not worsening OA contributing. She will make this appointment and doesn't feel like she needs a referral.     Return in about 3 months (around 07/09/2019) for lab only for a1c .     Orma Flaming, MD [facility]  04/08/2019

## 2019-04-15 ENCOUNTER — Ambulatory Visit: Payer: BLUE CROSS/BLUE SHIELD | Admitting: Family Medicine

## 2019-04-29 DIAGNOSIS — G4733 Obstructive sleep apnea (adult) (pediatric): Secondary | ICD-10-CM | POA: Diagnosis not present

## 2019-05-04 ENCOUNTER — Other Ambulatory Visit: Payer: Self-pay | Admitting: Obstetrics & Gynecology

## 2019-05-04 ENCOUNTER — Other Ambulatory Visit: Payer: Self-pay | Admitting: Family Medicine

## 2019-05-04 MED FILL — BENAZEPRIL HCL 10 MG TABLET: 10 | 90 days supply | Qty: 90 | Fill #0

## 2019-05-04 MED FILL — PANTOPRAZOLE SOD DR 20 MG T: 20 | 90 days supply | Qty: 90 | Fill #0

## 2019-05-04 MED FILL — AMLODIPINE BESYLATE 10 MG T: 10 | 90 days supply | Qty: 90 | Fill #3

## 2019-05-06 MED FILL — NYSTATIN-TRIAMCINOLONE CRM: 100000-0.1 | 15 days supply | Qty: 30 | Fill #0

## 2019-05-29 DIAGNOSIS — G4733 Obstructive sleep apnea (adult) (pediatric): Secondary | ICD-10-CM | POA: Diagnosis not present

## 2019-06-01 DIAGNOSIS — G4733 Obstructive sleep apnea (adult) (pediatric): Secondary | ICD-10-CM | POA: Diagnosis not present

## 2019-06-07 DIAGNOSIS — D2262 Melanocytic nevi of left upper limb, including shoulder: Secondary | ICD-10-CM | POA: Diagnosis not present

## 2019-06-07 DIAGNOSIS — L570 Actinic keratosis: Secondary | ICD-10-CM | POA: Diagnosis not present

## 2019-06-07 DIAGNOSIS — L814 Other melanin hyperpigmentation: Secondary | ICD-10-CM | POA: Diagnosis not present

## 2019-06-07 DIAGNOSIS — L821 Other seborrheic keratosis: Secondary | ICD-10-CM | POA: Diagnosis not present

## 2019-06-07 DIAGNOSIS — D2261 Melanocytic nevi of right upper limb, including shoulder: Secondary | ICD-10-CM | POA: Diagnosis not present

## 2019-06-24 DIAGNOSIS — M25561 Pain in right knee: Secondary | ICD-10-CM | POA: Diagnosis not present

## 2019-06-24 DIAGNOSIS — Z96651 Presence of right artificial knee joint: Secondary | ICD-10-CM | POA: Diagnosis not present

## 2019-06-24 DIAGNOSIS — M25562 Pain in left knee: Secondary | ICD-10-CM | POA: Diagnosis not present

## 2019-06-24 DIAGNOSIS — Z09 Encounter for follow-up examination after completed treatment for conditions other than malignant neoplasm: Secondary | ICD-10-CM | POA: Diagnosis not present

## 2019-06-24 MED FILL — MELOXICAM 15 MG TABLET: 15 | 30 days supply | Qty: 30 | Fill #0

## 2019-06-29 DIAGNOSIS — G4733 Obstructive sleep apnea (adult) (pediatric): Secondary | ICD-10-CM | POA: Diagnosis not present

## 2019-07-12 ENCOUNTER — Other Ambulatory Visit (INDEPENDENT_AMBULATORY_CARE_PROVIDER_SITE_OTHER): Payer: BC Managed Care – PPO

## 2019-07-12 ENCOUNTER — Other Ambulatory Visit: Payer: Self-pay | Admitting: Family Medicine

## 2019-07-12 ENCOUNTER — Other Ambulatory Visit: Payer: Self-pay

## 2019-07-12 DIAGNOSIS — R7303 Prediabetes: Secondary | ICD-10-CM

## 2019-07-12 LAB — HEMOGLOBIN A1C: Hgb A1c MFr Bld: 6.2 % (ref 4.6–6.5)

## 2019-07-27 ENCOUNTER — Encounter: Payer: Self-pay | Admitting: Gynecology

## 2019-07-30 DIAGNOSIS — G4733 Obstructive sleep apnea (adult) (pediatric): Secondary | ICD-10-CM | POA: Diagnosis not present

## 2019-08-09 ENCOUNTER — Other Ambulatory Visit: Payer: Self-pay | Admitting: Family Medicine

## 2019-08-09 MED FILL — PANTOPRAZOLE SOD DR 20 MG T: 20 | 90 days supply | Qty: 90 | Fill #1

## 2019-08-09 MED FILL — BENAZEPRIL HCL 10 MG TABLET: 10 | 90 days supply | Qty: 90 | Fill #1

## 2019-08-09 MED FILL — MELOXICAM 15 MG TABLET: 15 | 30 days supply | Qty: 30 | Fill #1

## 2019-08-10 MED FILL — AMLODIPINE BESYLATE 10 MG T: 10 | 90 days supply | Qty: 90 | Fill #0

## 2019-08-17 ENCOUNTER — Encounter: Payer: Self-pay | Admitting: Family Medicine

## 2019-08-17 ENCOUNTER — Ambulatory Visit (INDEPENDENT_AMBULATORY_CARE_PROVIDER_SITE_OTHER): Payer: BC Managed Care – PPO

## 2019-08-17 ENCOUNTER — Other Ambulatory Visit: Payer: Self-pay

## 2019-08-17 DIAGNOSIS — Z23 Encounter for immunization: Secondary | ICD-10-CM | POA: Diagnosis not present

## 2019-08-29 DIAGNOSIS — G4733 Obstructive sleep apnea (adult) (pediatric): Secondary | ICD-10-CM | POA: Diagnosis not present

## 2019-09-19 MED FILL — MELOXICAM 15 MG TABLET: 15 | 30 days supply | Qty: 30 | Fill #2

## 2019-09-29 DIAGNOSIS — G4733 Obstructive sleep apnea (adult) (pediatric): Secondary | ICD-10-CM | POA: Diagnosis not present

## 2019-10-13 DIAGNOSIS — Z1231 Encounter for screening mammogram for malignant neoplasm of breast: Secondary | ICD-10-CM | POA: Diagnosis not present

## 2019-10-13 DIAGNOSIS — Z803 Family history of malignant neoplasm of breast: Secondary | ICD-10-CM | POA: Diagnosis not present

## 2019-10-13 LAB — HM MAMMOGRAPHY

## 2019-10-29 DIAGNOSIS — G4733 Obstructive sleep apnea (adult) (pediatric): Secondary | ICD-10-CM | POA: Diagnosis not present

## 2019-11-14 MED FILL — AMLODIPINE BESYLATE 10 MG T: 10 | 90 days supply | Qty: 90 | Fill #1

## 2019-11-14 MED FILL — PANTOPRAZOLE SOD DR 20 MG T: 20 | 90 days supply | Qty: 90 | Fill #2

## 2019-11-14 MED FILL — BENAZEPRIL HCL 10 MG TABLET: 10 | 90 days supply | Qty: 90 | Fill #2

## 2019-11-24 ENCOUNTER — Encounter: Payer: BLUE CROSS/BLUE SHIELD | Admitting: Obstetrics & Gynecology

## 2019-11-29 DIAGNOSIS — G4733 Obstructive sleep apnea (adult) (pediatric): Secondary | ICD-10-CM | POA: Diagnosis not present

## 2019-11-30 ENCOUNTER — Ambulatory Visit (INDEPENDENT_AMBULATORY_CARE_PROVIDER_SITE_OTHER): Payer: BC Managed Care – PPO | Admitting: Obstetrics & Gynecology

## 2019-11-30 ENCOUNTER — Encounter: Payer: Self-pay | Admitting: Obstetrics & Gynecology

## 2019-11-30 ENCOUNTER — Other Ambulatory Visit: Payer: Self-pay

## 2019-11-30 VITALS — BP 130/84 | Ht 65.0 in | Wt 229.0 lb

## 2019-11-30 DIAGNOSIS — Z6838 Body mass index (BMI) 38.0-38.9, adult: Secondary | ICD-10-CM

## 2019-11-30 DIAGNOSIS — Z01419 Encounter for gynecological examination (general) (routine) without abnormal findings: Secondary | ICD-10-CM

## 2019-11-30 DIAGNOSIS — Z780 Asymptomatic menopausal state: Secondary | ICD-10-CM

## 2019-11-30 DIAGNOSIS — M85851 Other specified disorders of bone density and structure, right thigh: Secondary | ICD-10-CM

## 2019-11-30 DIAGNOSIS — M8588 Other specified disorders of bone density and structure, other site: Secondary | ICD-10-CM | POA: Diagnosis not present

## 2019-11-30 NOTE — Progress Notes (Signed)
[name] 10-20-1966 [id number]   History:    54 y.o. G3P3L3 Widowed x 4 1/2 yrs  RP:  Established patient presenting for annual gyn exam   HPI: Surgical menopause post BSO, well on no hormone replacement therapy.  No postmenopausal bleeding.  No pelvic pain.  Abstinent.  Urine and bowel movements normal.  Breasts normal.  Mother Dxed with Breast Ca at age 71.  Body mass index 36.71 last year, now increased to 38.11.  Planning to increase physical activity and start back on Nutrisystem.  Health labs with family physician.   Past medical history,surgical history, family history and social history were all reviewed and documented in the EPIC chart.  Gynecologic History Patient's last menstrual period was 12/24/2015 (lmp unknown).  Obstetric History OB History  Gravida Para Term Preterm AB Living  3 3 3     3   SAB TAB Ectopic Multiple Live Births          3    # Outcome Date GA Lbr Len/2nd Weight Sex Delivery Anes PTL Lv  3 Term     M Vag-Spont  N LIV  2 Term     F Vag-Spont  N LIV  1 Term     F Vag-Spont  N LIV     ROS: A ROS was performed and pertinent positives and negatives are included in the history.  GENERAL: No fevers or chills. HEENT: No change in vision, no earache, sore throat or sinus congestion. NECK: No pain or stiffness. CARDIOVASCULAR: No chest pain or pressure. No palpitations. PULMONARY: No shortness of breath, cough or wheeze. GASTROINTESTINAL: No abdominal pain, nausea, vomiting or diarrhea, melena or bright red blood per rectum. GENITOURINARY: No urinary frequency, urgency, hesitancy or dysuria. MUSCULOSKELETAL: No joint or muscle pain, no back pain, no recent trauma. DERMATOLOGIC: No rash, no itching, no lesions. ENDOCRINE: No polyuria, polydipsia, no heat or cold intolerance. No recent change in weight. HEMATOLOGICAL: No anemia or easy bruising or bleeding. NEUROLOGIC: No headache, seizures, numbness, tingling or weakness. PSYCHIATRIC: No depression, no  loss of interest in normal activity or change in sleep pattern.     Exam:   Ht 5\' 5"  (1.651 m)   Wt 229 lb (103.9 kg)   LMP 12/24/2015 (LMP Unknown)   BMI 38.11 kg/m   Body mass index is 38.11 kg/m.  General appearance : Well developed well nourished female. No acute distress HEENT: Eyes: no retinal hemorrhage or exudates,  Neck supple, trachea midline, no carotid bruits, no thyroidmegaly Lungs: Clear to auscultation, no rhonchi or wheezes, or rib retractions  Heart: Regular rate and rhythm, no murmurs or gallops Breast:Examined in sitting and supine position were symmetrical in appearance, no palpable masses or tenderness,  no skin retraction, no nipple inversion, no nipple discharge, no skin discoloration, no axillary or supraclavicular lymphadenopathy Abdomen: no palpable masses or tenderness, no rebound or guarding Extremities: no edema or skin discoloration or tenderness  Pelvic: Vulva: Normal             Vagina: No gross lesions or discharge  Cervix: No gross lesions or discharge  Uterus  AV, normal size, shape and consistency, non-tender and mobile  Adnexa  Without masses or tenderness  Anus: Normal   Assessment/Plan:  54 y.o. female for annual exam   1. Well female exam with routine gynecological exam Normal gynecologic exam in menopause.  Pap 10/2018 Negative, no indication to repeat this year.  Breasts normal. Screening mammo Negative 10/2019.  Colono 04/2016.  Health Labs with Fam MD.  2. Postmenopausal Well on no HRT.  No PMB.  3. Osteopenia of neck of right femur Very mild osteopenia at the Rt Femoral Neck with T-Score of -1.1 on BD 11/2017.  Repeat a BD at 3-5 yrs.  Vit D supplement, Ca++ 1200 mg daily and regular weightbearing physical activity.  4. Class 2 severe obesity due to excess calories with serious comorbidity and body mass index (BMI) of 38.0 to 38.9 in adult [facility]) Low calorie/carb diet such as [employer].  Aerobic physical activities 5 times a  week and light weightlifting every 2 days.  Other orders - meloxicam (MOBIC) 15 MG tablet; Take 15 mg by mouth daily. - VITAMIN D PO; Take by mouth. - Turmeric (QC TUMERIC COMPLEX) 500 MG CAPS; Take by mouth.  Princess Bruins MD, 12:26 PM 11/30/2019

## 2019-11-30 NOTE — Patient Instructions (Signed)
1. Well female exam with routine gynecological exam Normal gynecologic exam in menopause.  Pap 10/2018 Negative, no indication to repeat this year.  Breasts normal. Screening mammo Negative 10/2019.  Colono 04/2016.  Health Labs with Fam MD.  2. Postmenopausal Well on no HRT.  No PMB.  3. Osteopenia of neck of right femur Very mild osteopenia at the Rt Femoral Neck with T-Score of -1.1 on BD 11/2017.  Repeat a BD at 3-5 yrs.  Vit D supplement, Ca++ 1200 mg daily and regular weightbearing physical activity.  4. Class 2 severe obesity due to excess calories with serious comorbidity and body mass index (BMI) of 38.0 to 38.9 in adult [facility]) Low calorie/carb diet such as [employer].  Aerobic physical activities 5 times a week and light weightlifting every 2 days.  Other orders - meloxicam (MOBIC) 15 MG tablet; Take 15 mg by mouth daily. - VITAMIN D PO; Take by mouth. - Turmeric (QC TUMERIC COMPLEX) 500 MG CAPS; Take by mouth.  [name], it was a pleasure seeing you today!

## 2019-12-01 DIAGNOSIS — G4733 Obstructive sleep apnea (adult) (pediatric): Secondary | ICD-10-CM | POA: Diagnosis not present

## 2019-12-05 ENCOUNTER — Telehealth: Payer: Self-pay | Admitting: *Deleted

## 2019-12-05 DIAGNOSIS — G4733 Obstructive sleep apnea (adult) (pediatric): Secondary | ICD-10-CM | POA: Diagnosis not present

## 2019-12-05 MED FILL — MELOXICAM 15 MG TABLET: 15 | 30 days supply | Qty: 30 | Fill #0

## 2019-12-05 NOTE — Telephone Encounter (Signed)
Patient called and left message about a powder Rx that should have went to the pharmacy from office visit on 11/30/19. I called and left a message for her to call.

## 2019-12-06 MED ORDER — NYSTATIN 100000 UNIT/GM EX POWD: 1.0000 "application " | Freq: Two times a day (BID) | CUTANEOUS | 3 refills | Status: DC

## 2019-12-06 MED FILL — NYSTATIN 100000 UNIT/GM POW: 100000 | 10 days supply | Qty: 15 | Fill #0

## 2019-12-06 NOTE — Telephone Encounter (Signed)
Please send Nystatin powder BID as needed.  Refill x 3.

## 2019-12-06 NOTE — Telephone Encounter (Signed)
Dr.Lavoie patient called back states you mentioned to her a powder she could use for rash under her stomach. Has used cream in past, but mentioned at [place] on 11/30/19 that cream works sometimes. Please advise

## 2019-12-06 NOTE — Telephone Encounter (Signed)
Patient informed, Rx sent.  

## 2019-12-16 MED FILL — NYSTATIN-TRIAMCINOLONE CRM: 100000-0.1 | 15 days supply | Qty: 30 | Fill #1

## 2020-01-12 DIAGNOSIS — Z20828 Contact with and (suspected) exposure to other viral communicable diseases: Secondary | ICD-10-CM | POA: Diagnosis not present

## 2020-01-12 DIAGNOSIS — Z03818 Encounter for observation for suspected exposure to other biological agents ruled out: Secondary | ICD-10-CM | POA: Diagnosis not present

## 2020-02-03 MED FILL — PANTOPRAZOLE SOD DR 20 MG T: 20 | 90 days supply | Qty: 90 | Fill #3

## 2020-02-03 MED FILL — AMLODIPINE BESYLATE 10 MG T: 10 | 90 days supply | Qty: 90 | Fill #2

## 2020-03-05 MED FILL — BENAZEPRIL HCL 10 MG TABLET: 10 | 90 days supply | Qty: 90 | Fill #3

## 2020-03-22 ENCOUNTER — Telehealth: Payer: Self-pay | Admitting: Pulmonary Disease

## 2020-03-22 NOTE — Telephone Encounter (Signed)
Pt has not been seen in office for an appt since 11/29/18. Pt will need to have an appt scheduled.  Called and spoke with pt stating to her that we needed to get her scheduled for an appt and she verbalized understanding. appt scheduled for pt next Friday 5/28 with Aaron Edelman. Nothing further needed.

## 2020-03-30 ENCOUNTER — Encounter: Payer: Self-pay | Admitting: Pulmonary Disease

## 2020-03-30 ENCOUNTER — Ambulatory Visit: Payer: BC Managed Care – PPO | Admitting: Pulmonary Disease

## 2020-03-30 ENCOUNTER — Other Ambulatory Visit: Payer: Self-pay

## 2020-03-30 VITALS — BP 130/80 | HR 80 | Temp 98.3°F | Ht 66.0 in | Wt 225.6 lb

## 2020-03-30 DIAGNOSIS — G4733 Obstructive sleep apnea (adult) (pediatric): Secondary | ICD-10-CM

## 2020-03-30 DIAGNOSIS — Z6836 Body mass index (BMI) 36.0-36.9, adult: Secondary | ICD-10-CM | POA: Diagnosis not present

## 2020-03-30 NOTE — Assessment & Plan Note (Signed)
BMI 36.4  Plan: Continue to work on reducing BMI Work with primary care on weight loss strategies such as low carbohydrate diet, increasing physical activity

## 2020-03-30 NOTE — Progress Notes (Signed)
@Patient  ID: [name], female    DOB: 1966/02/12, 54 y.o.   MRN: [id number]  Chief Complaint  Patient presents with  . Follow-up    sleeping all night.  wakes up feeling rested.  some leaks from time to time.    Referring provider: Orma Flaming, MD  HPI:  54 year old female former smoker followed in our office for obstructive sleep apnea  PMH: Hypertension, GERD, prediabetes Smoker/ Smoking History: Former smoker Maintenance:  none Pt of: Dr. Elsworth Soho  03/30/2020  - Visit   54 year old female former smoker followed in our office for severe obstructive sleep apnea.  Completing 1 year follow-up with our office today.  Patient reports she is been doing well since getting set up with CPAP.  She admits it did take a few months for her to fully become comfortable using it.  CPAP compliance report shows excellent compliance as well as well-controlled apneas.  See report listed below:  02/28/2020-03/28/2020-29 and a last 30 days used, 29 of those days greater than 4 hours, average usage 7 hours and 15 minutes, AHI 1.2  Questionaires / Pulmonary Flowsheets:   ACT:  No flowsheet data found.  MMRC: No flowsheet data found.  Epworth:  No flowsheet data found.  Tests:   12/24/2018-home sleep study-AHI 69.4 an hour, SaO2 low 64%  FENO:  No results found for: NITRICOXIDE  PFT: No flowsheet data found.  WALK:  No flowsheet data found.  Imaging: No results found.  Lab Results:  CBC    Component Value Date/Time   WBC 5.7 04/04/2019 1010   RBC 4.71 04/04/2019 1010   HGB 14.2 04/04/2019 1010   HGB 15.0 12/22/2017 1127   HGB 14.1 07/14/2011 1108   HCT 42.1 04/04/2019 1010   HCT 45.1 12/22/2017 1127   HCT 41.7 07/14/2011 1108   PLT 652.0 (H) 04/04/2019 1010   PLT 690 (H) 12/22/2017 1127   MCV 89.4 04/04/2019 1010   MCV 92 12/22/2017 1127   MCV 87.7 07/14/2011 1108   MCH 30.4 12/22/2017 1127   MCH 28.0 10/09/2016 0848   MCHC 33.8 04/04/2019 1010   RDW 14.1  04/04/2019 1010   RDW 14.3 12/22/2017 1127   RDW 14.3 07/14/2011 1108   LYMPHSABS 1.6 04/04/2019 1010   LYMPHSABS 1.8 12/22/2017 1127   LYMPHSABS 1.9 07/14/2011 1108   MONOABS 0.5 04/04/2019 1010   MONOABS 0.6 07/14/2011 1108   EOSABS 0.2 04/04/2019 1010   EOSABS 0.3 12/22/2017 1127   BASOSABS 0.0 04/04/2019 1010   BASOSABS 0.0 12/22/2017 1127   BASOSABS 0.0 07/14/2011 1108    BMET    Component Value Date/Time   NA 140 04/04/2019 1010   NA 141 12/22/2017 1127   K 4.6 04/04/2019 1010   CL 103 04/04/2019 1010   CO2 27 04/04/2019 1010   GLUCOSE 113 (H) 04/04/2019 1010   BUN 15 04/04/2019 1010   BUN 11 12/22/2017 1127   CREATININE 0.71 04/04/2019 1010   CREATININE 0.96 10/09/2016 0848   CALCIUM 9.7 04/04/2019 1010   GFRNONAA 76 12/22/2017 1127   GFRAA 88 12/22/2017 1127    BNP No results found for: BNP  ProBNP No results found for: PROBNP  Specialty Problems      Pulmonary Problems   OSA (obstructive sleep apnea)    She is on cpap after sleep study.   12/24/2018-home sleep study-AHI 69.4 an hour, SaO2 low 64%          No Known Allergies  Immunization History  Administered Date(s)  Administered  . Influenza Split 09/17/2011  . Influenza Whole 09/18/2008, 09/17/2009  . Influenza, Seasonal, Injecte, Preservative Fre 10/04/2012  . Influenza,inj,Quad PF,6+ Mos 09/07/2014, 09/10/2015, 10/09/2016, 06/30/2017, 10/15/2018, 08/17/2019  . PFIZER SARS-COV-2 Vaccination 01/18/2020, 02/14/2020  . Pneumococcal Polysaccharide-23 03/19/2009  . Td 09/13/2007  . Tdap 12/28/2017    Past Medical History:  Diagnosis Date  . Anemia   . Arthritis   . Diverticulosis of colon (without mention of hemorrhage)   . FIBROIDS, UTERUS   . GERD   . HYPERTENSION   . NSVD (normal spontaneous vaginal delivery)    X3  . Obesity, unspecified   . Osteopenia 11/2017   T score -1.1 FRAX 4.8% / 0.2%  . Other abnormal glucose   . THROMBOCYTHEMIA     Tobacco History: Social History    Tobacco Use  Smoking Status Former Smoker  . Types: Cigarettes  Smokeless Tobacco Never Used   Counseling given: Yes   Continue to not smoke  Outpatient Encounter Medications as of 03/30/2020  Medication Sig  . amLODipine (NORVASC) 10 MG tablet TAKE 1 TABLET BY MOUTH ONCE DAILY  . aspirin 81 MG tablet Take 81 mg by mouth at bedtime.   . benazepril (LOTENSIN) 10 MG tablet Take 1 tablet (10 mg total) by mouth daily.  . diclofenac sodium (VOLTAREN) 1 % GEL Apply 4 g topically 4 (four) times daily.  Marland Kitchen loratadine (ALLERGY) 10 MG tablet Take 10 mg by mouth at bedtime.   . meloxicam (MOBIC) 15 MG tablet Take 15 mg by mouth daily.  Marland Kitchen nystatin (MYCOSTATIN/NYSTOP) powder Apply 1 application topically 2 (two) times daily. As needed  . nystatin-triamcinolone (MYCOLOG II) cream APPLY TO THE AFFECTED AREA(S) TWICE DAILY  . pantoprazole (PROTONIX) 20 MG tablet Take 1 tablet (20 mg total) by mouth daily.  . Turmeric (QC TUMERIC COMPLEX) 500 MG CAPS Take by mouth.  Marland Kitchen VITAMIN D PO Take by mouth.   No facility-administered encounter medications on file as of 03/30/2020.     Review of Systems  Review of Systems  Constitutional: Negative for activity change, fatigue and fever.  HENT: Negative for sinus pressure, sinus pain and sore throat.   Respiratory: Negative for cough, shortness of breath and wheezing.   Cardiovascular: Negative for chest pain and palpitations.  Gastrointestinal: Negative for diarrhea, nausea and vomiting.  Musculoskeletal: Negative for arthralgias.  Neurological: Negative for dizziness.  Psychiatric/Behavioral: Negative for sleep disturbance. The patient is not nervous/anxious.      Physical Exam  BP 130/80 (BP Location: Left Arm, Cuff Size: Large)   Pulse 80   Temp 98.3 F (36.8 C) (Oral)   Ht 5\' 6"  (1.676 m)   Wt 225 lb 9.6 oz (102.3 kg)   LMP 12/24/2015 (LMP Unknown)   SpO2 97%   BMI 36.41 kg/m   Wt Readings from Last 5 Encounters:  03/30/20 225 lb 9.6 oz  (102.3 kg)  11/30/19 229 lb (103.9 kg)  04/08/19 232 lb 6.4 oz (105.4 kg)  11/29/18 260 lb (117.9 kg)  10/18/18 224 lb (101.6 kg)    BMI Readings from Last 5 Encounters:  03/30/20 36.41 kg/m  11/30/19 38.11 kg/m  04/08/19 38.09 kg/m  11/29/18 42.61 kg/m  10/18/18 36.71 kg/m     Physical Exam Vitals and nursing note reviewed.  Constitutional:      General: She is not in acute distress.    Appearance: Normal appearance.  HENT:     Head: Normocephalic and atraumatic.     Right Ear: External  ear normal.     Left Ear: External ear normal.  Cardiovascular:     Rate and Rhythm: Normal rate and regular rhythm.     Pulses: Normal pulses.     Heart sounds: Normal heart sounds. No murmur.  Pulmonary:     Effort: Pulmonary effort is normal. No respiratory distress.     Breath sounds: Normal breath sounds. No decreased air movement. No decreased breath sounds, wheezing or rales.  Musculoskeletal:     Cervical back: Normal range of motion.  Skin:    General: Skin is warm and dry.     Capillary Refill: Capillary refill takes less than 2 seconds.  Neurological:     General: No focal deficit present.     Mental Status: She is alert and oriented to person, place, and time. Mental status is at baseline.     Gait: Gait normal.  Psychiatric:        Mood and Affect: Mood normal.        Behavior: Behavior normal.        Thought Content: Thought content normal.        Judgment: Judgment normal.       Assessment & Plan:   OSA (obstructive sleep apnea) Severe obstructive sleep apnea Excellent CPAP compliance  Plan: Continue CPAP Follow-up in 1 year   Obesity, unspecified BMI 36.4  Plan: Continue to work on reducing BMI Work with primary care on weight loss strategies such as low carbohydrate diet, increasing physical activity    Return in about 1 year (around 03/30/2021), or if symptoms worsen or fail to improve, for Follow up with Dr. Elsworth Soho, Follow up with Wyn Quaker  FNP-C.   Lauraine Rinne, NP 03/30/2020   This appointment required 25 minutes of patient care (this includes precharting, chart review, review of results, face-to-face care, etc.).

## 2020-03-30 NOTE — Addendum Note (Signed)
Addended by: Vanessa Barbara on: 03/30/2020 02:19 PM   Modules accepted: Orders

## 2020-03-30 NOTE — Patient Instructions (Addendum)
You were seen today by Lauraine Rinne, NP  for:   1. OSA (obstructive sleep apnea)  Refill CPAP supplies   We recommend that you continue using your CPAP daily >>>Keep up the hard work using your device >>> Goal should be wearing this for the entire night that you are sleeping, at least 4 to 6 hours  Remember:  . Do not drive or operate heavy machinery if tired or drowsy.  . Please notify the supply company and office if you are unable to use your device regularly due to missing supplies or machine being broken.  . Work on maintaining a healthy weight and following your recommended nutrition plan  . Maintain proper daily exercise and movement  . Maintaining proper use of your device can also help improve management of other chronic illnesses such as: Blood pressure, blood sugars, and weight management.   BiPAP/ CPAP Cleaning:  >>>Clean weekly, with Dawn soap, and bottle brush.  Set up to air dry. >>> Wipe mask out daily with wet wipe or towelette    Follow Up:    Return in about 1 year (around 03/30/2021), or if symptoms worsen or fail to improve, for Follow up with Dr. Elsworth Soho, Follow up with Wyn Quaker FNP-C.   Please do your part to reduce the spread of COVID-19:      Reduce your risk of any infection  and COVID19 by using the similar precautions used for avoiding the common cold or flu:  Marland Kitchen Wash your hands often with soap and warm water for at least 20 seconds.  If soap and water are not readily available, use an alcohol-based hand sanitizer with at least 60% alcohol.  . If coughing or sneezing, cover your mouth and nose by coughing or sneezing into the elbow areas of your shirt or coat, into a tissue or into your sleeve (not your hands). Langley Gauss A MASK when in public  . Avoid shaking hands with others and consider head nods or verbal greetings only. . Avoid touching your eyes, nose, or mouth with unwashed hands.  . Avoid close contact with people who are sick. . Avoid places or  events with large numbers of people in one location, like concerts or sporting events. . If you have some symptoms but not all symptoms, continue to monitor at home and seek medical attention if your symptoms worsen. . If you are having a medical emergency, call 911.   [place] / e-Visit: [email or web address]         [facility]: [facility]: [id number]                   [facility]: [id number]     It is flu season:   >>> Best ways to protect herself from the flu: Receive the yearly flu vaccine, practice good hand hygiene washing with soap and also using hand sanitizer when available, eat a nutritious meals, get adequate rest, hydrate appropriately   Please contact the office if your symptoms worsen or you have concerns that you are not improving.   Thank you for choosing [facility] for your healthcare, and for allowing [place] to partner with you on your healthcare journey. I am thankful to be able to provide care to you today.   Wyn Quaker FNP-C

## 2020-03-30 NOTE — Assessment & Plan Note (Signed)
Severe obstructive sleep apnea Excellent CPAP compliance  Plan: Continue CPAP Follow-up in 1 year

## 2020-04-11 DIAGNOSIS — G4733 Obstructive sleep apnea (adult) (pediatric): Secondary | ICD-10-CM | POA: Diagnosis not present

## 2020-05-10 ENCOUNTER — Other Ambulatory Visit: Payer: Self-pay | Admitting: Family Medicine

## 2020-05-10 ENCOUNTER — Other Ambulatory Visit: Payer: Self-pay | Admitting: Obstetrics & Gynecology

## 2020-05-10 MED FILL — AMLODIPINE BESYLATE 10 MG T: 10 | 90 days supply | Qty: 90 | Fill #3

## 2020-05-10 MED FILL — PANTOPRAZOLE SOD DR 20 MG T: 20 | 90 days supply | Qty: 90 | Fill #0

## 2020-05-15 ENCOUNTER — Other Ambulatory Visit: Payer: Self-pay | Admitting: Obstetrics & Gynecology

## 2020-05-15 MED FILL — NYSTATIN-TRIAMCINOLONE CRM: 100000-0.1 | 15 days supply | Qty: 30 | Fill #0

## 2020-05-29 ENCOUNTER — Other Ambulatory Visit: Payer: Self-pay | Admitting: Family Medicine

## 2020-05-29 MED FILL — BENAZEPRIL HCL 10 MG TABLET: 10 | 90 days supply | Qty: 90 | Fill #0

## 2020-06-04 ENCOUNTER — Other Ambulatory Visit: Payer: Self-pay

## 2020-06-04 DIAGNOSIS — I10 Essential (primary) hypertension: Secondary | ICD-10-CM

## 2020-06-06 DIAGNOSIS — D2262 Melanocytic nevi of left upper limb, including shoulder: Secondary | ICD-10-CM | POA: Diagnosis not present

## 2020-06-06 DIAGNOSIS — D2261 Melanocytic nevi of right upper limb, including shoulder: Secondary | ICD-10-CM | POA: Diagnosis not present

## 2020-06-06 DIAGNOSIS — D485 Neoplasm of uncertain behavior of skin: Secondary | ICD-10-CM | POA: Diagnosis not present

## 2020-06-06 DIAGNOSIS — L821 Other seborrheic keratosis: Secondary | ICD-10-CM | POA: Diagnosis not present

## 2020-06-06 DIAGNOSIS — D225 Melanocytic nevi of trunk: Secondary | ICD-10-CM | POA: Diagnosis not present

## 2020-06-06 DIAGNOSIS — L814 Other melanin hyperpigmentation: Secondary | ICD-10-CM | POA: Diagnosis not present

## 2020-06-27 ENCOUNTER — Encounter: Payer: BC Managed Care – PPO | Admitting: Family Medicine

## 2020-07-03 DIAGNOSIS — D2261 Melanocytic nevi of right upper limb, including shoulder: Secondary | ICD-10-CM | POA: Diagnosis not present

## 2020-07-03 DIAGNOSIS — D485 Neoplasm of uncertain behavior of skin: Secondary | ICD-10-CM | POA: Diagnosis not present

## 2020-07-03 MED FILL — MUPIROCIN 2% OINTMENT: 2 | 7 days supply | Qty: 22 | Fill #0

## 2020-07-05 ENCOUNTER — Other Ambulatory Visit: Payer: Self-pay

## 2020-07-05 ENCOUNTER — Encounter: Payer: Self-pay | Admitting: Family Medicine

## 2020-07-05 ENCOUNTER — Ambulatory Visit (INDEPENDENT_AMBULATORY_CARE_PROVIDER_SITE_OTHER): Payer: BC Managed Care – PPO | Admitting: Family Medicine

## 2020-07-05 VITALS — BP 126/78 | HR 66 | Temp 98.4°F | Ht 66.0 in | Wt 223.2 lb

## 2020-07-05 DIAGNOSIS — R7303 Prediabetes: Secondary | ICD-10-CM | POA: Diagnosis not present

## 2020-07-05 DIAGNOSIS — Z0000 Encounter for general adult medical examination without abnormal findings: Secondary | ICD-10-CM | POA: Diagnosis not present

## 2020-07-05 DIAGNOSIS — I10 Essential (primary) hypertension: Secondary | ICD-10-CM

## 2020-07-05 NOTE — Progress Notes (Signed)
Patient: [name] MRN: [id number] DOB: [birth year] PCP: Orma Flaming, MD     Subjective:  Chief Complaint  Patient presents with  . Annual Exam  . Hypertension  . Prediabetes    HPI: The patient is a 54 y.o. female who presents today for annual exam. She denies any changes to past medical history. There have been no recent hospitalizations. They are following a well balanced diet and exercise plan. Pt says that she walks, and has a bike.  Weight has been decreasing steadily. No complaints today.   Hypertension: Here for follow up of hypertension.  Currently on norvasc 10mg day and benazepril 10mg /day. Takes medication as prescribed and denies any side effects. Exercise includes walking. Weight has been slowly decreasing. Denies any chest pain, headaches, shortness of breath, vision changes, swelling in lower extremities.   Prediabetes Last a1c was 6.2 in 07/2019. Has lost about 12 pounds in a year. Not eating like she should due to some changes in her life with new grandbaby who is in NICU.   Followed by derm just had biopsy done on right back shoulder.   Immunization History  Administered Date(s) Administered  . Influenza Split 09/17/2011  . Influenza Whole 09/18/2008, 09/17/2009  . Influenza, Seasonal, Injecte, Preservative Fre 10/04/2012  . Influenza,inj,Quad PF,6+ Mos 09/07/2014, 09/10/2015, 10/09/2016, 06/30/2017, 10/15/2018, 08/17/2019  . PFIZER SARS-COV-2 Vaccination 01/18/2020, 02/14/2020  . Pneumococcal Polysaccharide-23 03/19/2009  . Td 09/13/2007  . Tdap 12/28/2017   Colonoscopy: 08/08/2013 Mammogram: 10/13/2019 Pap smear: 10/20/2018   Review of Systems  Constitutional: Negative for chills, fatigue and fever.  HENT: Negative for dental problem, ear pain, hearing loss and trouble swallowing.   Eyes: Negative for visual disturbance.  Respiratory: Negative for cough, chest tightness, shortness of breath and wheezing.   Cardiovascular: Negative for chest pain,  palpitations and leg swelling.  Gastrointestinal: Negative for abdominal pain, blood in stool, diarrhea and nausea.  Endocrine: Negative for cold intolerance, polydipsia, polyphagia and polyuria.  Genitourinary: Negative for dysuria, frequency, hematuria and urgency.  Musculoskeletal: Negative for arthralgias.  Skin: Negative for rash.  Neurological: Negative for dizziness and headaches.  Psychiatric/Behavioral: Negative for dysphoric mood and sleep disturbance. The patient is not nervous/anxious.     Allergies Patient has No Known Allergies.  Past Medical History Patient  has a past medical history of Anemia, Arthritis, Diverticulosis of colon (without mention of hemorrhage), FIBROIDS, UTERUS, GERD, HYPERTENSION, NSVD (normal spontaneous vaginal delivery), Obesity, unspecified, Osteopenia (11/2017), Other abnormal glucose, and THROMBOCYTHEMIA.  Surgical History Patient  has a past surgical history that includes Knee arthroscopy (Right, 2006); RESECTOSCOPIC POLYPECTOMY; Hysteroscopy (06/19/2005); Colonoscopy; Tubal ligation (2006); Uterine fibroid surgery; Total knee arthroplasty (Right, 10/10/2015); Diagnostic laparoscopy; Laparoscopic bilateral salpingo oophorectomy (Bilateral, 03/04/2016); and Lysis of adhesion (N/A, 03/04/2016).  Family History Pateint's family history includes AVM in her mother; Breast cancer (age of onset: 23) in her mother; Cancer (age of onset: 67) in her mother; Diabetes in her father and maternal grandmother; Heart disease (age of onset: 35) in her father; Hyperlipidemia in her father; Hypertension in her father; Stroke in her mother.  Social History Patient  reports that she has quit smoking. Her smoking use included cigarettes. She has never used smokeless tobacco. She reports current alcohol use. She reports that she does not use drugs.    Objective: Vitals:   07/05/20 1009  BP: 126/78  Pulse: 66  Temp: 98.4 F (36.9 C)  TempSrc: Temporal  SpO2: 98%  Weight:  223 lb 3.2 oz (101.2 kg)  Height: 5'  6" (1.676 m)    Body mass index is 36.03 kg/m.  Physical Exam Vitals reviewed.  Constitutional:      Appearance: She is well-developed.  HENT:     Head: Normocephalic and atraumatic.     Right Ear: Tympanic membrane, ear canal and external ear normal.     Left Ear: Tympanic membrane, ear canal and external ear normal.     Nose: Nose normal.     Mouth/Throat:     Mouth: Mucous membranes are moist.  Eyes:     Extraocular Movements: Extraocular movements intact.     Conjunctiva/sclera: Conjunctivae normal.     Pupils: Pupils are equal, round, and reactive to light.  Neck:     Thyroid: No thyromegaly.     Vascular: No carotid bruit.  Cardiovascular:     Rate and Rhythm: Normal rate and regular rhythm.     Pulses: Normal pulses.     Heart sounds: Normal heart sounds. No murmur heard.   Pulmonary:     Effort: Pulmonary effort is normal.     Breath sounds: Normal breath sounds.  Abdominal:     General: Bowel sounds are normal. There is no distension.     Palpations: Abdomen is soft.     Tenderness: There is no abdominal tenderness.  Musculoskeletal:     Cervical back: Normal range of motion and neck supple.  Lymphadenopathy:     Cervical: No cervical adenopathy.  Skin:    General: Skin is warm and dry.     Capillary Refill: Capillary refill takes less than 2 seconds.     Findings: No rash.  Neurological:     General: No focal deficit present.     Mental Status: She is alert and oriented to person, place, and time.     Cranial Nerves: No cranial nerve deficit.     Coordination: Coordination normal.     Deep Tendon Reflexes: Reflexes normal.  Psychiatric:        Mood and Affect: Mood normal.        Behavior: Behavior normal.       Office Visit from 07/05/2020 in [place]  PHQ-2 Total Score 0         Assessment/plan: 1. Annual physical exam Routine fasting labs today. HM reviewed and UTD. Has eye exam  in October. Really proud of her for losing weight. She is going to continue to work on this. F/u in one year or as needed.  Patient counseling [x]    Nutrition: Stressed importance of moderation in sodium/caffeine intake, saturated fat and cholesterol, caloric balance, sufficient intake of fresh fruits, vegetables, fiber, calcium, iron, and 1 mg of folate supplement per day (for females capable of pregnancy).  [x]    Stressed the importance of regular exercise.   []    Substance Abuse: Discussed cessation/primary prevention of tobacco, alcohol, or other drug use; driving or other dangerous activities under the influence; availability of treatment for abuse.   [x]    Injury prevention: Discussed safety belts, safety helmets, smoke detector, smoking near bedding or upholstery.   [x]    Sexuality: Discussed sexually transmitted diseases, partner selection, use of condoms, avoidance of unintended pregnancy  and contraceptive alternatives.  [x]    Dental health: Discussed importance of regular tooth brushing, flossing, and dental visits.  [x]    Health maintenance and immunizations reviewed. Please refer to Health maintenance section.    - TSH; Future  2. Essential hypertension Blood pressure is to goal. Continue current anti-hypertensive medications per  hpi. Refills not given and routine lab work will be done today. Recommended routine exercise and healthy diet including DASH diet and mediterranean diet. Encouraged weight loss. F/u in 12 months.   - CBC with Differential/Platelet; Future - Comprehensive metabolic panel; Future - Lipid panel; Future  3. Prediabetes  - Hemoglobin A1c; Future    This visit occurred during the SARS-CoV-2 public health emergency.  Safety protocols were in place, including screening questions prior to the visit, additional usage of staff PPE, and extensive cleaning of exam room while observing appropriate contact time as indicated for disinfecting solutions.     Return  in about 1 year (around 07/05/2021) for annual/htn/prediabetes .     Orma Flaming, MD [place]  07/05/2020

## 2020-07-05 NOTE — Patient Instructions (Signed)
1) if you test positive, I need to know right away so I can set you up with infusion. We have here at [facility] I just have to call and get you set up with them. I treat with other stuff as well.   2) I am encouraging everyone to have a covid kit at home that includes a pulse ox and nebulizer machine. If you get covid you have these two vital things to monitor oxygen and then I do a diluted hydrogen peroxide neb. You need to get the 3% food grade hydrogen peroxide and dilute 50-50 with distilled water or saline (example 3cc hydrogen peroxide with 3 cc water) twice a day. I would also put in a drop of lugol's iodine if you can find. Only use the above if you get sick.    Also while covid is rampant you can take vitamins for prevention then if you get covid,  Below is prevention protocol.   1) vitamin D3 2000IU/day 2) vitamin C: 500-1062m twice a day 3) quercetin: 5055mdaily 4) melatonin 51m61mefore bedtime (may cause drowsiness)  Gargle mouthwash like scope/act/crest twice a day.   Call me right away if you get covid/set up telehealth appointment so we can discuss treatment.    Preventive Care 40-76 43ars Old, Female Preventive care refers to visits with your health care provider and lifestyle choices that can promote health and wellness. This includes:  A yearly physical exam. This may also be called an annual well check.  Regular dental visits and eye exams.  Immunizations.  Screening for certain conditions.  Healthy lifestyle choices, such as eating a healthy diet, getting regular exercise, not using drugs or products that contain nicotine and tobacco, and limiting alcohol use. What can I expect for my preventive care visit? Physical exam Your health care provider will check your:  Height and weight. This may be used to calculate body mass index (BMI), which tells if you are at a healthy weight.  Heart rate and blood pressure.  Skin for abnormal spots. Counseling Your health care  provider may ask you questions about your:  Alcohol, tobacco, and drug use.  Emotional well-being.  Home and relationship well-being.  Sexual activity.  Eating habits.  Work and work envStatisticianMethod of birth control.  Menstrual cycle.  Pregnancy history. What immunizations do I need?  Influenza (flu) vaccine  This is recommended every year. Tetanus, diphtheria, and pertussis (Tdap) vaccine  You may need a Td booster every 10 years. Varicella (chickenpox) vaccine  You may need this if you have not been vaccinated. Zoster (shingles) vaccine  You may need this after age 81.54easles, mumps, and rubella (MMR) vaccine  You may need at least one dose of MMR if you were born in 1957 or later. You may also need a second dose. Pneumococcal conjugate (PCV13) vaccine  You may need this if you have certain conditions and were not previously vaccinated. Pneumococcal polysaccharide (PPSV23) vaccine  You may need one or two doses if you smoke cigarettes or if you have certain conditions. Meningococcal conjugate (MenACWY) vaccine  You may need this if you have certain conditions. Hepatitis A vaccine  You may need this if you have certain conditions or if you travel or work in places where you may be exposed to hepatitis A. Hepatitis B vaccine  You may need this if you have certain conditions or if you travel or work in places where you may be exposed to hepatitis B. Haemophilus influenzae  type b (Hib) vaccine  You may need this if you have certain conditions. Human papillomavirus (HPV) vaccine  If recommended by your health care provider, you may need three doses over 6 months. You may receive vaccines as individual doses or as more than one vaccine together in one shot (combination vaccines). Talk with your health care provider about the risks and benefits of combination vaccines. What tests do I need? Blood tests  Lipid and cholesterol levels. These may be checked  every 5 years, or more frequently if you are over 55 years old.  Hepatitis C test.  Hepatitis B test. Screening  Lung cancer screening. You may have this screening every year starting at age 15 if you have a 30-pack-year history of smoking and currently smoke or have quit within the past 15 years.  Colorectal cancer screening. All adults should have this screening starting at age 72 and continuing until age 37. Your health care provider may recommend screening at age 42 if you are at increased risk. You will have tests every 1-10 years, depending on your results and the type of screening test.  Diabetes screening. This is done by checking your blood sugar (glucose) after you have not eaten for a while (fasting). You may have this done every 1-3 years.  Mammogram. This may be done every 1-2 years. Talk with your health care provider about when you should start having regular mammograms. This may depend on whether you have a family history of breast cancer.  BRCA-related cancer screening. This may be done if you have a family history of breast, ovarian, tubal, or peritoneal cancers.  Pelvic exam and Pap test. This may be done every 3 years starting at age 74. Starting at age 60, this may be done every 5 years if you have a Pap test in combination with an HPV test. Other tests  Sexually transmitted disease (STD) testing.  Bone density scan. This is done to screen for osteoporosis. You may have this scan if you are at high risk for osteoporosis. Follow these instructions at home: Eating and drinking  Eat a diet that includes fresh fruits and vegetables, whole grains, lean protein, and low-fat dairy.  Take vitamin and mineral supplements as recommended by your health care provider.  Do not drink alcohol if: ? Your health care provider tells you not to drink. ? You are pregnant, may be pregnant, or are planning to become pregnant.  If you drink alcohol: ? Limit how much you have to 0-1  drink a day. ? Be aware of how much alcohol is in your drink. In the [place] one drink equals one 12 oz bottle of beer (355 mL), one 5 oz glass of wine (148 mL), or one 1 oz glass of hard liquor (44 mL). Lifestyle  Take daily care of your teeth and gums.  Stay active. Exercise for at least 30 minutes on 5 or more days each week.  Do not use any products that contain nicotine or tobacco, such as cigarettes, e-cigarettes, and chewing tobacco. If you need help quitting, ask your health care provider.  If you are sexually active, practice safe sex. Use a condom or other form of birth control (contraception) in order to prevent pregnancy and STIs (sexually transmitted infections).  If told by your health care provider, take low-dose aspirin daily starting at age 73. What's next?  Visit your health care provider once a year for a well check visit.  Ask your health care provider how often  you should have your eyes and teeth checked.  Stay up to date on all vaccines. This information is not intended to replace advice given to you by your health care provider. Make sure you discuss any questions you have with your health care provider. Document Revised: 07/01/2018 Document Reviewed: 07/01/2018 Elsevier Patient Education  2020 [employer].

## 2020-07-06 DIAGNOSIS — G4733 Obstructive sleep apnea (adult) (pediatric): Secondary | ICD-10-CM | POA: Diagnosis not present

## 2020-07-06 LAB — LIPID PANEL
Cholesterol: 167 mg/dL (ref <200)
HDL: 51 mg/dL (ref >=50)
LDL Cholesterol (Calc): 96 mg/dL (calc)
Non-HDL Cholesterol (Calc): 116 mg/dL (calc) (ref <130)
Total CHOL/HDL Ratio: 3.3 (calc) (ref <5.0)
Triglycerides: 107 mg/dL (ref <150)

## 2020-07-06 LAB — COMPREHENSIVE METABOLIC PANEL
AG Ratio: 1.5 (calc) (ref 1.0–2.5)
ALT: 27 U/L (ref 6–29)
AST: 18 U/L (ref 10–35)
Albumin: 4.7 g/dL (ref 3.6–5.1)
Alkaline phosphatase (APISO): 60 U/L (ref 37–153)
BUN: 17 mg/dL (ref 7–25)
CO2: 24 mmol/L (ref 20–32)
Calcium: 9.6 mg/dL (ref 8.6–10.4)
Chloride: 104 mmol/L (ref 98–110)
Creat: 0.85 mg/dL (ref 0.50–1.05)
Globulin: 3.2 g/dL (calc) (ref 1.9–3.7)
Glucose, Bld: 106 mg/dL — ABNORMAL HIGH (ref 65–99)
Potassium: 4.8 mmol/L (ref 3.5–5.3)
Sodium: 140 mmol/L (ref 135–146)
Total Bilirubin: 0.4 mg/dL (ref 0.2–1.2)
Total Protein: 7.9 g/dL (ref 6.1–8.1)

## 2020-07-06 LAB — CBC WITH DIFFERENTIAL/PLATELET
Absolute Monocytes: 394 cells/uL (ref 200–950)
Basophils Absolute: 49 cells/uL (ref 0–200)
Basophils Relative: 0.9 %
Eosinophils Absolute: 162 cells/uL (ref 15–500)
Eosinophils Relative: 3 %
HCT: 42.3 % (ref 35.0–45.0)
Hemoglobin: 14 g/dL (ref 11.7–15.5)
Lymphs Abs: 1226 cells/uL (ref 850–3900)
MCH: 29.5 pg (ref 27.0–33.0)
MCHC: 33.1 g/dL (ref 32.0–36.0)
MCV: 89.1 fL (ref 80.0–100.0)
MPV: 9.8 fL (ref 7.5–12.5)
Monocytes Relative: 7.3 %
Neutro Abs: 3569 cells/uL (ref 1500–7800)
Neutrophils Relative %: 66.1 %
Platelets: 613 10*3/uL — ABNORMAL HIGH (ref 140–400)
RBC: 4.75 10*6/uL (ref 3.80–5.10)
RDW: 13.3 % (ref 11.0–15.0)
Total Lymphocyte: 22.7 %
WBC: 5.4 10*3/uL (ref 3.8–10.8)

## 2020-07-06 LAB — MICROALBUMIN / CREATININE URINE RATIO
Creatinine, Urine: 131 mg/dL (ref 20–275)
Microalb Creat Ratio: 11 mcg/mg creat (ref <30)
Microalb, Ur: 1.5 mg/dL

## 2020-07-06 LAB — HEMOGLOBIN A1C
Hgb A1c MFr Bld: 5.9 % of total Hgb — ABNORMAL HIGH (ref <5.7)
Mean Plasma Glucose: 123 (calc)
eAG (mmol/L): 6.8 (calc)

## 2020-07-06 LAB — TSH: TSH: 2.34 mIU/L (ref 0.40–4.50)

## 2020-07-13 DIAGNOSIS — Z4802 Encounter for removal of sutures: Secondary | ICD-10-CM | POA: Diagnosis not present

## 2020-08-09 ENCOUNTER — Other Ambulatory Visit: Payer: Self-pay | Admitting: Family Medicine

## 2020-08-09 MED FILL — PANTOPRAZOLE SOD DR 20 MG T: 20 | 90 days supply | Qty: 90 | Fill #0

## 2020-08-09 MED FILL — AMLODIPINE BESYLATE 10 MG T: 10 | 90 days supply | Qty: 90 | Fill #0

## 2020-08-21 DIAGNOSIS — H04123 Dry eye syndrome of bilateral lacrimal glands: Secondary | ICD-10-CM | POA: Diagnosis not present

## 2020-08-21 DIAGNOSIS — H5203 Hypermetropia, bilateral: Secondary | ICD-10-CM | POA: Diagnosis not present

## 2020-08-31 ENCOUNTER — Other Ambulatory Visit: Payer: Self-pay | Admitting: Family Medicine

## 2020-08-31 MED FILL — BENAZEPRIL HCL 10 MG TABLET: 10 | 90 days supply | Qty: 90 | Fill #0

## 2020-09-12 ENCOUNTER — Ambulatory Visit (INDEPENDENT_AMBULATORY_CARE_PROVIDER_SITE_OTHER): Payer: BC Managed Care – PPO

## 2020-09-12 ENCOUNTER — Encounter: Payer: Self-pay | Admitting: Family Medicine

## 2020-09-12 ENCOUNTER — Other Ambulatory Visit: Payer: Self-pay

## 2020-09-12 DIAGNOSIS — Z23 Encounter for immunization: Secondary | ICD-10-CM | POA: Diagnosis not present

## 2020-09-12 NOTE — Progress Notes (Signed)
Patient came into the office to receive her flu shot today. She tolerated the injection well. Left arm. No questions or concerns at this time.

## 2020-10-08 DIAGNOSIS — G4733 Obstructive sleep apnea (adult) (pediatric): Secondary | ICD-10-CM | POA: Diagnosis not present

## 2020-10-11 MED FILL — NYSTATIN-TRIAMCINOLONE CRM: 100000-0.1 | 15 days supply | Qty: 30 | Fill #1

## 2020-11-06 ENCOUNTER — Other Ambulatory Visit: Payer: Self-pay | Admitting: Family Medicine

## 2020-11-06 MED FILL — AMLODIPINE BESYLATE 10 MG T: 10 | 90 days supply | Qty: 90 | Fill #1

## 2020-11-06 MED FILL — PANTOPRAZOLE SOD DR 20 MG T: 20 | 90 days supply | Qty: 90 | Fill #0

## 2020-11-09 DIAGNOSIS — Z1231 Encounter for screening mammogram for malignant neoplasm of breast: Secondary | ICD-10-CM | POA: Diagnosis not present

## 2020-11-09 LAB — HM MAMMOGRAPHY

## 2020-11-13 ENCOUNTER — Encounter: Payer: Self-pay | Admitting: Family Medicine

## 2020-11-29 ENCOUNTER — Other Ambulatory Visit: Payer: Self-pay | Admitting: Family Medicine

## 2020-11-29 MED FILL — BENAZEPRIL HCL 10 MG TABLET: 10 | 90 days supply | Qty: 90 | Fill #0

## 2020-12-04 ENCOUNTER — Other Ambulatory Visit: Payer: Self-pay

## 2020-12-04 ENCOUNTER — Ambulatory Visit (INDEPENDENT_AMBULATORY_CARE_PROVIDER_SITE_OTHER): Payer: BC Managed Care – PPO | Admitting: Obstetrics & Gynecology

## 2020-12-04 ENCOUNTER — Encounter: Payer: Self-pay | Admitting: Obstetrics & Gynecology

## 2020-12-04 VITALS — BP 138/78 | HR 86 | Resp 14 | Ht 65.5 in | Wt 230.2 lb

## 2020-12-04 DIAGNOSIS — Z780 Asymptomatic menopausal state: Secondary | ICD-10-CM

## 2020-12-04 DIAGNOSIS — Z6837 Body mass index (BMI) 37.0-37.9, adult: Secondary | ICD-10-CM

## 2020-12-04 DIAGNOSIS — Z1272 Encounter for screening for malignant neoplasm of vagina: Secondary | ICD-10-CM | POA: Diagnosis not present

## 2020-12-04 DIAGNOSIS — M85851 Other specified disorders of bone density and structure, right thigh: Secondary | ICD-10-CM | POA: Diagnosis not present

## 2020-12-04 DIAGNOSIS — Z01419 Encounter for gynecological examination (general) (routine) without abnormal findings: Secondary | ICD-10-CM | POA: Diagnosis not present

## 2020-12-04 NOTE — Progress Notes (Signed)
[name] [birth year] [id number]   History:    55 y.o. G3P3L3 Widowed x 5 1/2 yrs.  Has a grand-daughter.  DT:[id number] presenting for annual gyn exam   NLZ:[id number] menopause post BSO 03/2016,well on no hormone replacement therapy. No postmenopausal bleeding. No pelvic pain. Abstinent. Urine and bowel movements normal. Breasts normal.  Mother Dxed with Breast Ca at age 82. Body mass index 37.73. Planning to walk and do the recumbent bike.  Low carb diet. Health labs with family physician.  [place] 2014.  Past medical history,surgical history, family history and social history were all reviewed and documented in the EPIC chart.  Gynecologic History Patient's last menstrual period was 12/24/2015 (lmp unknown).  Obstetric History OB History  Gravida Para Term Preterm AB Living  3 3 3     3   SAB IAB Ectopic Multiple Live Births          3    # Outcome Date GA Lbr Len/2nd Weight Sex Delivery Anes PTL Lv  3 Term     M Vag-Spont  N LIV  2 Term     F Vag-Spont  N LIV  1 Term     F Vag-Spont  N LIV     ROS: A ROS was performed and pertinent positives and negatives are included in the history.  GENERAL: No fevers or chills. HEENT: No change in vision, no earache, sore throat or sinus congestion. NECK: No pain or stiffness. CARDIOVASCULAR: No chest pain or pressure. No palpitations. PULMONARY: No shortness of breath, cough or wheeze. GASTROINTESTINAL: No abdominal pain, nausea, vomiting or diarrhea, melena or bright red blood per rectum. GENITOURINARY: No urinary frequency, urgency, hesitancy or dysuria. MUSCULOSKELETAL: No joint or muscle pain, no back pain, no recent trauma. DERMATOLOGIC: No rash, no itching, no lesions. ENDOCRINE: No polyuria, polydipsia, no heat or cold intolerance. No recent change in weight. HEMATOLOGICAL: No anemia or easy bruising or bleeding. NEUROLOGIC: No headache, seizures, numbness, tingling or weakness. PSYCHIATRIC: No depression, no  loss of interest in normal activity or change in sleep pattern.     Exam:   BP 138/78 (BP Location: Right Arm, Patient Position: Sitting, Cuff Size: Normal)   Pulse 86   Resp 14   Ht 5' 5.5" (1.664 m)   Wt 230 lb 4 oz (104.4 kg)   LMP 12/24/2015 (LMP Unknown)   BMI 37.73 kg/m   Body mass index is 37.73 kg/m.  General appearance : Well developed well nourished female. No acute distress HEENT: Eyes: no retinal hemorrhage or exudates,  Neck supple, trachea midline, no carotid bruits, no thyroidmegaly Lungs: Clear to auscultation, no rhonchi or wheezes, or rib retractions  Heart: Regular rate and rhythm, no murmurs or gallops Breast:Examined in sitting and supine position were symmetrical in appearance, no palpable masses or tenderness,  no skin retraction, no nipple inversion, no nipple discharge, no skin discoloration, no axillary or supraclavicular lymphadenopathy Abdomen: no palpable masses or tenderness, no rebound or guarding Extremities: no edema or skin discoloration or tenderness  Pelvic: Vulva: Normal             Vagina: No gross lesions or discharge  Cervix: No gross lesions or discharge.  Pap reflex done.  Uterus  AV, normal size, shape and consistency, non-tender and mobile  Adnexa  Without masses or tenderness  Anus: Normal   Assessment/Plan:  55 y.o. female for annual exam   1. Encounter for routine gynecological examination with Papanicolaou smear of cervix Normal gynecologic exam in  Menopause, S/P BSO.  Pap reflex done.  Breast exam normal.  Screening mammo neg 11/2020.  [place] 2014.  2. 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  Well on no HRT.    3. Osteopenia of neck of right femur Vit D supplements, Ca++ 1500 mg daily total, regular weightbearing physical activities.  BD 08/2018 Very mild osteopenia at Rt femoral neck with T-Score -1.1.  Repeat BD at 5 yrs recommended.  4. Class 2 severe obesity due to excess calories with serious comorbidity and  body mass index (BMI) of 37.0 to 37.9 in adult [facility]: [place] Rhonda W) Low calorie/carb diet.  Aerobic activities 5 times a week and light weight lifting every 2 days.  Princess Bruins MD, 2:20 PM 12/04/2020

## 2020-12-04 NOTE — Addendum Note (Signed)
Addended by: Karmen Bongo T on: 12/04/2020 02:55 PM   Modules accepted: Orders

## 2020-12-05 LAB — PAP IG W/ RFLX HPV ASCU

## 2021-01-15 DIAGNOSIS — G4733 Obstructive sleep apnea (adult) (pediatric): Secondary | ICD-10-CM | POA: Diagnosis not present

## 2021-02-01 ENCOUNTER — Encounter: Payer: Self-pay | Admitting: Family Medicine

## 2021-02-01 ENCOUNTER — Other Ambulatory Visit: Payer: Self-pay

## 2021-02-01 MED ORDER — PANTOPRAZOLE SODIUM 20 MG PO TBEC: DELAYED_RELEASE_TABLET | ORAL | 2 refills | Status: DC

## 2021-02-02 ENCOUNTER — Other Ambulatory Visit (HOSPITAL_COMMUNITY): Payer: Self-pay

## 2021-02-02 MED FILL — Pantoprazole Sodium EC Tab 20 MG (Base Equiv): ORAL | 90 days supply | Qty: 90 | Fill #0 | Status: AC

## 2021-02-03 ENCOUNTER — Other Ambulatory Visit (HOSPITAL_COMMUNITY): Payer: Self-pay

## 2021-02-05 ENCOUNTER — Other Ambulatory Visit (HOSPITAL_COMMUNITY): Payer: Self-pay

## 2021-02-05 MED FILL — Nystatin-Triamcinolone Cream 100000-0.1 Unit/GM-%: CUTANEOUS | 30 days supply | Qty: 30 | Fill #0 | Status: AC

## 2021-02-06 ENCOUNTER — Other Ambulatory Visit (HOSPITAL_COMMUNITY): Payer: Self-pay

## 2021-02-06 MED FILL — Benazepril HCl Tab 10 MG: ORAL | 90 days supply | Qty: 90 | Fill #0 | Status: CN

## 2021-02-06 MED FILL — Amlodipine Besylate Tab 10 MG (Base Equivalent): ORAL | 90 days supply | Qty: 90 | Fill #0 | Status: AC

## 2021-02-07 ENCOUNTER — Other Ambulatory Visit (HOSPITAL_COMMUNITY): Payer: Self-pay

## 2021-02-08 ENCOUNTER — Other Ambulatory Visit (HOSPITAL_COMMUNITY): Payer: Self-pay

## 2021-02-08 MED ORDER — NYSTATIN-TRIAMCINOLONE 100000-0.1 UNIT/GM-% EX CREA
TOPICAL_CREAM | Freq: Two times a day (BID) | CUTANEOUS | 4 refills | Status: AC
  Filled 2021-02-08: qty 30, 5d supply, fill #0
  Filled 2021-06-04: qty 30, 10d supply, fill #0
  Filled 2021-11-06: qty 30, 10d supply, fill #1
  Filled 2022-02-02: qty 30, 10d supply, fill #2

## 2021-02-15 ENCOUNTER — Other Ambulatory Visit (HOSPITAL_COMMUNITY): Payer: Self-pay

## 2021-02-24 ENCOUNTER — Encounter: Payer: Self-pay | Admitting: Family Medicine

## 2021-02-25 ENCOUNTER — Other Ambulatory Visit (HOSPITAL_COMMUNITY): Payer: Self-pay

## 2021-02-25 MED FILL — Benazepril HCl Tab 10 MG: ORAL | 90 days supply | Qty: 90 | Fill #0 | Status: AC

## 2021-04-04 ENCOUNTER — Ambulatory Visit: Payer: BC Managed Care – PPO | Admitting: Pulmonary Disease

## 2021-04-04 ENCOUNTER — Encounter: Payer: Self-pay | Admitting: Pulmonary Disease

## 2021-04-04 ENCOUNTER — Other Ambulatory Visit: Payer: Self-pay

## 2021-04-04 DIAGNOSIS — I10 Essential (primary) hypertension: Secondary | ICD-10-CM

## 2021-04-04 DIAGNOSIS — G4733 Obstructive sleep apnea (adult) (pediatric): Secondary | ICD-10-CM

## 2021-04-04 NOTE — Assessment & Plan Note (Signed)
CPAP download was reviewed which shows excellent compliance and good control of events with average pressure 14 cm and maximum pressure 15.  We will leave her on auto settings 5 to 15 cm.  She is really settled down with full facemask and is very compliant, CPAP is only helped improve her daytime somnolence and fatigue  Weight loss encouraged, compliance with goal of at least 4-6 hrs every night is the expectation. Advised against medications with sedative side effects Cautioned against driving when sleepy - understanding that sleepiness will vary on a day to day basis

## 2021-04-04 NOTE — Progress Notes (Signed)
   Subjective:    Patient ID: [name], female    DOB: Jan 24, 1966, 55 y.o.   MRN: [id number]  HPI  55 year old woman for follow-up of severe OSA. She was diagnosed in 2020 and placed on CPAP and full facemask  She has done really well and settled down with a fullface mask.  No dryness.  She does have occasional leak and tightens the straps and recently remark on her face in the morning. Bedtime is late and often wakes up around 9 AM.  She has hard daughter and her family living with her including a baby and a dog.  Daughter is a podiatrist like her father  Blood pressure is well controlled today, remains on 2 medications   Significant tests/ events reviewed  12/2018-home sleep study-AHI 69.4 an hour, SaO2 low 64% Review of Systems Patient denies significant dyspnea,cough, hemoptysis,  chest pain, palpitations, pedal edema, orthopnea, paroxysmal nocturnal dyspnea, lightheadedness, nausea, vomiting, abdominal or  leg pains      Objective:   Physical Exam   Gen. Pleasant, obese, in no distress ENT - no lesions, no post nasal drip Neck: No JVD, no thyromegaly, no carotid bruits Lungs: no use of accessory muscles, no dullness to percussion, decreased without rales or rhonchi  Cardiovascular: Rhythm regular, heart sounds  normal, no murmurs or gallops, no peripheral edema Musculoskeletal: No deformities, no cyanosis or clubbing , no tremors        Assessment & Plan:

## 2021-04-04 NOTE — Patient Instructions (Signed)
CPAP is working well on auto settings

## 2021-04-04 NOTE — Assessment & Plan Note (Signed)
Is better controlled on 2 medications. Weight loss was encouraged

## 2021-05-01 ENCOUNTER — Telehealth: Payer: Self-pay | Admitting: Pulmonary Disease

## 2021-05-01 NOTE — Telephone Encounter (Signed)
I called [facility] and spoke to [facility].  I verified she is calling in reference to CMN.  I checked and it is not on our log.  I gave her Chan's fax # and she states she will refax CMN now.  Nothing further needed.

## 2021-05-01 NOTE — Telephone Encounter (Signed)
Danielle from [facility] is calling to verify if the fax was received in regards to CPAP supplies stated it has been about a week.  Pls regard; [phone]

## 2021-05-09 ENCOUNTER — Other Ambulatory Visit (HOSPITAL_COMMUNITY): Payer: Self-pay

## 2021-05-09 ENCOUNTER — Telehealth: Payer: Self-pay | Admitting: Pulmonary Disease

## 2021-05-09 DIAGNOSIS — G4733 Obstructive sleep apnea (adult) (pediatric): Secondary | ICD-10-CM

## 2021-05-09 MED FILL — Pantoprazole Sodium EC Tab 20 MG (Base Equiv): ORAL | 90 days supply | Qty: 90 | Fill #1 | Status: AC

## 2021-05-09 MED FILL — Amlodipine Besylate Tab 10 MG (Base Equivalent): ORAL | 90 days supply | Qty: 90 | Fill #1 | Status: AC

## 2021-05-09 NOTE — Telephone Encounter (Signed)
Called and spoke with patient, verified that she needs an order sent to [facility] for her CPAP supplies.  She said she feels like she fell through the cracks as she thought it was sent last month.  Advised I would send in the orders now and apologized for the delay.  Advised to call with any further needs.  Nothing further needed.

## 2021-05-14 DIAGNOSIS — G4733 Obstructive sleep apnea (adult) (pediatric): Secondary | ICD-10-CM | POA: Diagnosis not present

## 2021-05-14 NOTE — Telephone Encounter (Signed)
I have faxed this to [facility] and received confirmation

## 2021-05-16 ENCOUNTER — Telehealth: Payer: Self-pay

## 2021-05-16 NOTE — Telephone Encounter (Signed)
LVM asking patient to call back to get scheduled for a TOC.

## 2021-05-27 ENCOUNTER — Telehealth: Payer: Self-pay

## 2021-05-27 ENCOUNTER — Other Ambulatory Visit (HOSPITAL_COMMUNITY): Payer: Self-pay

## 2021-05-27 MED ORDER — BENAZEPRIL HCL 10 MG PO TABS
ORAL_TABLET | Freq: Every day | ORAL | 0 refills | Status: DC
  Filled 2021-05-27: qty 90, 90d supply, fill #0

## 2021-05-27 NOTE — Telephone Encounter (Signed)
Rx sent to pharmacy   

## 2021-05-27 NOTE — Telephone Encounter (Signed)
  LAST APPOINTMENT DATE:  07/05/2020  NEXT APPOINTMENT DATE:'@9'$ /22/2022 -TOC  MEDICATION: benazepril (LOTENSIN) 10 MG tablet  Is the patient out of medication? Yes  PHARMACY: [facility]

## 2021-06-04 ENCOUNTER — Other Ambulatory Visit (HOSPITAL_COMMUNITY): Payer: Self-pay

## 2021-06-06 ENCOUNTER — Other Ambulatory Visit (HOSPITAL_COMMUNITY): Payer: Self-pay

## 2021-06-06 DIAGNOSIS — B353 Tinea pedis: Secondary | ICD-10-CM | POA: Diagnosis not present

## 2021-06-06 DIAGNOSIS — B078 Other viral warts: Secondary | ICD-10-CM | POA: Diagnosis not present

## 2021-06-06 DIAGNOSIS — L821 Other seborrheic keratosis: Secondary | ICD-10-CM | POA: Diagnosis not present

## 2021-06-06 DIAGNOSIS — L814 Other melanin hyperpigmentation: Secondary | ICD-10-CM | POA: Diagnosis not present

## 2021-06-06 MED ORDER — KETOCONAZOLE 2 % EX CREA
TOPICAL_CREAM | CUTANEOUS | 11 refills | Status: DC
  Filled 2021-06-06: qty 30, 10d supply, fill #0
  Filled 2021-06-07 (×2): qty 30, 10d supply, fill #1

## 2021-06-07 ENCOUNTER — Other Ambulatory Visit (HOSPITAL_COMMUNITY): Payer: Self-pay

## 2021-06-07 ENCOUNTER — Other Ambulatory Visit (HOSPITAL_BASED_OUTPATIENT_CLINIC_OR_DEPARTMENT_OTHER): Payer: Self-pay

## 2021-07-25 ENCOUNTER — Encounter: Payer: Self-pay | Admitting: Physician Assistant

## 2021-07-25 ENCOUNTER — Other Ambulatory Visit: Payer: Self-pay

## 2021-07-25 ENCOUNTER — Ambulatory Visit: Payer: BC Managed Care – PPO | Admitting: Physician Assistant

## 2021-07-25 ENCOUNTER — Other Ambulatory Visit: Payer: BC Managed Care – PPO

## 2021-07-25 ENCOUNTER — Other Ambulatory Visit (HOSPITAL_COMMUNITY): Payer: Self-pay

## 2021-07-25 VITALS — BP 135/81 | HR 84 | Temp 98.7°F | Ht 66.0 in | Wt 227.2 lb

## 2021-07-25 DIAGNOSIS — K219 Gastro-esophageal reflux disease without esophagitis: Secondary | ICD-10-CM

## 2021-07-25 DIAGNOSIS — M62830 Muscle spasm of back: Secondary | ICD-10-CM

## 2021-07-25 DIAGNOSIS — Z0000 Encounter for general adult medical examination without abnormal findings: Secondary | ICD-10-CM | POA: Diagnosis not present

## 2021-07-25 DIAGNOSIS — I10 Essential (primary) hypertension: Secondary | ICD-10-CM | POA: Diagnosis not present

## 2021-07-25 DIAGNOSIS — G4733 Obstructive sleep apnea (adult) (pediatric): Secondary | ICD-10-CM

## 2021-07-25 DIAGNOSIS — Z23 Encounter for immunization: Secondary | ICD-10-CM | POA: Diagnosis not present

## 2021-07-25 DIAGNOSIS — R7303 Prediabetes: Secondary | ICD-10-CM

## 2021-07-25 DIAGNOSIS — M8580 Other specified disorders of bone density and structure, unspecified site: Secondary | ICD-10-CM

## 2021-07-25 MED ORDER — PANTOPRAZOLE SODIUM 20 MG PO TBEC
DELAYED_RELEASE_TABLET | Freq: Every day | ORAL | 3 refills | Status: DC
  Filled 2021-07-25: qty 90, 90d supply, fill #0
  Filled 2021-11-06 (×2): qty 90, 90d supply, fill #1
  Filled 2022-02-02: qty 90, 90d supply, fill #2
  Filled 2022-05-06: qty 90, 90d supply, fill #3

## 2021-07-25 MED ORDER — METHOCARBAMOL 500 MG PO TABS
500.0000 mg | ORAL_TABLET | Freq: Three times a day (TID) | ORAL | 0 refills | Status: AC
  Filled 2021-07-25: qty 21, 7d supply, fill #0

## 2021-07-25 MED ORDER — AMLODIPINE BESYLATE 10 MG PO TABS
ORAL_TABLET | Freq: Every day | ORAL | 3 refills | Status: DC
  Filled 2021-07-25: qty 90, 90d supply, fill #0
  Filled 2021-11-06: qty 90, 90d supply, fill #1
  Filled 2022-02-02: qty 90, 90d supply, fill #2
  Filled 2022-05-06: qty 90, 90d supply, fill #3

## 2021-07-25 MED ORDER — BENAZEPRIL HCL 10 MG PO TABS
ORAL_TABLET | Freq: Every day | ORAL | 3 refills | Status: DC
  Filled 2021-07-25: qty 90, fill #0
  Filled 2021-08-20: qty 90, 90d supply, fill #0
  Filled 2021-11-06: qty 90, 90d supply, fill #1
  Filled 2022-02-02: qty 90, 90d supply, fill #2
  Filled 2022-05-06: qty 90, 90d supply, fill #3

## 2021-07-25 NOTE — Patient Instructions (Signed)
Good to meet you today! Schedule for fasting labs and I will send results through [facility]. Flu shot updated today. Please schedule your DEXA bone density test this year.

## 2021-07-25 NOTE — Progress Notes (Signed)
Subjective:    Patient ID: [name], female    DOB: [birth year], 55 y.o.   MRN: [id number]  Chief Complaint  Patient presents with   Annual Exam   Back Pain    HPI Patient is in today for annual CPE and TOC from Dr. Rogers Blocker.  Acute concerns: Right lower back pain x 3 weeks, feels tight, thinks it is from picking up her one year old granddaughter.   Health maintenance: Lifestyle/ exercise: Enjoys walking Nutrition: Could be better  Mental health: Husband passed away 6 years ago; grieving  Sleep: Uses CPAP, sees pulmonologist  Immunizations: Flu shot today Colonoscopy: UTD  Pap: UTD Mammogram: UTD DEXA: Osteopenia (2019), done early because of menopause (ovaries removed)   Past Medical History:  Diagnosis Date   Anemia    Arthritis    Diverticulosis of colon (without mention of hemorrhage)    FIBROIDS, UTERUS    GERD    HYPERTENSION    NSVD (normal spontaneous vaginal delivery)    X3   Obesity, unspecified    Osteopenia 11/2017   T score -1.1 FRAX 4.8% / 0.2%   Other abnormal glucose    THROMBOCYTHEMIA     Past Surgical History:  Procedure Laterality Date   COLONOSCOPY     DIAGNOSTIC LAPAROSCOPY     HYSTEROSCOPY  06/19/2005   ASPIRATION OF LEFT PARATUBAL CYST   KNEE ARTHROSCOPY Right 2006   Dr Ronnie Derby   LAPAROSCOPIC BILATERAL SALPINGO OOPHERECTOMY Bilateral 03/04/2016   Procedure: LAPAROSCOPIC BILATERAL SALPINGO OOPHORECTOMY with pelvic washings;  Surgeon: Terrance Mass, MD;  Location: [facility];  Service: Gynecology;  Laterality: Bilateral;   LYSIS OF ADHESION N/A 03/04/2016   Procedure: LYSIS OF ADHESION;  Surgeon: Terrance Mass, MD;  Location: [facility];  Service: Gynecology;  Laterality: N/A;   RESECTOSCOPIC POLYPECTOMY     TOTAL KNEE ARTHROPLASTY Right 10/10/2015   Procedure: TOTAL RIGHT KNEE ARTHROPLASTY;  Surgeon: Frederik Pear, MD;  Location: [facility];  Service: Orthopedics;  Laterality: Right;   TUBAL LIGATION  2006   FILSHIE CLIP TECHNIQUE   UTERINE  FIBROID SURGERY      Family History  Problem Relation Age of Onset   Breast cancer Mother 36       breast cancer   Cancer Mother 49       breast cancer   Stroke Mother    AVM Mother    Diabetes Father    Hypertension Father    Heart disease Father 90       CABG/CAD   Hyperlipidemia Father    Diabetes Maternal Grandmother    Colon cancer Neg Hx    Stomach cancer Neg Hx    Rectal cancer Neg Hx     Social History   Tobacco Use   Smoking status: Former    Packs/day: 0.25    Years: 7.00    Pack years: 1.75    Types: Cigarettes    Quit date: 1991    Years since quitting: 31.7   Smokeless tobacco: Never  Vaping Use   Vaping Use: Never used  Substance Use Topics   Alcohol use: Yes    Alcohol/week: 0.0 standard drinks    Comment: very occassionally   Drug use: No     No Known Allergies  Review of Systems  Constitutional:  Negative for activity change, appetite change, fever and unexpected weight change.  HENT:  Negative for congestion.   Eyes:  Negative for visual disturbance.  Respiratory:  Negative for apnea,  cough and shortness of breath.   Cardiovascular:  Negative for chest pain, palpitations and leg swelling.  Gastrointestinal:  Negative for abdominal pain, blood in stool, constipation and diarrhea.  Endocrine: Negative for polydipsia, polyphagia and polyuria.  Genitourinary:  Negative for dysuria and pelvic pain.  Musculoskeletal:  Negative for arthralgias.  Skin:  Negative for rash.  Neurological:  Negative for dizziness, weakness and headaches.  Hematological:  Negative for adenopathy. Does not bruise/bleed easily.  Psychiatric/Behavioral:  Negative for sleep disturbance and suicidal ideas. The patient is not nervous/anxious.       Objective:     BP 135/81   Pulse 84   Temp 98.7 F (37.1 C)   Ht 5\' 6"  (1.676 m)   Wt 227 lb 3.2 oz (103.1 kg)   LMP 12/24/2015 (LMP Unknown)   SpO2 98%   BMI 36.67 kg/m   Wt Readings from Last 3 Encounters:   07/25/21 227 lb 3.2 oz (103.1 kg)  04/04/21 227 lb (103 kg)  12/04/20 230 lb 4 oz (104.4 kg)    BP Readings from Last 3 Encounters:  07/25/21 135/81  04/04/21 118/70  12/04/20 138/78     Physical Exam Vitals and nursing note reviewed.  Constitutional:      Appearance: Normal appearance. She is normal weight. She is not toxic-appearing.  HENT:     Head: Normocephalic and atraumatic.     Right Ear: Tympanic membrane, ear canal and external ear normal.     Left Ear: Tympanic membrane, ear canal and external ear normal.     Nose: Nose normal.     Mouth/Throat:     Mouth: Mucous membranes are moist.  Eyes:     Extraocular Movements: Extraocular movements intact.     Conjunctiva/sclera: Conjunctivae normal.     Pupils: Pupils are equal, round, and reactive to light.  Cardiovascular:     Rate and Rhythm: Normal rate and regular rhythm.     Pulses: Normal pulses.     Heart sounds: Normal heart sounds.  Pulmonary:     Effort: Pulmonary effort is normal.     Breath sounds: Normal breath sounds.  Abdominal:     General: Abdomen is flat. Bowel sounds are normal.     Palpations: Abdomen is soft.  Musculoskeletal:        General: Normal range of motion.     Cervical back: Normal range of motion and neck supple.     Comments: Right sided paralumbar spasm  Skin:    General: Skin is warm and dry.  Neurological:     General: No focal deficit present.     Mental Status: She is alert and oriented to person, place, and time.  Psychiatric:        Mood and Affect: Mood normal.        Behavior: Behavior normal.        Thought Content: Thought content normal.        Judgment: Judgment normal.       Assessment & Plan:   Problem List Items Addressed This Visit       Cardiovascular and Mediastinum   Essential hypertension   Relevant Medications   benazepril (LOTENSIN) 10 MG tablet   amLODipine (NORVASC) 10 MG tablet     Respiratory   OSA (obstructive sleep apnea)      Digestive   GERD   Relevant Medications   pantoprazole (PROTONIX) 20 MG tablet     Other   Prediabetes   Other Visit Diagnoses  Encounter for annual physical exam    -  Primary   Spasm of muscle of lower back       Osteopenia, unspecified location       Need for immunization against influenza       Relevant Orders   Flu Vaccine QUAD 33mo+IM (Fluarix, Fluzone & Alfiuria Quad PF) (Completed)        Meds ordered this encounter  Medications   methocarbamol (ROBAXIN) 500 MG tablet    Sig: Take 1 tablet (500 mg total) by mouth 3 (three) times daily for 7 days.    Dispense:  21 tablet    Refill:  0   benazepril (LOTENSIN) 10 MG tablet    Sig: TAKE 1 TABLET BY MOUTH ONCE DAILY    Dispense:  90 tablet    Refill:  3   amLODipine (NORVASC) 10 MG tablet    Sig: TAKE 1 TABLET BY MOUTH ONCE DAILY    Dispense:  90 tablet    Refill:  3   pantoprazole (PROTONIX) 20 MG tablet    Sig: TAKE 1 TABLET BY MOUTH ONCE A DAY    Dispense:  90 tablet    Refill:  3   Age-appropriate screening and counseling performed today. Will check labs and call with results. Preventive measures discussed and printed in AVS for patient.    Her blood pressure is stable and well-controlled, her medications were refilled today as listed above.  Her acid reflux is also controlled on Protonix 20 mg daily and this was also refilled for her.  Her flu vaccine was updated today.  She has done a really great job keeping up-to-date with her health maintenance.  Vision and dental up-to-date.    She does have new right-sided low back pain, which is most likely a spasm and I prescribed Robaxin for her to take as directed and she may also try heating pads and anti-inflammatories as needed.  She is going to schedule her next bone density test this year.  She is encouraged to increase exercise and weightlifting throughout the week.  Calcium and vitamin D supplementation encouraged.  She is also going to work on her nutrition  habits.  Plan to see her in 1 year for follow-up if not sooner if any concerns come up.  Auden Tatar M [place] Capers, PA-C

## 2021-07-26 ENCOUNTER — Other Ambulatory Visit (INDEPENDENT_AMBULATORY_CARE_PROVIDER_SITE_OTHER): Payer: BC Managed Care – PPO

## 2021-07-26 ENCOUNTER — Other Ambulatory Visit: Payer: Self-pay

## 2021-07-26 DIAGNOSIS — R7303 Prediabetes: Secondary | ICD-10-CM

## 2021-07-26 DIAGNOSIS — Z0000 Encounter for general adult medical examination without abnormal findings: Secondary | ICD-10-CM

## 2021-07-26 DIAGNOSIS — I10 Essential (primary) hypertension: Secondary | ICD-10-CM

## 2021-07-26 LAB — COMPREHENSIVE METABOLIC PANEL
ALT: 25 U/L (ref 0–35)
AST: 17 U/L (ref 0–37)
Albumin: 4.6 g/dL (ref 3.5–5.2)
Alkaline Phosphatase: 51 U/L (ref 39–117)
BUN: 15 mg/dL (ref 6–23)
CO2: 28 mEq/L (ref 19–32)
Calcium: 9.5 mg/dL (ref 8.4–10.5)
Chloride: 103 mEq/L (ref 96–112)
Creatinine, Ser: 0.74 mg/dL (ref 0.40–1.20)
GFR: 89.78 mL/min (ref >60.00)
Glucose, Bld: 117 mg/dL — ABNORMAL HIGH (ref 70–99)
Potassium: 4 mEq/L (ref 3.5–5.1)
Sodium: 139 mEq/L (ref 135–145)
Total Bilirubin: 0.6 mg/dL (ref 0.2–1.2)
Total Protein: 8 g/dL (ref 6.0–8.3)

## 2021-07-26 LAB — CBC WITH DIFFERENTIAL/PLATELET
Basophils Absolute: 0 10*3/uL (ref 0.0–0.1)
Basophils Relative: 0.6 % (ref 0.0–3.0)
Eosinophils Absolute: 0.2 10*3/uL (ref 0.0–0.7)
Eosinophils Relative: 2.4 % (ref 0.0–5.0)
HCT: 40.8 % (ref 36.0–46.0)
Hemoglobin: 13.5 g/dL (ref 12.0–15.0)
Lymphocytes Relative: 15.2 % (ref 12.0–46.0)
Lymphs Abs: 1.2 10*3/uL (ref 0.7–4.0)
MCHC: 33.1 g/dL (ref 30.0–36.0)
MCV: 89 fl (ref 78.0–100.0)
Monocytes Absolute: 0.7 10*3/uL (ref 0.1–1.0)
Monocytes Relative: 9.3 % (ref 3.0–12.0)
Neutro Abs: 5.8 10*3/uL (ref 1.4–7.7)
Neutrophils Relative %: 72.5 % (ref 43.0–77.0)
Platelets: 612 10*3/uL — ABNORMAL HIGH (ref 150.0–400.0)
RBC: 4.59 Mil/uL (ref 3.87–5.11)
RDW: 14.4 % (ref 11.5–15.5)
WBC: 8 10*3/uL (ref 4.0–10.5)

## 2021-07-26 LAB — LIPID PANEL
Cholesterol: 172 mg/dL (ref 0–200)
HDL: 52.2 mg/dL (ref >39.00)
LDL Cholesterol: 98 mg/dL (ref 0–99)
NonHDL: 119.92
Total CHOL/HDL Ratio: 3
Triglycerides: 112 mg/dL (ref 0.0–149.0)
VLDL: 22.4 mg/dL (ref 0.0–40.0)

## 2021-07-26 LAB — MICROALBUMIN / CREATININE URINE RATIO
Creatinine,U: 140.5 mg/dL
Microalb Creat Ratio: 1.4 mg/g (ref 0.0–30.0)
Microalb, Ur: 2 mg/dL — ABNORMAL HIGH (ref 0.0–1.9)

## 2021-07-26 LAB — TSH: TSH: 2.03 u[IU]/mL (ref 0.35–5.50)

## 2021-07-26 LAB — VITAMIN D 25 HYDROXY (VIT D DEFICIENCY, FRACTURES): VITD: 25.49 ng/mL — ABNORMAL LOW (ref 30.00–100.00)

## 2021-07-26 LAB — HEMOGLOBIN A1C: Hgb A1c MFr Bld: 6.4 % (ref 4.6–6.5)

## 2021-08-20 ENCOUNTER — Other Ambulatory Visit (HOSPITAL_COMMUNITY): Payer: Self-pay

## 2021-09-03 ENCOUNTER — Other Ambulatory Visit: Payer: Self-pay

## 2021-09-03 ENCOUNTER — Other Ambulatory Visit (HOSPITAL_COMMUNITY): Payer: Self-pay

## 2021-09-03 ENCOUNTER — Encounter: Payer: Self-pay | Admitting: Physician Assistant

## 2021-09-03 ENCOUNTER — Ambulatory Visit: Payer: BC Managed Care – PPO | Admitting: Physician Assistant

## 2021-09-03 ENCOUNTER — Telehealth: Payer: Self-pay

## 2021-09-03 VITALS — BP 145/78 | HR 78 | Temp 98.2°F | Ht 66.0 in | Wt 228.2 lb

## 2021-09-03 DIAGNOSIS — R21 Rash and other nonspecific skin eruption: Secondary | ICD-10-CM

## 2021-09-03 MED ORDER — TRIAMCINOLONE ACETONIDE 0.1 % EX CREA
1.0000 "application " | TOPICAL_CREAM | Freq: Two times a day (BID) | CUTANEOUS | 0 refills | Status: AC
  Filled 2021-09-03: qty 80, 40d supply, fill #0

## 2021-09-03 NOTE — Progress Notes (Signed)
Subjective:    Patient ID: [name], female    DOB: November 27, 1965, 55 y.o.   MRN: [id number]  Chief Complaint  Patient presents with   Leg Pain    Leg Pain  [name] presents today with c/o bilateral leg pain that has been present for three days. Initially [place] felt tenderness in her lower legs after receiving a pedicure massage, but thought nothing of it at the time. Two days later she reports feeling what could be bruising to the back of her legs but as Monday came around, she noticed redness to the area.  [name] is also experiencing warmth and tenderness upon palpation to the area.   [name] reports her granddaughter had recently been dx with RSV and shortly after she became sick. At the time she was having SOB and chest discomfort along with nasal congestion.After taking OTC medications her sx resolved themselves and has not caused her any trouble. She doesn't believe this to be related to her current leg pain.   At first [name] believed she was having a delayed reaction to the nail salon's lotion or it could be a blood clot, due to her knowledge that she has a high platelet counts. She has admitted she does not have a steady intake of water, mostly drinking coffee or soda. In order to manage her sx she has taken aleve which provided no relief. Denies itching or CP.   Past Medical History:  Diagnosis Date   Anemia    Arthritis    Diverticulosis of colon (without mention of hemorrhage)    FIBROIDS, UTERUS    GERD    HYPERTENSION    NSVD (normal spontaneous vaginal delivery)    X3   Obesity, unspecified    Osteopenia 11/2017   T score -1.1 FRAX 4.8% / 0.2%   Other abnormal glucose    THROMBOCYTHEMIA     Past Surgical History:  Procedure Laterality Date   COLONOSCOPY     DIAGNOSTIC LAPAROSCOPY     HYSTEROSCOPY  06/19/2005   ASPIRATION OF LEFT PARATUBAL CYST   KNEE ARTHROSCOPY Right 2006   Dr Ronnie Derby   LAPAROSCOPIC BILATERAL SALPINGO OOPHERECTOMY Bilateral  03/04/2016   Procedure: LAPAROSCOPIC BILATERAL SALPINGO OOPHORECTOMY with pelvic washings;  Surgeon: Terrance Mass, MD;  Location: [facility];  Service: Gynecology;  Laterality: Bilateral;   LYSIS OF ADHESION N/A 03/04/2016   Procedure: LYSIS OF ADHESION;  Surgeon: Terrance Mass, MD;  Location: [facility];  Service: Gynecology;  Laterality: N/A;   RESECTOSCOPIC POLYPECTOMY     TOTAL KNEE ARTHROPLASTY Right 10/10/2015   Procedure: TOTAL RIGHT KNEE ARTHROPLASTY;  Surgeon: Frederik Pear, MD;  Location: [facility];  Service: Orthopedics;  Laterality: Right;   TUBAL LIGATION  2006   FILSHIE CLIP TECHNIQUE   UTERINE FIBROID SURGERY      Family History  Problem Relation Age of Onset   Breast cancer Mother 20       breast cancer   Cancer Mother 24       breast cancer   Stroke Mother    AVM Mother    Diabetes Father    Hypertension Father    Heart disease Father 44       CABG/CAD   Hyperlipidemia Father    Diabetes Maternal Grandmother    Colon cancer Neg Hx    Stomach cancer Neg Hx    Rectal cancer Neg Hx     Social History   Tobacco Use   Smoking status: Former  Packs/day: 0.25    Years: 7.00    Pack years: 1.75    Types: Cigarettes    Quit date: 30    Years since quitting: 31.8   Smokeless tobacco: Never  Vaping Use   Vaping Use: Never used  Substance Use Topics   Alcohol use: Yes    Alcohol/week: 0.0 standard drinks    Comment: very occassionally   Drug use: No     No Known Allergies  Review of Systems REFER TO HPI FOR PERTINENT POSITIVES AND NEGATIVES      Objective:     BP (!) 145/78   Pulse 78   Temp 98.2 F (36.8 C)   Ht 5\' 6"  (1.676 m)   Wt 228 lb 3.2 oz (103.5 kg)   LMP 12/24/2015 (LMP Unknown)   BMI 36.83 kg/m   Wt Readings from Last 3 Encounters:  09/03/21 228 lb 3.2 oz (103.5 kg)  07/25/21 227 lb 3.2 oz (103.1 kg)  04/04/21 227 lb (103 kg)    BP Readings from Last 3 Encounters:  09/03/21 (!) 145/78  07/25/21 135/81  04/04/21 118/70      Physical Exam Constitutional:      General: She is not in acute distress.    Appearance: Normal appearance.  Cardiovascular:     Rate and Rhythm: Normal rate and regular rhythm.     Pulses: Normal pulses.     Heart sounds: No murmur heard. Pulmonary:     Effort: Pulmonary effort is normal.     Breath sounds: Normal breath sounds.  Musculoskeletal:     Right lower leg: Swelling and tenderness present.     Left lower leg: Swelling and tenderness present.  Skin:    Comments: Bilateral lower extremities rash present as depicted in photo attached.  Homan's sign negative.   Neurological:     Mental Status: She is alert.        Assessment & Plan:   Problem List Items Addressed This Visit   None Visit Diagnoses     Rash and nonspecific skin eruption    -  Primary        Meds ordered this encounter  Medications   triamcinolone cream (KENALOG) 0.1 %    Sig: Apply 1 application topically 2 (two) times daily for 10 days.    Dispense:  80 g    Refill:  0    1. Rash and nonspecific skin eruption -Bilateral lower legs, no specific pattern -No confluence to indicate cellulitis -Neg homan's, no signs of DVT -Does not appear to be Myxedema -Could be inflammatory secondary to recent illness or pedicure. -Recommend ELEVATE above heart level. Push fluids. No salt. -Triamcinolone cream two to three times daily.  Pt to monitor and send MyChart update by Friday for me.  Dr. Yong Channel participated in eval and agrees with aforementioned.   This note was prepared with assistance of Systems analyst. Occasional wrong-word or sound-a-like substitutions may have occurred due to the inherent limitations of voice recognition software.  Time Spent: 35 minutes of total time was spent on the date of the encounter performing the following actions: chart review prior to seeing the patient, obtaining history, performing a medically necessary exam, counseling on the treatment  plan, placing orders, and documenting in our EHR.    I,Havlyn C Ratchford,acting as a scribe for [employer], PA-C.,have documented all relevant documentation on the behalf of [name] M Jamie Bostwick, PA-C,as directed by  [employer], PA-C while in the  presence of [name] M Kaylani Fromme, PA-C.   I, Gerald Kuehl M Parys Elenbaas, PA-C, have reviewed all documentation for this visit. The documentation on 09/03/21 for the exam, diagnosis, procedures, and orders are all accurate and complete.  Eviana Sibilia M Damaso Laday, PA-C

## 2021-09-03 NOTE — Patient Instructions (Signed)
This rash of your lower legs not following any specific concerning pattern. I do NOT think this is a blood clot. This does not appear to be something called Myxedema either. Could be inflammatory secondary to recent illness or pedicure. Recommend ELEVATE above heart level. Push fluids. No salt. Triamcinolone cream two to three times daily.  Monitor closely.  Send MyChart update by Friday for me.

## 2021-09-03 NOTE — Telephone Encounter (Signed)
Nurse Assessment Nurse: Kirk Ruths, RN, [facility] Date/Time Eilene Ghazi Time): 09/03/2021 11:04:14 AM Confirm and document reason for call. If symptomatic, describe symptoms. ---Caller states the back of left and top of right calves are sore and red and warm to touch did not interfere with sleep. S/s started on Sunday night it only bothers her when she touches it no new or unusual swelling feels like a bruise had a pedicure on Friday was using a lot of pressure the right has a splotchy consistent no history of blood clots feels bruised Does the patient have any new or worsening symptoms? ---Yes Will a triage be completed? ---Yes Related visit to physician within the last 2 weeks? ---No Does the PT have any chronic conditions? (i.e. diabetes, asthma, this includes High risk factors for pregnancy, etc.) ---Yes List chronic conditions. ---htn, clotting disorder? Is this a behavioral health or substance abuse call? ---No PLEASE NOTE: All timestamps contained within this report are represented as [place] Standard Time. CONFIDENTIALTY NOTICE: This fax transmission is intended only for the addressee. It contains information that is legally privileged, confidential or otherwise protected from use or disclosure. If you are not the intended recipient, you are strictly prohibited from reviewing, disclosing, copying using or disseminating any of this information or taking any action in reliance on or regarding this information. If you have received this fax in error, please notify us immediately by telephone so that we can arrange for its return to [place]. Phone: [phone], Toll-Free: [phone], Fax: [phone] Page: 2 of 2 Call Id: [id number] Guidelines Guideline Title Affirmed Question Affirmed Notes Nurse Date/Time Eilene Ghazi Time) Leg Pain Localized pain, redness or hard lump along vein Kirk Ruths, RN, [facility] 09/03/2021 11:10:41 AM Disp. Time Eilene Ghazi Time) Disposition Final User 09/03/2021 11:13:41 AM  See PCP within 24 Hours Yes Kirk Ruths, RN, [facility] Caller Disagree/Comply Comply Caller Understands Yes PreDisposition Call Doctor Care Advice Given Per Guideline SEE PCP WITHIN 24 HOURS: * IF OFFICE WILL BE OPEN: You need to be examined within the next 24 hours. Call your doctor (or NP/PA) when the office opens and make an appointment. WARM WET COMPRESS: * Apply a warm wet washcloth for 10 minutes three times a day. * This will help to increase circulation. PAIN MEDICINES: * For pain relief, you can take either acetaminophen, ibuprofen, or naproxen. * They are over-the-counter (OTC) pain drugs. You can buy them at the drugstore. * ACETAMINOPHEN - REGULAR STRENGTH TYLENOL: Take 650 mg (two 325 mg pills) by mouth every 4 to 6 hours as needed. Each Regular Strength Tylenol pill has 325 mg of acetaminophen. The most you should take is 10 pills a day (3,250 mg total). Note: In [place], the maximum is 12 pills a day (3,900 mg total). * IBUPROFEN (E.G., MOTRIN, ADVIL): Take 400 mg (two 200 mg pills) by mouth every 6 hours. The most you should take is 6 pills a day (1,200 mg total). CALL BACK IF: * Calf swelling or constant leg pain occur * Signs of infection occur (e.g., spreading redness, warmth, fever) * You become worse CARE ADVICE given per Leg Pain (Adult) guideline. Comments User: Tawni Levy, RN Date/Time Eilene Ghazi Time): 09/03/2021 11:17:47 AM due to nature of s/s warm transferred to appt line for assist Referrals REFERRED TO PCP OFFICE

## 2021-09-04 DIAGNOSIS — G4733 Obstructive sleep apnea (adult) (pediatric): Secondary | ICD-10-CM | POA: Diagnosis not present

## 2021-11-06 ENCOUNTER — Other Ambulatory Visit (HOSPITAL_COMMUNITY): Payer: Self-pay

## 2021-11-07 ENCOUNTER — Other Ambulatory Visit (HOSPITAL_COMMUNITY): Payer: Self-pay

## 2021-12-06 ENCOUNTER — Ambulatory Visit: Payer: BC Managed Care – PPO | Admitting: Obstetrics & Gynecology

## 2021-12-12 DIAGNOSIS — G4733 Obstructive sleep apnea (adult) (pediatric): Secondary | ICD-10-CM | POA: Diagnosis not present

## 2021-12-13 DIAGNOSIS — Z1231 Encounter for screening mammogram for malignant neoplasm of breast: Secondary | ICD-10-CM | POA: Diagnosis not present

## 2021-12-17 ENCOUNTER — Encounter: Payer: Self-pay | Admitting: Obstetrics & Gynecology

## 2021-12-23 ENCOUNTER — Encounter: Payer: Self-pay | Admitting: Obstetrics & Gynecology

## 2021-12-23 ENCOUNTER — Other Ambulatory Visit: Payer: Self-pay

## 2021-12-23 ENCOUNTER — Ambulatory Visit (INDEPENDENT_AMBULATORY_CARE_PROVIDER_SITE_OTHER): Payer: BC Managed Care – PPO | Admitting: Obstetrics & Gynecology

## 2021-12-23 VITALS — BP 144/84 | HR 78 | Ht 65.0 in | Wt 230.0 lb

## 2021-12-23 DIAGNOSIS — Z6838 Body mass index (BMI) 38.0-38.9, adult: Secondary | ICD-10-CM

## 2021-12-23 DIAGNOSIS — Z01419 Encounter for gynecological examination (general) (routine) without abnormal findings: Secondary | ICD-10-CM | POA: Diagnosis not present

## 2021-12-23 DIAGNOSIS — M85851 Other specified disorders of bone density and structure, right thigh: Secondary | ICD-10-CM

## 2021-12-23 DIAGNOSIS — Z780 Asymptomatic menopausal state: Secondary | ICD-10-CM

## 2021-12-23 NOTE — Progress Notes (Signed)
[name] 1966/03/13 [id number]   History:    56 y.o. G3P3L3 Widowed x 6 1/2 yrs.  Has a grand-daughter.   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  No postmenopausal bleeding.  No pelvic pain.  Abstinent.  Pap Neg 12/2020.  Urine and bowel movements normal.  Breasts normal.  Mammo Neg 12/2021.  Mother Dxed with Breast Ca at age 70.  Body mass index 38.27.  Planning to walk and do the recumbent bike.  Low carb diet.  Health labs with family physician. Colono 2014, will schedule in 2024.  BD very mild Osteopenia in 11/2017, repeat at 3 yrs next year.   Past medical history,surgical history, family history and social history were all reviewed and documented in the EPIC chart.  Gynecologic History Patient's last menstrual period was 12/24/2015 (lmp unknown).  Obstetric History OB History  Gravida Para Term Preterm AB Living  3 3 3     3   SAB IAB Ectopic Multiple Live Births          3    # Outcome Date GA Lbr Len/2nd Weight Sex Delivery Anes PTL Lv  3 Term     M Vag-Spont  N LIV  2 Term     F Vag-Spont  N LIV  1 Term     F Vag-Spont  N LIV     ROS: A ROS was performed and pertinent positives and negatives are included in the history.  GENERAL: No fevers or chills. HEENT: No change in vision, no earache, sore throat or sinus congestion. NECK: No pain or stiffness. CARDIOVASCULAR: No chest pain or pressure. No palpitations. PULMONARY: No shortness of breath, cough or wheeze. GASTROINTESTINAL: No abdominal pain, nausea, vomiting or diarrhea, melena or bright red blood per rectum. GENITOURINARY: No urinary frequency, urgency, hesitancy or dysuria. MUSCULOSKELETAL: No joint or muscle pain, no back pain, no recent trauma. DERMATOLOGIC: No rash, no itching, no lesions. ENDOCRINE: No polyuria, polydipsia, no heat or cold intolerance. No recent change in weight. HEMATOLOGICAL: No anemia or easy  bruising or bleeding. NEUROLOGIC: No headache, seizures, numbness, tingling or weakness. PSYCHIATRIC: No depression, no loss of interest in normal activity or change in sleep pattern.     Exam:   BP (!) 144/84    Pulse 78    Ht 5\' 5"  (1.651 m)    Wt 230 lb (104.3 kg)    LMP 12/24/2015 (LMP Unknown)    SpO2 98%    BMI 38.27 kg/m   Body mass index is 38.27 kg/m.  General appearance : Well developed well nourished female. No acute distress HEENT: Eyes: no retinal hemorrhage or exudates,  Neck supple, trachea midline, no carotid bruits, no thyroidmegaly Lungs: Clear to auscultation, no rhonchi or wheezes, or rib retractions  Heart: Regular rate and rhythm, no murmurs or gallops Breast:Examined in sitting and supine position were symmetrical in appearance, no palpable masses or tenderness,  no skin retraction, no nipple inversion, no nipple discharge, no skin discoloration, no axillary or supraclavicular lymphadenopathy Abdomen: no palpable masses or tenderness, no rebound or guarding Extremities: no edema or skin discoloration or tenderness  Pelvic: Vulva: Normal             Vagina: No gross lesions or discharge  Cervix: No gross lesions or discharge  Uterus  AV, normal size, shape and consistency, non-tender and mobile  Adnexa  Without masses or  tenderness  Anus: Normal   Assessment/Plan:  56 y.o. female for annual exam   1. 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  No postmenopausal bleeding.  No pelvic pain.  Abstinent.  Pap Neg 12/2020.  Urine and bowel movements normal.  Breasts normal.  Mammo Neg 12/2021.  Mother Dxed with Breast Ca at age 37.  Body mass index 38.27.  Planning to walk and do the recumbent bike.  Low carb diet.  Health labs with family physician. Colono 2014, will schedule in 2024.  BD very mild Osteopenia in 11/2017, repeat at 3 yrs next year.  2. Postmenopausal Surgical menopause  post BSO 03/2016, well on no hormone replacement therapy.  No postmenopausal bleeding.  No pelvic pain.   3. Osteopenia of neck of right femur BD very mild Osteopenia in 11/2017, repeat at 3 yrs next year.  Ca++ 1.5 g/d total, Vit D supplements, regular wt bearing physical activities. - DG Bone Density; Future  4. Class 2 severe obesity due to excess calories with serious comorbidity and body mass index (BMI) of 38.0 to 38.9 in adult [facility])  Planning to walk and do the recumbent bike.  Low carb diet.    Princess Bruins MD, 11:45 AM 12/23/2021

## 2021-12-25 DIAGNOSIS — G4733 Obstructive sleep apnea (adult) (pediatric): Secondary | ICD-10-CM | POA: Diagnosis not present

## 2022-02-03 ENCOUNTER — Other Ambulatory Visit (HOSPITAL_COMMUNITY): Payer: Self-pay

## 2022-03-07 DIAGNOSIS — G4733 Obstructive sleep apnea (adult) (pediatric): Secondary | ICD-10-CM | POA: Diagnosis not present

## 2022-04-08 ENCOUNTER — Encounter: Payer: Self-pay | Admitting: Primary Care

## 2022-04-08 ENCOUNTER — Ambulatory Visit (INDEPENDENT_AMBULATORY_CARE_PROVIDER_SITE_OTHER): Payer: BC Managed Care – PPO | Admitting: Primary Care

## 2022-04-08 VITALS — BP 140/70 | HR 96 | Temp 97.8°F | Ht 66.0 in | Wt 233.0 lb

## 2022-04-08 DIAGNOSIS — G4733 Obstructive sleep apnea (adult) (pediatric): Secondary | ICD-10-CM

## 2022-04-08 NOTE — Assessment & Plan Note (Addendum)
-   Home sleep study on 12/24/2018 showed severe obstructive sleep apnea, AHI 69.4 an hour. Patient is maintained on auto CPAP 5 to 15 cm H2O.  Reports improvement in sleep with CPAP use.  Epworth 6/24. She is 100% percent compliant with CPAP greater than 4 hours.  Average usage 7 hours 44 minutes.  Residual AHI 1.4.  No pressure changes recommended today.  Continue to encourage patient to consistently wear CPAP every night for 4 to 6 hours or longer.  Encouraged weight loss efforts.  Advised patient not drive if experiencing excessive daytime sleepiness or fatigue.  Renew CPAP supplies with [facility].  Follow-up in 1 year or sooner if needed.

## 2022-04-08 NOTE — Progress Notes (Signed)
$'@Patient'p$  ID: [name], female    DOB: 1966/07/15, 56 y.o.   MRN: [id number]  Chief Complaint  Patient presents with   Follow-up    Referring provider: Orma Flaming, MD  HPI: 56 year old female, former light smoker quit 1991.  Past medical history significant for severe obstructive sleep apnea.  Patient of Dr. Elsworth Soho, last seen in office on 04/04/2021.  Home sleep study in February 2020 showed severe obstructive sleep apnea, AHI 69.4 an hour with SPO2 low 64%.  On auto CPAP 5-15 cm H2O.  04/08/2022 Patient presents today for annual follow-up for OSA. Patient is doing well. She has no acute complaints. It took her a couple of months to get used to wearing her CPAP therapy but now she cannot sleep without using it. No issues with mask fit or pressure settings. She is working on weight loss.  DME is [facility].   [facility]view download 03/07/2022 - 04/05/2022 Usage 30/30 days (100%) greater than 4 hours Average usage 7 hours 44 minutes Pressure 5 to 15 cm H2O (14.6 cm H2O-95%) Air leaks 18.3 L/min (95%) AHI 1.6   No Known Allergies  Immunization History  Administered Date(s) Administered   Influenza Split 09/17/2011   Influenza Whole 09/18/2008, 09/17/2009   Influenza, Seasonal, Injecte, Preservative Fre 10/04/2012   Influenza,inj,Quad PF,6+ Mos 09/07/2014, 09/10/2015, 10/09/2016, 06/30/2017, 10/15/2018, 08/17/2019, 09/12/2020, 07/25/2021   PFIZER(Purple Top)SARS-COV-2 Vaccination 01/18/2020, 02/14/2020   Pneumococcal Polysaccharide-23 03/19/2009   Td 09/13/2007   Tdap 12/28/2017    Past Medical History:  Diagnosis Date   Anemia    Arthritis    Diverticulosis of colon (without mention of hemorrhage)    FIBROIDS, UTERUS    GERD    HYPERTENSION    NSVD (normal spontaneous vaginal delivery)    X3   Obesity, unspecified    Osteopenia 11/2017   T score -1.1 FRAX 4.8% / 0.2%   Other abnormal glucose    THROMBOCYTHEMIA     Tobacco History: Social History   Tobacco Use   Smoking Status Former   Packs/day: 0.25   Years: 7.00   Pack years: 1.75   Types: Cigarettes   Quit date: 1991   Years since quitting: 32.4  Smokeless Tobacco Never   Counseling given: Not Answered   Outpatient Medications Prior to Visit  Medication Sig Dispense Refill   amLODipine (NORVASC) 10 MG tablet TAKE 1 TABLET BY MOUTH ONCE DAILY 90 tablet 3   aspirin 81 MG tablet Take 81 mg by mouth at bedtime.     benazepril (LOTENSIN) 10 MG tablet TAKE 1 TABLET BY MOUTH ONCE DAILY 90 tablet 3   diclofenac sodium (VOLTAREN) 1 % GEL Apply 4 g topically 4 (four) times daily. 100 g 0   ketoconazole (NIZORAL) 2 % cream Apply to affected area on the skin twice a day 30 g 11   loratadine (CLARITIN) 10 MG tablet Take 10 mg by mouth at bedtime.      Misc Natural Products (FOCUSED MIND PO) Take by mouth.     pantoprazole (PROTONIX) 20 MG tablet TAKE 1 TABLET BY MOUTH ONCE A DAY 90 tablet 3   Turmeric 500 MG CAPS Take by mouth.     VITAMIN D PO Take by mouth.     No facility-administered medications prior to visit.    Review of Systems  Review of Systems  Constitutional: Negative.   HENT: Negative.    Respiratory: Negative.    Cardiovascular: Negative.     Physical Exam  BP 140/70 (  BP Location: Left Arm, Patient Position: Sitting, Cuff Size: Large)   Pulse 96   Temp 97.8 F (36.6 C) (Oral)   Ht '5\' 6"'$  (1.676 m)   Wt 233 lb (105.7 kg)   LMP 12/24/2015 (LMP Unknown)   SpO2 97%   BMI 37.61 kg/m  Physical Exam Constitutional:      Appearance: Normal appearance.  HENT:     Head: Normocephalic and atraumatic.  Cardiovascular:     Rate and Rhythm: Normal rate and regular rhythm.  Pulmonary:     Effort: Pulmonary effort is normal.     Breath sounds: Normal breath sounds.  Musculoskeletal:        General: Normal range of motion.  Skin:    General: Skin is warm and dry.  Neurological:     General: No focal deficit present.     Mental Status: She is alert and oriented to person,  place, and time. Mental status is at baseline.  Psychiatric:        Mood and Affect: Mood normal.        Behavior: Behavior normal.        Thought Content: Thought content normal.        Judgment: Judgment normal.     Lab Results:  CBC    Component Value Date/Time   WBC 8.0 07/26/2021 1154   RBC 4.59 07/26/2021 1154   HGB 13.5 07/26/2021 1154   HGB 15.0 12/22/2017 1127   HGB 14.1 07/14/2011 1108   HCT 40.8 07/26/2021 1154   HCT 45.1 12/22/2017 1127   HCT 41.7 07/14/2011 1108   PLT 612.0 (H) 07/26/2021 1154   PLT 690 (H) 12/22/2017 1127   MCV 89.0 07/26/2021 1154   MCV 92 12/22/2017 1127   MCV 87.7 07/14/2011 1108   MCH 29.5 07/05/2020 1052   MCHC 33.1 07/26/2021 1154   RDW 14.4 07/26/2021 1154   RDW 14.3 12/22/2017 1127   RDW 14.3 07/14/2011 1108   LYMPHSABS 1.2 07/26/2021 1154   LYMPHSABS 1.8 12/22/2017 1127   LYMPHSABS 1.9 07/14/2011 1108   MONOABS 0.7 07/26/2021 1154   MONOABS 0.6 07/14/2011 1108   EOSABS 0.2 07/26/2021 1154   EOSABS 0.3 12/22/2017 1127   BASOSABS 0.0 07/26/2021 1154   BASOSABS 0.0 12/22/2017 1127   BASOSABS 0.0 07/14/2011 1108    BMET    Component Value Date/Time   NA 139 07/26/2021 1154   NA 141 12/22/2017 1127   K 4.0 07/26/2021 1154   CL 103 07/26/2021 1154   CO2 28 07/26/2021 1154   GLUCOSE 117 (H) 07/26/2021 1154   BUN 15 07/26/2021 1154   BUN 11 12/22/2017 1127   CREATININE 0.74 07/26/2021 1154   CREATININE 0.85 07/05/2020 1052   CALCIUM 9.5 07/26/2021 1154   GFRNONAA 76 12/22/2017 1127   GFRAA 88 12/22/2017 1127    BNP No results found for: BNP  ProBNP No results found for: PROBNP  Imaging: No results found.   Assessment & Plan:   OSA (obstructive sleep apnea) - Home sleep study on 12/24/2018 showed severe obstructive sleep apnea, AHI 69.4 an hour. Patient is maintained on auto CPAP 5 to 15 cm H2O.  Reports improvement in sleep with CPAP use.  Epworth 6/24. She is 100% percent compliant with CPAP greater than 4  hours.  Average usage 7 hours 44 minutes.  Residual AHI 1.4.  No pressure changes recommended today.  Continue to encourage patient to consistently wear CPAP every night for 4 to 6 hours or longer.  Encouraged weight loss efforts.  Advised patient not drive if experiencing excessive daytime sleepiness or fatigue.  Renew CPAP supplies with [facility].  Follow-up in 1 year or sooner if needed.   Martyn Ehrich, NP 04/08/2022

## 2022-04-08 NOTE — Patient Instructions (Addendum)
Excellent compliance. Sleep apnea is well controlled on treatment, no need for pressure changes   Recommendations: Continue to wear CPAP every night  Work on weight loss efforts Do not drive if experiencing excessive daytime sleepiness  Orders: Renew CPAP supplies   Follow-up: 1 year with Dr. Elsworth Soho or Eustaquio Maize NP   CPAP and BIPAP Information CPAP and BIPAP are methods that use air pressure to keep your airways open and to help you breathe well. CPAP and BIPAP use different amounts of pressure. Your health care provider will tell you whether CPAP or BIPAP would be more helpful for you. CPAP stands for "continuous positive airway pressure." With CPAP, the amount of pressure stays the same while you breathe in (inhale) and out (exhale). BIPAP stands for "bi-level positive airway pressure." With BIPAP, the amount of pressure will be higher when you inhale and lower when you exhale. This allows you to take larger breaths. CPAP or BIPAP may be used in the hospital, or your health care provider may want you to use it at home. You may need to have a sleep study before your health care provider can order a machine for you to use at home. What are the advantages? CPAP or BIPAP can be helpful if you have: Sleep apnea. Chronic obstructive pulmonary disease (COPD). Heart failure. Medical conditions that cause muscle weakness, including muscular dystrophy or amyotrophic lateral sclerosis (ALS). Other problems that cause breathing to be shallow, weak, abnormal, or difficult. CPAP and BIPAP are most commonly used for obstructive sleep apnea (OSA) to keep the airways from collapsing when the muscles relax during sleep. What are the risks? Generally, this is a safe treatment. However, problems may occur, including: Irritated skin or skin sores if the mask does not fit properly. Dry or stuffy nose or nosebleeds. Dry mouth. Feeling gassy or bloated. Sinus or lung infection if the equipment is not cleaned  properly. When should CPAP or BIPAP be used? In most cases, the mask only needs to be worn during sleep. Generally, the mask needs to be worn throughout the night and during any daytime naps. People with certain medical conditions may also need to wear the mask at other times, such as when they are awake. Follow instructions from your health care provider about when to use the machine. What happens during CPAP or BIPAP?  Both CPAP and BIPAP are provided by a small machine with a flexible plastic tube that attaches to a plastic mask that you wear. Air is blown through the mask into your nose or mouth. The amount of pressure that is used to blow the air can be adjusted on the machine. Your health care provider will set the pressure setting and help you find the best mask for you. Tips for using the mask Because the mask needs to be snug, some people feel trapped or closed-in (claustrophobic) when first using the mask. If you feel this way, you may need to get used to the mask. One way to do this is to hold the mask loosely over your nose or mouth and then gradually apply the mask more snugly. You can also gradually increase the amount of time that you use the mask. Masks are available in various types and sizes. If your mask does not fit well, talk with your health care provider about getting a different one. Some common types of masks include: Full face masks, which fit over the mouth and nose. Nasal masks, which fit over the nose. Nasal pillow or  prong masks, which fit into the nostrils. If you are using a mask that fits over your nose and you tend to breathe through your mouth, a chin strap may be applied to help keep your mouth closed. Use a skin barrier to protect your skin as told by your health care provider. Some CPAP and BIPAP machines have alarms that may sound if the mask comes off or develops a leak. If you have trouble with the mask, it is very important that you talk with your health care  provider about finding a way to make the mask easier to tolerate. Do not stop using the mask. There could be a negative impact on your health if you stop using the mask. Tips for using the machine Place your CPAP or BIPAP machine on a secure table or stand near an electrical outlet. Know where the on/off switch is on the machine. Follow instructions from your health care provider about how to set the pressure on your machine and when you should use it. Do not eat or drink while the CPAP or BIPAP machine is on. Food or fluids could get pushed into your lungs by the pressure of the CPAP or BIPAP. For home use, CPAP and BIPAP machines can be rented or purchased through home health care companies. Many different brands of machines are available. Renting a machine before purchasing may help you find out which particular machine works well for you. Your health insurance company may also decide which machine you may get. Keep the CPAP or BIPAP machine and attachments clean. Ask your health care provider for specific instructions. Check the humidifier if you have a dry stuffy nose or nosebleeds. Make sure it is working correctly. Follow these instructions at home: Take over-the-counter and prescription medicines only as told by your health care provider. Ask if you can take sinus medicine if your sinuses are blocked. Do not use any products that contain nicotine or tobacco. These products include cigarettes, chewing tobacco, and vaping devices, such as e-cigarettes. If you need help quitting, ask your health care provider. Keep all follow-up visits. This is important. Contact a health care provider if: You have redness or pressure sores on your head, face, mouth, or nose from the mask or head gear. You have trouble using the CPAP or BIPAP machine. You cannot tolerate wearing the CPAP or BIPAP mask. Someone tells you that you snore even when wearing your CPAP or BIPAP. Get help right away if: You have  trouble breathing. You feel confused. Summary CPAP and BIPAP are methods that use air pressure to keep your airways open and to help you breathe well. If you have trouble with the mask, it is very important that you talk with your health care provider about finding a way to make the mask easier to tolerate. Do not stop using the mask. There could be a negative impact to your health if you stop using the mask. Follow instructions from your health care provider about when to use the machine. This information is not intended to replace advice given to you by your health care provider. Make sure you discuss any questions you have with your health care provider. Document Revised: 05/29/2021 Document Reviewed: 09/28/2020 Elsevier Patient Education  [place].

## 2022-05-07 ENCOUNTER — Other Ambulatory Visit (HOSPITAL_COMMUNITY): Payer: Self-pay

## 2022-06-11 DIAGNOSIS — L821 Other seborrheic keratosis: Secondary | ICD-10-CM | POA: Diagnosis not present

## 2022-06-11 DIAGNOSIS — L814 Other melanin hyperpigmentation: Secondary | ICD-10-CM | POA: Diagnosis not present

## 2022-06-11 DIAGNOSIS — L720 Epidermal cyst: Secondary | ICD-10-CM | POA: Diagnosis not present

## 2022-06-11 DIAGNOSIS — D225 Melanocytic nevi of trunk: Secondary | ICD-10-CM | POA: Diagnosis not present

## 2022-06-11 DIAGNOSIS — C4371 Malignant melanoma of right lower limb, including hip: Secondary | ICD-10-CM | POA: Diagnosis not present

## 2022-07-03 ENCOUNTER — Other Ambulatory Visit: Payer: Self-pay | Admitting: General Surgery

## 2022-07-03 DIAGNOSIS — C4371 Malignant melanoma of right lower limb, including hip: Secondary | ICD-10-CM | POA: Insufficient documentation

## 2022-07-04 DIAGNOSIS — D75839 Thrombocytosis, unspecified: Secondary | ICD-10-CM | POA: Diagnosis not present

## 2022-07-04 DIAGNOSIS — C4371 Malignant melanoma of right lower limb, including hip: Secondary | ICD-10-CM | POA: Diagnosis not present

## 2022-07-10 DIAGNOSIS — G4733 Obstructive sleep apnea (adult) (pediatric): Secondary | ICD-10-CM | POA: Diagnosis not present

## 2022-07-21 ENCOUNTER — Other Ambulatory Visit: Payer: Self-pay | Admitting: General Surgery

## 2022-07-21 ENCOUNTER — Encounter (HOSPITAL_COMMUNITY): Payer: Self-pay | Admitting: General Surgery

## 2022-07-21 ENCOUNTER — Other Ambulatory Visit: Payer: Self-pay

## 2022-07-21 NOTE — H&P (Signed)
REFERRING PHYSICIAN:  Selena Batten, MD   PROVIDER:  Georgianne Fick, MD   MRN: [id number] DOB: 1966/01/29 DATE OF ENCOUNTER: 07/04/2022 Subjective  Chief Complaint: Melanoma     History of Present Illness: [name] is a 56 y.o. 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  She sees Dr. Elvera Lennox and had what she describes as a fleshy almost pimple-like lesion.  Dr. Elvera Lennox did a biopsy and it was a superficial spreading melanoma that was 1.01 mm thick.  This was Clark's level 3.  The peripheral margins were close but the deep melanoma was free.  There was no ulceration, satellitosis, LVI, neurotropism, tumor regression, or tumor infiltrating lymphocytes.  The mitotic index was 2/mm.   The patient has not had melanoma in the past or other cancers.  Her main issues are osteoarthritis and hypertension.  She had a right knee replacement around 6 to 7 years ago and still has issues with range of motion in that same knee as the melanoma.   Patient has 3 children who are reasonably local.  One of her daughters is a podiatrist.  This one also is due with a child October 13.   Family history:  Mother had breast cancer    Review of Systems: A complete review of systems was obtained from the patient.  I have reviewed this information and discussed as appropriate with the patient.  See HPI as well for other ROS.   Review of Systems  All other systems reviewed and are negative.    Medical History: Past Medical History      Past Medical History:  Diagnosis Date   Arthritis     Hypertension     Sleep apnea             Patient Active Problem List  Diagnosis   Melanoma of lower leg, right (CMS-HCC)   Thrombocytosis      Past Surgical History       Past Surgical History:  Procedure Laterality Date   JOINT REPLACEMENT       OOPHORECTOMY Bilateral           Allergies  No Known Allergies           Current Outpatient Medications on File Prior to Visit  Medication Sig Dispense Refill   amLODIPine (NORVASC) 10 MG tablet Take 1 tablet by mouth once daily       benazepriL (LOTENSIN) 10 MG tablet Take 1 tablet by mouth once daily       pantoprazole (PROTONIX) 20 MG DR tablet Take 1 tablet by mouth once daily        No current facility-administered medications on file prior to visit.      Family History       Family History  Problem Relation Age of Onset   Stroke Mother     Breast cancer Mother     High blood pressure (Hypertension) Father     Diabetes Father          Social History        Tobacco Use  Smoking Status Former   Types: Cigarettes  Smokeless Tobacco Never      Social History  Social History         Socioeconomic History   Marital status: Single  Tobacco Use   Smoking status: Former      Types: Cigarettes  Smokeless tobacco: Never  Vaping Use   Vaping Use: Never used  Substance and Sexual Activity   Alcohol use: Yes   Drug use: Never        Objective:       Vitals:    07/04/22 0951  BP: 128/84  Pulse: 87  Temp: 37 C (98.6 F)  SpO2: 99%  Weight: (!) 105.2 kg (232 lb)  Height: 167.6 cm ('5\' 6"'$ )    Body mass index is 37.45 kg/m.   Physical Exam Skin:       Head:   Normocephalic and atraumatic.  Mouth/Throat: Oropharynx is clear and moist. No oropharyngeal exudate.  Eyes:   Conjunctivae are normal. Pupils are equal, round, and reactive to light. No scleral icterus.  Neck:   Normal range of motion. Neck supple. No tracheal deviation present. No thyromegaly present.  Cardiovascular: Normal rate, regular rhythm, normal heart sounds and intact distal pulses.  Exam reveals no gallop and no friction rub.   No murmur heard. Respiratory: Effort normal and breath sounds normal. No respiratory distress. No wheezes, rales or rhonchi.  No chest wall tenderness.  GI:       Soft. Bowel sounds are  normal. Abdomen is soft, non tender, non distended.  No masses or hepatosplenomegaly is present. There is no rebound and no guarding.  Musculoskeletal: . Extremities are non tender and without deformity.  Lymphadenopathy:   No cervical or axillary adenopathy.  Neurological: Alert and oriented to person, place, and time. Coordination normal.  Skin:    There is a scab on the right upper lateral lower leg.  This is approximately 4 cm below the popliteal crease around the level of the tibial tuberosity.  Skin is warm and dry. No rash noted. No diaphoresis. No erythema. No pallor.  Psychiatric: Normal mood and affect.Behavior is normal. Judgment and thought content normal.      Labs, Imaging and Diagnostic Testing: 07/26/21 plts 612k, gluc 117, o/w CBC and CMET normal.     Assessment and Plan:     Diagnoses and all orders for this visit:   Melanoma of lower leg, right (CMS-HCC)   Thrombocytosis     Pt has new diagnosis of pT1b cN0 right lower leg melanoma.  I recommend wide local excision with advancement flap closure and sentinel node biopsy.   I discussed with the patient that she will have recurrent numbness in this area.  She states the numbness from the knee replacement has significantly improved, but I advised her that the lower portion will likely be done permanently.  I discussed the process of the surgery including the sentinel node injection.  She will need nuclear medicine technetium 99 injection as well as methylene blue injection.   I reviewed that the main risks of the surgery are wound complications at the melanoma excision and seroma at the groin sentinel lymph node site.  I discussed that if the sentinel lymph node is positive, we will order a PET scan and refer to oncology.   Discussed additional risks of surgery such as heart or lung complications, blood clot, difficulties with range of motion, recurrent cancer.  I reviewed postoperative restrictions as well as recovery  expectations.  I advised the patient that I may need to put her in a knee immobilizer temporarily and that she may need to go back to physical therapy.   Patient would like to proceed at the first available opportunity.   Patient does still have thrombocytosis.  She had a work-up  for this around 15 years ago which was negative.  She takes aspirin for this.  We will hold aspirin for surgery for several days but will restart it as soon as possible.   No follow-ups on file. Georgianne Fick, MD

## 2022-07-21 NOTE — Progress Notes (Signed)
PCP - Randa Evens. Allwardt, PA  EKG - DOS  CPAP - Wears every night  ERAS Protcol - Clears until 0430  Anesthesia review: N  Patient verbally denies any shortness of breath, fever, cough and chest pain during phone call   -------------  SDW INSTRUCTIONS given:  Your procedure is scheduled on 07/22/22.  Report to [facility] Main Entrance "A" at 0530 A.M., and check in at the Admitting office.  Call this number if you have problems the morning of surgery:  [phone]   Remember:  Do not eat after midnight the night before your surgery  You may drink clear liquids until 0430 the morning of your surgery.   Clear liquids allowed are: Water, Non-Citrus Juices (without pulp), Carbonated Beverages, Clear Tea, Black Coffee Only, and Gatorade    Take these medicines the morning of surgery with A SIP OF WATER  N/A  As of today, STOP taking any Aspirin (unless otherwise instructed by your surgeon) Aleve, Naproxen, Ibuprofen, Motrin, Advil, Goody's, BC's, all herbal medications, fish oil, and all vitamins.                      Do not wear jewelry, make up, or nail polish            Do not wear lotions, powders, perfumes/colognes, or deodorant.            Do not shave 48 hours prior to surgery.  Men may shave face and neck.            Do not bring valuables to the hospital.            [facility] is not responsible for any belongings or valuables.  Do NOT Smoke (Tobacco/Vaping) 24 hours prior to your procedure If you use a CPAP at night, you may bring all equipment for your overnight stay.   Contacts, glasses, dentures or bridgework may not be worn into surgery.      For patients admitted to the hospital, discharge time will be determined by your treatment team.   Patients discharged the day of surgery will not be allowed to drive home, and someone needs to stay with them for 24 hours.    Special instructions:   [facility]- Preparing For Surgery  Before surgery, you can play an  important role. Because skin is not sterile, your skin needs to be as free of germs as possible. You can reduce the number of germs on your skin by washing with CHG (chlorahexidine gluconate) Soap before surgery.  CHG is an antiseptic cleaner which kills germs and bonds with the skin to continue killing germs even after washing.    Oral Hygiene is also important to reduce your risk of infection.  Remember - BRUSH YOUR TEETH THE MORNING OF SURGERY WITH YOUR REGULAR TOOTHPASTE  Please do not use if you have an allergy to CHG or antibacterial soaps. If your skin becomes reddened/irritated stop using the CHG.  Do not shave (including legs and underarms) for at least 48 hours prior to first CHG shower. It is OK to shave your face.  Please follow these instructions carefully.   Shower the NIGHT BEFORE SURGERY and the MORNING OF SURGERY with DIAL Soap.   Pat yourself dry with a CLEAN TOWEL.  Wear CLEAN PAJAMAS to bed the night before surgery  Place CLEAN SHEETS on your bed the night of your first shower and DO NOT SLEEP WITH PETS.   Day of Surgery:  Please shower morning of surgery  Wear Clean/Comfortable clothing the morning of surgery Do not apply any deodorants/lotions.   Remember to brush your teeth WITH YOUR REGULAR TOOTHPASTE.   Questions were answered. Patient verbalized understanding of instructions.      ]

## 2022-07-22 ENCOUNTER — Other Ambulatory Visit: Payer: Self-pay | Admitting: Physician Assistant

## 2022-07-22 ENCOUNTER — Ambulatory Visit (HOSPITAL_COMMUNITY): Payer: BC Managed Care – PPO | Admitting: Certified Registered"

## 2022-07-22 ENCOUNTER — Other Ambulatory Visit (HOSPITAL_COMMUNITY): Payer: Self-pay

## 2022-07-22 ENCOUNTER — Ambulatory Visit (HOSPITAL_COMMUNITY)
Admission: RE | Admit: 2022-07-22 | Discharge: 2022-07-22 | Disposition: A | Discharge status: Home / Self Care | Payer: BC Managed Care – PPO | Source: Ambulatory Visit | Attending: General Surgery | Admitting: General Surgery

## 2022-07-22 ENCOUNTER — Other Ambulatory Visit: Payer: Self-pay

## 2022-07-22 ENCOUNTER — Encounter (HOSPITAL_COMMUNITY): Payer: Self-pay | Admitting: General Surgery

## 2022-07-22 ENCOUNTER — Encounter (HOSPITAL_COMMUNITY)
Admission: RE | Disposition: A | Discharge status: Home / Self Care | Payer: Self-pay | Source: Home / Self Care | Attending: General Surgery

## 2022-07-22 ENCOUNTER — Ambulatory Visit (HOSPITAL_COMMUNITY)
Admission: RE | Admit: 2022-07-22 | Discharge: 2022-07-22 | Disposition: A | Discharge status: Home / Self Care | Payer: BC Managed Care – PPO | Attending: General Surgery | Admitting: General Surgery

## 2022-07-22 DIAGNOSIS — K219 Gastro-esophageal reflux disease without esophagitis: Secondary | ICD-10-CM | POA: Insufficient documentation

## 2022-07-22 DIAGNOSIS — I10 Essential (primary) hypertension: Secondary | ICD-10-CM | POA: Diagnosis not present

## 2022-07-22 DIAGNOSIS — G4733 Obstructive sleep apnea (adult) (pediatric): Secondary | ICD-10-CM | POA: Diagnosis not present

## 2022-07-22 DIAGNOSIS — Z6837 Body mass index (BMI) 37.0-37.9, adult: Secondary | ICD-10-CM | POA: Insufficient documentation

## 2022-07-22 DIAGNOSIS — Z79899 Other long term (current) drug therapy: Secondary | ICD-10-CM | POA: Insufficient documentation

## 2022-07-22 DIAGNOSIS — Z96651 Presence of right artificial knee joint: Secondary | ICD-10-CM | POA: Insufficient documentation

## 2022-07-22 DIAGNOSIS — C4371 Malignant melanoma of right lower limb, including hip: Secondary | ICD-10-CM

## 2022-07-22 DIAGNOSIS — E119 Type 2 diabetes mellitus without complications: Secondary | ICD-10-CM | POA: Insufficient documentation

## 2022-07-22 DIAGNOSIS — Z87891 Personal history of nicotine dependence: Secondary | ICD-10-CM | POA: Insufficient documentation

## 2022-07-22 DIAGNOSIS — M1990 Unspecified osteoarthritis, unspecified site: Secondary | ICD-10-CM | POA: Diagnosis not present

## 2022-07-22 DIAGNOSIS — G4730 Sleep apnea, unspecified: Secondary | ICD-10-CM | POA: Diagnosis not present

## 2022-07-22 DIAGNOSIS — Z9989 Dependence on other enabling machines and devices: Secondary | ICD-10-CM | POA: Diagnosis not present

## 2022-07-22 DIAGNOSIS — D0371 Melanoma in situ of right lower limb, including hip: Secondary | ICD-10-CM | POA: Diagnosis not present

## 2022-07-22 HISTORY — PX: EXCISION MELANOMA WITH SENTINEL LYMPH NODE BIOPSY: SHX5628

## 2022-07-22 HISTORY — DX: Sleep apnea, unspecified: G47.30

## 2022-07-22 LAB — COMPREHENSIVE METABOLIC PANEL
ALT: 51 U/L — ABNORMAL HIGH (ref 0–44)
AST: 37 U/L (ref 15–41)
Albumin: 4.4 g/dL (ref 3.5–5.0)
Alkaline Phosphatase: 57 U/L (ref 38–126)
Anion gap: 12 (ref 5–15)
BUN: 15 mg/dL (ref 6–20)
CO2: 25 mmol/L (ref 22–32)
Calcium: 9.6 mg/dL (ref 8.9–10.3)
Chloride: 102 mmol/L (ref 98–111)
Creatinine, Ser: 0.93 mg/dL (ref 0.44–1.00)
GFR, Estimated: >60 mL/min (ref >60)
Glucose, Bld: 154 mg/dL — ABNORMAL HIGH (ref 70–99)
Potassium: 3.7 mmol/L (ref 3.5–5.1)
Sodium: 139 mmol/L (ref 135–145)
Total Bilirubin: 0.4 mg/dL (ref 0.3–1.2)
Total Protein: 7.9 g/dL (ref 6.5–8.1)

## 2022-07-22 LAB — CBC WITH DIFFERENTIAL/PLATELET
Abs Immature Granulocytes: 0.01 10*3/uL (ref 0.00–0.07)
Basophils Absolute: 0.1 10*3/uL (ref 0.0–0.1)
Basophils Relative: 1 %
Eosinophils Absolute: 0.2 10*3/uL (ref 0.0–0.5)
Eosinophils Relative: 3 %
HCT: 43.7 % (ref 36.0–46.0)
Hemoglobin: 14.4 g/dL (ref 12.0–15.0)
Immature Granulocytes: 0 %
Lymphocytes Relative: 26 %
Lymphs Abs: 1.8 10*3/uL (ref 0.7–4.0)
MCH: 29.9 pg (ref 26.0–34.0)
MCHC: 33 g/dL (ref 30.0–36.0)
MCV: 90.7 fL (ref 80.0–100.0)
Monocytes Absolute: 0.7 10*3/uL (ref 0.1–1.0)
Monocytes Relative: 10 %
Neutro Abs: 4.3 10*3/uL (ref 1.7–7.7)
Neutrophils Relative %: 60 %
Platelets: 632 10*3/uL — ABNORMAL HIGH (ref 150–400)
RBC: 4.82 MIL/uL (ref 3.87–5.11)
RDW: 14.2 % (ref 11.5–15.5)
WBC: 7.1 10*3/uL (ref 4.0–10.5)
nRBC: 0 % (ref 0.0–0.2)

## 2022-07-22 SURGERY — EXCISION, MELANOMA, WITH SENTINEL LYMPH NODE BIOPSY
Anesthesia: General | Site: Leg Upper

## 2022-07-22 MED ORDER — CHLORHEXIDINE GLUCONATE CLOTH 2 % EX PADS: 6.0000 | MEDICATED_PAD | Freq: Once | CUTANEOUS | Status: DC

## 2022-07-22 MED ORDER — DEXAMETHASONE SODIUM PHOSPHATE 10 MG/ML IJ SOLN
INTRAMUSCULAR | Status: DC | PRN
  Administered 2022-07-22: 10 mg via INTRAVENOUS

## 2022-07-22 MED ORDER — LIDOCAINE 2% (20 MG/ML) 5 ML SYRINGE
INTRAMUSCULAR | Status: AC
  Filled 2022-07-22: qty 5

## 2022-07-22 MED ORDER — FENTANYL CITRATE (PF) 100 MCG/2ML IJ SOLN
INTRAMUSCULAR | Status: DC | PRN
  Administered 2022-07-22 (×3): 50 ug via INTRAVENOUS

## 2022-07-22 MED ORDER — LIDOCAINE HCL (PF) 1 % IJ SOLN
INTRAMUSCULAR | Status: AC
  Filled 2022-07-22: qty 30

## 2022-07-22 MED ORDER — AMISULPRIDE (ANTIEMETIC) 5 MG/2ML IV SOLN: 10.0000 mg | Freq: Once | INTRAVENOUS | Status: DC | PRN

## 2022-07-22 MED ORDER — KETOROLAC TROMETHAMINE 30 MG/ML IJ SOLN
INTRAMUSCULAR | Status: DC | PRN
  Administered 2022-07-22: 30 mg via INTRAVENOUS

## 2022-07-22 MED ORDER — BUPIVACAINE-EPINEPHRINE (PF) 0.25% -1:200000 IJ SOLN
INTRAMUSCULAR | Status: AC
  Filled 2022-07-22: qty 30

## 2022-07-22 MED ORDER — ACETAMINOPHEN 500 MG PO TABS: 1000.0000 mg | ORAL_TABLET | ORAL | Status: DC

## 2022-07-22 MED ORDER — PROPOFOL 10 MG/ML IV BOLUS
INTRAVENOUS | Status: AC
  Filled 2022-07-22: qty 20

## 2022-07-22 MED ORDER — PROPOFOL 10 MG/ML IV BOLUS
INTRAVENOUS | Status: DC | PRN
  Administered 2022-07-22: 150 mg via INTRAVENOUS
  Administered 2022-07-22: 30 mg via INTRAVENOUS

## 2022-07-22 MED ORDER — ONDANSETRON HCL 4 MG/2ML IJ SOLN
INTRAMUSCULAR | Status: DC | PRN
  Administered 2022-07-22 (×2): 4 mg via INTRAVENOUS

## 2022-07-22 MED ORDER — HYDROMORPHONE HCL 1 MG/ML IJ SOLN: 0.2500 mg | INTRAMUSCULAR | Status: DC | PRN

## 2022-07-22 MED ORDER — MAGTRACE LYMPHATIC TRACER
INTRAMUSCULAR | Status: DC | PRN
  Administered 2022-07-22: 2 mL via INTRAMUSCULAR

## 2022-07-22 MED ORDER — CHLORHEXIDINE GLUCONATE 0.12 % MT SOLN
OROMUCOSAL | Status: AC
  Administered 2022-07-22: 15 mL via OROMUCOSAL
  Filled 2022-07-22: qty 15

## 2022-07-22 MED ORDER — ORAL CARE MOUTH RINSE: 15.0000 mL | Freq: Once | OROMUCOSAL | Status: AC

## 2022-07-22 MED ORDER — ONDANSETRON HCL 4 MG/2ML IJ SOLN: 4.0000 mg | Freq: Once | INTRAMUSCULAR | Status: DC | PRN

## 2022-07-22 MED ORDER — OXYCODONE HCL 5 MG PO TABS: 5.0000 mg | ORAL_TABLET | Freq: Once | ORAL | Status: DC | PRN

## 2022-07-22 MED ORDER — PHENYLEPHRINE 80 MCG/ML (10ML) SYRINGE FOR IV PUSH (FOR BLOOD PRESSURE SUPPORT)
PREFILLED_SYRINGE | INTRAVENOUS | Status: DC | PRN
  Administered 2022-07-22 (×10): 80 ug via INTRAVENOUS

## 2022-07-22 MED ORDER — ONDANSETRON HCL 4 MG/2ML IJ SOLN
INTRAMUSCULAR | Status: AC
  Filled 2022-07-22: qty 2

## 2022-07-22 MED ORDER — MIDAZOLAM HCL 5 MG/5ML IJ SOLN
INTRAMUSCULAR | Status: DC | PRN
  Administered 2022-07-22 (×2): 1 mg via INTRAVENOUS

## 2022-07-22 MED ORDER — CHLORHEXIDINE GLUCONATE 0.12 % MT SOLN: 15.0000 mL | Freq: Once | OROMUCOSAL | Status: AC

## 2022-07-22 MED ORDER — OXYCODONE HCL 5 MG PO TABS
5.0000 mg | ORAL_TABLET | Freq: Four times a day (QID) | ORAL | 0 refills | Status: DC | PRN
  Filled 2022-07-22: qty 10, 3d supply, fill #0

## 2022-07-22 MED ORDER — METHYLENE BLUE 1 % INJ SOLN
INTRAVENOUS | Status: AC
  Filled 2022-07-22: qty 10

## 2022-07-22 MED ORDER — LIDOCAINE 2% (20 MG/ML) 5 ML SYRINGE
INTRAMUSCULAR | Status: DC | PRN
  Administered 2022-07-22: 60 mg via INTRAVENOUS

## 2022-07-22 MED ORDER — KETOROLAC TROMETHAMINE 30 MG/ML IJ SOLN
INTRAMUSCULAR | Status: AC
  Filled 2022-07-22: qty 1

## 2022-07-22 MED ORDER — LACTATED RINGERS IV SOLN: INTRAVENOUS | Status: DC

## 2022-07-22 MED ORDER — MIDAZOLAM HCL 2 MG/2ML IJ SOLN
INTRAMUSCULAR | Status: AC
  Filled 2022-07-22: qty 2

## 2022-07-22 MED ORDER — METHYLENE BLUE 1 % INJ SOLN
INTRAVENOUS | Status: DC | PRN
  Administered 2022-07-22: 10 mL

## 2022-07-22 MED ORDER — OXYCODONE HCL 5 MG/5ML PO SOLN: 5.0000 mg | Freq: Once | ORAL | Status: DC | PRN

## 2022-07-22 MED ORDER — 0.9 % SODIUM CHLORIDE (POUR BTL) OPTIME
TOPICAL | Status: DC | PRN
  Administered 2022-07-22: 1000 mL

## 2022-07-22 MED ORDER — CEFAZOLIN SODIUM-DEXTROSE 2-4 GM/100ML-% IV SOLN
2.0000 g | INTRAVENOUS | Status: AC
  Administered 2022-07-22: 2 g via INTRAVENOUS
  Filled 2022-07-22: qty 100

## 2022-07-22 MED ORDER — FENTANYL CITRATE (PF) 250 MCG/5ML IJ SOLN
INTRAMUSCULAR | Status: AC
  Filled 2022-07-22: qty 5

## 2022-07-22 MED ORDER — FENTANYL CITRATE (PF) 100 MCG/2ML IJ SOLN
INTRAMUSCULAR | Status: AC
  Filled 2022-07-22: qty 2

## 2022-07-22 MED ORDER — ACETAMINOPHEN 500 MG PO TABS
1000.0000 mg | ORAL_TABLET | Freq: Once | ORAL | Status: AC
  Administered 2022-07-22: 1000 mg via ORAL
  Filled 2022-07-22: qty 2

## 2022-07-22 MED ORDER — TECHNETIUM TC 99M TILMANOCEPT KIT
500.0000 | PACK | Freq: Once | INTRAVENOUS | Status: AC | PRN
  Administered 2022-07-22: 500 via INTRADERMAL

## 2022-07-22 MED ORDER — LIDOCAINE HCL 1 % IJ SOLN
INTRAMUSCULAR | Status: DC | PRN
  Administered 2022-07-22: 36 mL

## 2022-07-22 SURGICAL SUPPLY — 45 items
BAG COUNTER SPONGE SURGICOUNT (BAG) ×1 IMPLANT
BENZOIN TINCTURE PRP APPL 2/3 (GAUZE/BANDAGES/DRESSINGS) IMPLANT
BNDG COHESIVE 4X5 TAN STRL LF (GAUZE/BANDAGES/DRESSINGS) IMPLANT
CANISTER SUCT 3000ML PPV (MISCELLANEOUS) ×1 IMPLANT
CHLORAPREP W/TINT 26 (MISCELLANEOUS) ×1 IMPLANT
CLIP VESOCCLUDE MED 24/CT (CLIP) ×1 IMPLANT
CNTNR URN SCR LID CUP LEK RST (MISCELLANEOUS) ×3 IMPLANT
CONT SPEC 4OZ STRL OR WHT (MISCELLANEOUS) ×3
COVER PROBE W GEL 5X96 (DRAPES) ×1 IMPLANT
COVER SURGICAL LIGHT HANDLE (MISCELLANEOUS) ×1 IMPLANT
DERMABOND ADVANCED .7 DNX12 (GAUZE/BANDAGES/DRESSINGS) IMPLANT
DRAPE HALF SHEET 40X57 (DRAPES) ×1 IMPLANT
DRAPE LAPAROSCOPIC ABDOMINAL (DRAPES) ×1 IMPLANT
DRSG TEGADERM 4X4.75 (GAUZE/BANDAGES/DRESSINGS) ×2 IMPLANT
ELECT REM PT RETURN 9FT ADLT (ELECTROSURGICAL) ×1
ELECTRODE REM PT RTRN 9FT ADLT (ELECTROSURGICAL) ×1 IMPLANT
GAUZE SPONGE 2X2 8PLY STRL LF (GAUZE/BANDAGES/DRESSINGS) ×1 IMPLANT
GLOVE BIO SURGEON STRL SZ 6 (GLOVE) ×1 IMPLANT
GLOVE INDICATOR 6.5 STRL GRN (GLOVE) ×1 IMPLANT
GOWN STRL REUS W/ TWL LRG LVL3 (GOWN DISPOSABLE) ×2 IMPLANT
GOWN STRL REUS W/TWL 2XL LVL3 (GOWN DISPOSABLE) ×2 IMPLANT
GOWN STRL REUS W/TWL LRG LVL3 (GOWN DISPOSABLE) ×1
KIT BASIN OR (CUSTOM PROCEDURE TRAY) ×1 IMPLANT
KIT TURNOVER KIT B (KITS) ×1 IMPLANT
MARKER SKIN DUAL TIP RULER LAB (MISCELLANEOUS) ×1 IMPLANT
NDL 18GX1X1/2 (RX/OR ONLY) (NEEDLE) ×1 IMPLANT
NDL HYPO 25GX1X1/2 BEV (NEEDLE) ×1 IMPLANT
NEEDLE 18GX1X1/2 (RX/OR ONLY) (NEEDLE) ×1 IMPLANT
NEEDLE HYPO 25GX1X1/2 BEV (NEEDLE) ×2 IMPLANT
NS IRRIG 1000ML POUR BTL (IV SOLUTION) ×1 IMPLANT
PACK GENERAL/GYN (CUSTOM PROCEDURE TRAY) ×1 IMPLANT
PAD ARMBOARD 7.5X6 YLW CONV (MISCELLANEOUS) ×2 IMPLANT
STOCKINETTE IMPERVIOUS LG (DRAPES) IMPLANT
STRIP CLOSURE SKIN 1/2X4 (GAUZE/BANDAGES/DRESSINGS) ×1 IMPLANT
STRIP CLOSURE SKIN 1/4X4 (GAUZE/BANDAGES/DRESSINGS) IMPLANT
SUT ETHILON 2 0 PSLX (SUTURE) IMPLANT
SUT MNCRL AB 4-0 PS2 18 (SUTURE) ×1 IMPLANT
SUT SILK 2 0 SH (SUTURE) IMPLANT
SUT VIC AB 2-0 SH 27 (SUTURE) ×3
SUT VIC AB 2-0 SH 27XBRD (SUTURE) ×2 IMPLANT
SUT VIC AB 3-0 SH 27 (SUTURE) ×3
SUT VIC AB 3-0 SH 27X BRD (SUTURE) ×2 IMPLANT
SYR CONTROL 10ML LL (SYRINGE) ×2 IMPLANT
TOWEL GREEN STERILE (TOWEL DISPOSABLE) ×1 IMPLANT
TRACER MAGTRACE VIAL (MISCELLANEOUS) IMPLANT

## 2022-07-22 NOTE — Transfer of Care (Signed)
Immediate Anesthesia Transfer of Care Note  Patient: [name]  Procedure(s) Performed: WIDE LOCAL EXCISION RIGHT SHIN MELANOMA WITH ADVANCEMENT FLAP CLOSURE WITH SENTINEL NODE BIOPSY (Leg Upper)  Patient Location: PACU  Anesthesia Type:General  Level of Consciousness: drowsy and patient cooperative  Airway & Oxygen Therapy: Patient Spontanous Breathing  Post-op Assessment: Report given to RN and Post -op Vital signs reviewed and stable  Post vital signs: Reviewed and stable  Last Vitals:  Vitals Value Taken Time  BP 126/68 07/22/22 0909  Temp    Pulse 90 07/22/22 0914  Resp 15 07/22/22 0914  SpO2 92 % 07/22/22 0914  Vitals shown include unvalidated device data.  Last Pain:  Vitals:   07/22/22 0618  TempSrc:   PainSc: 0-No pain         Complications: No notable events documented.

## 2022-07-22 NOTE — Anesthesia Postprocedure Evaluation (Signed)
Anesthesia Post Note  Patient: [name]  Procedure(s) Performed: WIDE LOCAL EXCISION RIGHT SHIN MELANOMA WITH ADVANCEMENT FLAP CLOSURE WITH SENTINEL NODE BIOPSY (Leg Upper)     Patient location during evaluation: PACU Anesthesia Type: General Level of consciousness: awake and alert, oriented and patient cooperative Pain management: pain level controlled Vital Signs Assessment: post-procedure vital signs reviewed and stable Respiratory status: spontaneous breathing, nonlabored ventilation and respiratory function stable Cardiovascular status: blood pressure returned to baseline and stable Postop Assessment: no apparent nausea or vomiting Anesthetic complications: no   No notable events documented.  Last Vitals:  Vitals:   07/22/22 0930 07/22/22 0945  BP: 131/71 137/81  Pulse: 90   Resp: 17   Temp:    SpO2: 93%     Last Pain:  Vitals:   07/22/22 0945  TempSrc:   PainSc: 0-No pain                 Pervis Hocking

## 2022-07-22 NOTE — Anesthesia Preprocedure Evaluation (Addendum)
Anesthesia Evaluation  Patient identified by MRN, date of birth, ID band Patient awake    Reviewed: Allergy & Precautions, NPO status , Patient's Chart, lab work & pertinent test results  Airway Mallampati: III  TM Distance: >3 FB Neck ROM: Full    Dental  (+) Dental Advisory Given, Teeth Intact   Pulmonary sleep apnea and Continuous Positive Airway Pressure Ventilation , former smoker,    Pulmonary exam normal breath sounds clear to auscultation       Cardiovascular hypertension (144/76 preop), Pt. on medications Normal cardiovascular exam Rhythm:Regular Rate:Normal     Neuro/Psych negative neurological ROS  negative psych ROS   GI/Hepatic Neg liver ROS, GERD  Controlled and Medicated,  Endo/Other  Obesity BMI 37  Renal/GU negative Renal ROS  negative genitourinary   Musculoskeletal  (+) Arthritis , Osteoarthritis,    Abdominal (+) + obese,   Peds negative pediatric ROS (+)  Hematology negative hematology ROS (+)   Anesthesia Other Findings Right shin melanoma   Reproductive/Obstetrics negative OB ROS                            Anesthesia Physical Anesthesia Plan  ASA: 3  Anesthesia Plan: General   Post-op Pain Management: Tylenol PO (pre-op)* and Toradol IV (intra-op)*   Induction: Intravenous  PONV Risk Score and Plan: 3 and Ondansetron, Dexamethasone, Midazolam and Treatment may vary due to age or medical condition  Airway Management Planned: LMA  Additional Equipment: None  Intra-op Plan:   Post-operative Plan: Extubation in OR  Informed Consent: I have reviewed the patients History and Physical, chart, labs and discussed the procedure including the risks, benefits and alternatives for the proposed anesthesia with the patient or authorized representative who has indicated his/her understanding and acceptance.     Dental advisory given  Plan Discussed with:  CRNA  Anesthesia Plan Comments:        Anesthesia Quick Evaluation

## 2022-07-22 NOTE — Anesthesia Procedure Notes (Signed)
Procedure Name: LMA Insertion Date/Time: 07/22/2022 7:45 AM  Performed by: Gwyndolyn Saxon, CRNAPre-anesthesia Checklist: Patient identified, Emergency Drugs available, Suction available and Patient being monitored Patient Re-evaluated:Patient Re-evaluated prior to induction Oxygen Delivery Method: Circle system utilized Preoxygenation: Pre-oxygenation with 100% oxygen Induction Type: IV induction Ventilation: Mask ventilation without difficulty LMA: LMA inserted LMA Size: 4.0 Number of attempts: 1 Placement Confirmation: positive ETCO2 and breath sounds checked- equal and bilateral Tube secured with: Tape Dental Injury: Teeth and Oropharynx as per pre-operative assessment

## 2022-07-22 NOTE — Op Note (Addendum)
PRE-OPERATIVE DIAGNOSIS: cT1bN0 right lower leg melanoma  POST-OPERATIVE DIAGNOSIS:  Same  PROCEDURE:  Procedure(s): Wide local excision 1 cm margins, advancement flap closure for defect 4x6 cm, placement of Supra SDRM, right inguinal sentinel lymph node mapping and biopsy  SURGEON:  Surgeon(s): Stark Klein, MD  ASSIST:  Celene Squibb, RNFA  ANESTHESIA:   local and general  DRAINS: none   LOCAL MEDICATIONS USED:  MARCAINE    and XYLOCAINE   SPECIMEN:  Source of Specimen:  Two right inguinal sentinel lymph nodes, wide local excision right lower leg melanoma   FINDINGS:  no residual pigment.  Good mapping of blue dye and Tc99  DISPOSITION OF SPECIMEN:  PATHOLOGY  COUNTS:  YES  PLAN OF CARE: Discharge to home after PACU  PATIENT DISPOSITION:  PACU - hemodynamically stable.    PROCEDURE:   Pt was identified in the holding area, taken to the OR, and placed supine on the OR table.  General anesthesia was induced.  Time out was performed according to the surgical safety checklist.  When all was correct, we continued.  The right leg and groin were prepped and draped in sterile fashion.  The melanoma was identified and 1 cm margins were marked out.  One mL methylene blue was injected intradermally around the melanoma biopsy site.    Local was administered under the melanoma and the adjacent tissue.  A #10 blade was used to incise the skin around the melanoma.  The cautery was used to take the dissection down to the fascia.  The skin was marked in situ with orientation sutures.  The cautery was used to take the specimen off the fascia, and it was passed off the table.    Skin hooks were used to elevate the edges of the incision and the skin was freed up in all directions.  This was pulled together in an vertical orientation. The skin was pulled together to check the tension. Supra SDRM placed in the defect to speed up wound growth. Deep interrupted 2-0 vicryl sutures were placed to  relieve tension.  The skin was then reapproximated with 3-0 interrupted vicryl deep dermal sutures and 4-0 monocryl running subcuticular sutures.  Three 2-0 nylon horizontal mattress sutures were placed as well.    The point of maximum signal intensity was identified with the neoprobe. Unfortunately, a node was identified above the inguinal ligament and one below the inguinal ligament. These were too far apart to get with 1 incision.  The lower one was addressed first.   A 2 cm incision was made with a #15 blade.  The subcutaneous tissues were divided with the cautery.  A Weitlaner retractor was used to assist with visualization.  The tonsil clamp was used to bluntly dissect the tissue.  A blue node was identified and elevated with an allis clamp.  The lymphovascular channels were clipped with hemoclips.  The node was passed off.  Hemostasis was achieved with the cautery.  This wound was irrigated and closed with 3-0 Vicryl deep dermal interrupted sutures and 4-0 Monocryl running subcuticular suture.  The node above the inguinal ligament was then addressed similarly.   A 2.5 cm incision was made in this location and the remaining portion was continued as before.   The melanoma site was cleaned, dried, and dressed with Benzoin, steristrips, gauze, and tegaderm.  The lower leg was loosely wrapped with a coban.   Needle, sponge, and instrument counts were correct.  The patient was awakened from anesthesia and taken  to the PACU in stable condition.

## 2022-07-22 NOTE — Discharge Instructions (Addendum)
    [place] Office Phone Number [phone]   POST OP INSTRUCTIONS  Always review your discharge instruction sheet given to you by the facility where your surgery was performed.  IF YOU HAVE DISABILITY OR FAMILY LEAVE FORMS, YOU MUST BRING THEM TO THE OFFICE FOR PROCESSING.  DO NOT GIVE THEM TO YOUR DOCTOR.  Take 2 tylenol (acetominophen) three times a day for 3 days.  If you still have pain, add ibuprofen with food in between if able to take this (if you have kidney issues or stomach issues, do not take ibuprofen).  If both of those are not enough, add the narcotic pain pill.  If you find you are needing a lot of this overnight after surgery, call the next morning for a refill.   Take your usually prescribed medications unless otherwise directed If you need a refill on your pain medication, please contact your pharmacy.  They will contact our office to request authorization.  Prescriptions will not be filled after 5pm or on week-ends. You should eat very light the first 24 hours after surgery, such as soup, crackers, pudding, etc.  Resume your normal diet the day after surgery It is common to experience some constipation if taking pain medication after surgery.  Increasing fluid intake and taking a stool softener will usually help or prevent this problem from occurring.  A mild laxative (Milk of Magnesia or Miralax) should be taken according to package directions if there are no bowel movements after 48 hours. You may shower in 48 hours.  Then remove dressing.  Ok to shower with dressing off after this.  OK to leave off the outer dressing, but if the suture tails are uncomfortable, cover with gauze/bandaid. The surgical glue on the inguinal incisions will flake off in 2-3 weeks.   ACTIVITIES:  No strenuous activity or heavy lifting for 1 week.   You may drive when you no longer are taking prescription pain medication, you can comfortably wear a seatbelt, and you can safely  maneuver your car and apply brakes. RETURN TO WORK:  ____As tolerated if no strenuous activity or high burden of walking. __ 7. You should see your doctor in the office for a follow-up appointment approximately two weeks after your surgery.    WHEN TO CALL YOUR DOCTOR: Fever over 101.0 Nausea and/or vomiting. Extreme swelling or bruising. Continued bleeding from incision. Increased pain, redness, or drainage from the incision.  The clinic staff is available to answer your questions during regular business hours.  Please don't hesitate to call and ask to speak to one of the nurses for clinical concerns.  If you have a medical emergency, go to the nearest emergency room or call 911.  A surgeon from [facility] is always on call at the hospital.  For further questions, please visit [email or web address]

## 2022-07-22 NOTE — Interval H&P Note (Signed)
History and Physical Interval Note:  07/22/2022 7:27 AM  [name]  has presented today for surgery, with the diagnosis of RIGHT SHIN MELANOMA.  The various methods of treatment have been discussed with the patient and family. After consideration of risks, benefits and other options for treatment, the patient has consented to  Procedure(s): WIDE LOCAL EXCISION RIGHT SHIN MELANOMA WITH ADVANCEMENT FLAP CLOSURE WITH SENTINEL NODE BIOPSY (N/A) as a surgical intervention.  The patient's history has been reviewed, patient examined, no change in status, stable for surgery.  I have reviewed the patient's chart and labs.  Questions were answered to the patient's satisfaction.     Stark Klein

## 2022-07-23 ENCOUNTER — Encounter (HOSPITAL_COMMUNITY): Payer: Self-pay | Admitting: General Surgery

## 2022-07-23 ENCOUNTER — Other Ambulatory Visit (HOSPITAL_COMMUNITY): Payer: Self-pay

## 2022-07-23 MED ORDER — PANTOPRAZOLE SODIUM 20 MG PO TBEC
DELAYED_RELEASE_TABLET | Freq: Every day | ORAL | 3 refills | Status: DC
  Filled 2022-07-23: qty 90, 90d supply, fill #0
  Filled 2022-10-22: qty 90, 90d supply, fill #1
  Filled 2023-01-25: qty 90, 90d supply, fill #2
  Filled 2023-04-30: qty 90, 90d supply, fill #3

## 2022-07-23 MED ORDER — KETOCONAZOLE 2 % EX CREA
TOPICAL_CREAM | CUTANEOUS | 2 refills | Status: DC
  Filled 2022-07-23: qty 30, 30d supply, fill #0

## 2022-07-23 MED ORDER — AMLODIPINE BESYLATE 10 MG PO TABS
10.0000 mg | ORAL_TABLET | Freq: Every day | ORAL | 3 refills | Status: DC
  Filled 2022-07-23: qty 90, 90d supply, fill #0
  Filled 2022-10-30 – 2022-10-31 (×3): qty 90, 90d supply, fill #1
  Filled 2023-01-25 – 2023-01-29 (×2): qty 90, 90d supply, fill #2
  Filled 2023-04-30: qty 90, 90d supply, fill #3

## 2022-07-23 MED ORDER — BENAZEPRIL HCL 10 MG PO TABS
10.0000 mg | ORAL_TABLET | Freq: Every day | ORAL | 3 refills | Status: DC
  Filled 2022-07-23: qty 90, 90d supply, fill #0
  Filled 2022-10-30: qty 90, 90d supply, fill #1
  Filled 2023-01-25: qty 90, 90d supply, fill #2
  Filled 2023-04-30: qty 90, 90d supply, fill #3

## 2022-07-25 ENCOUNTER — Other Ambulatory Visit (HOSPITAL_COMMUNITY): Payer: Self-pay

## 2022-07-25 LAB — SURGICAL PATHOLOGY

## 2022-07-26 ENCOUNTER — Encounter: Payer: Self-pay | Admitting: Obstetrics & Gynecology

## 2022-07-28 ENCOUNTER — Encounter: Payer: Self-pay | Admitting: *Deleted

## 2022-07-28 ENCOUNTER — Other Ambulatory Visit (HOSPITAL_COMMUNITY): Payer: Self-pay

## 2022-07-28 MED ORDER — NYSTATIN-TRIAMCINOLONE 100000-0.1 UNIT/GM-% EX CREA
1.0000 | TOPICAL_CREAM | Freq: Two times a day (BID) | CUTANEOUS | 0 refills | Status: DC
  Filled 2022-07-28: qty 30, 15d supply, fill #0

## 2022-07-28 NOTE — Telephone Encounter (Signed)
Dr.Lavoie replied "Agree,  Dr Dellis Filbert "

## 2022-09-16 DIAGNOSIS — H5203 Hypermetropia, bilateral: Secondary | ICD-10-CM | POA: Diagnosis not present

## 2022-09-16 DIAGNOSIS — H04123 Dry eye syndrome of bilateral lacrimal glands: Secondary | ICD-10-CM | POA: Diagnosis not present

## 2022-10-16 ENCOUNTER — Ambulatory Visit (INDEPENDENT_AMBULATORY_CARE_PROVIDER_SITE_OTHER): Payer: BC Managed Care – PPO

## 2022-10-16 DIAGNOSIS — Z23 Encounter for immunization: Secondary | ICD-10-CM

## 2022-10-16 NOTE — Progress Notes (Signed)
Pt in office and received influenza vaccine in right deltoid with no issues

## 2022-10-22 ENCOUNTER — Other Ambulatory Visit (HOSPITAL_COMMUNITY): Payer: Self-pay

## 2022-10-30 ENCOUNTER — Other Ambulatory Visit: Payer: Self-pay

## 2022-10-31 ENCOUNTER — Other Ambulatory Visit (HOSPITAL_COMMUNITY): Payer: Self-pay

## 2022-10-31 ENCOUNTER — Other Ambulatory Visit: Payer: Self-pay

## 2022-11-09 ENCOUNTER — Other Ambulatory Visit: Payer: Self-pay | Admitting: Obstetrics & Gynecology

## 2022-11-09 DIAGNOSIS — R238 Other skin changes: Secondary | ICD-10-CM

## 2022-11-11 ENCOUNTER — Other Ambulatory Visit (HOSPITAL_COMMUNITY): Payer: Self-pay

## 2022-11-11 MED ORDER — NYSTATIN-TRIAMCINOLONE 100000-0.1 UNIT/GM-% EX CREA
1.0000 | TOPICAL_CREAM | Freq: Two times a day (BID) | CUTANEOUS | 0 refills | Status: DC
  Filled 2022-11-11: qty 30, 15d supply, fill #0

## 2022-11-11 NOTE — Telephone Encounter (Signed)
Last AEX 12/23/2021--scheduled for 12/24/2022.  Rxd in past for rash under belly.

## 2022-11-13 ENCOUNTER — Other Ambulatory Visit (HOSPITAL_BASED_OUTPATIENT_CLINIC_OR_DEPARTMENT_OTHER): Payer: Self-pay

## 2022-11-13 ENCOUNTER — Telehealth: Payer: Self-pay | Admitting: Pulmonary Disease

## 2022-11-13 DIAGNOSIS — G4733 Obstructive sleep apnea (adult) (pediatric): Secondary | ICD-10-CM

## 2022-11-13 NOTE — Telephone Encounter (Signed)
Pt called because she has not received any CPAP supplies from [place]. I sent in an order to [facility] and nothing further needed.

## 2022-11-13 NOTE — Telephone Encounter (Signed)
Also asking if she would be better off w/[facility].

## 2022-11-13 NOTE — Telephone Encounter (Signed)
PT frustrated getting her Cpap supplies thru [facility]. They said they sent over ppwk but she is not sure she believes them. Pls call PT to advise. Also sees Volanda Napoleon here.   [phone]

## 2022-11-17 ENCOUNTER — Telehealth: Payer: Self-pay | Admitting: Pulmonary Disease

## 2022-11-17 NOTE — Telephone Encounter (Signed)
Order was placed 11/13/22 for pt to receive supplies for cpap from [place].  PCCs, please advise on this.

## 2022-11-17 NOTE — Telephone Encounter (Signed)
[place]    Sending a message to [facility] now     Called and spoke with pt letting her know that [facility] are reaching out to DME to see where we stand with the order for supplies and she verbalized understanding.

## 2022-11-19 DIAGNOSIS — L570 Actinic keratosis: Secondary | ICD-10-CM | POA: Diagnosis not present

## 2022-11-19 DIAGNOSIS — D0461 Carcinoma in situ of skin of right upper limb, including shoulder: Secondary | ICD-10-CM | POA: Diagnosis not present

## 2022-11-19 DIAGNOSIS — L814 Other melanin hyperpigmentation: Secondary | ICD-10-CM | POA: Diagnosis not present

## 2022-11-19 DIAGNOSIS — Z85820 Personal history of malignant melanoma of skin: Secondary | ICD-10-CM | POA: Diagnosis not present

## 2022-11-19 DIAGNOSIS — D1801 Hemangioma of skin and subcutaneous tissue: Secondary | ICD-10-CM | POA: Diagnosis not present

## 2022-11-19 DIAGNOSIS — L821 Other seborrheic keratosis: Secondary | ICD-10-CM | POA: Diagnosis not present

## 2022-11-20 NOTE — Telephone Encounter (Signed)
The CMN has been signed in [employer]

## 2022-11-21 DIAGNOSIS — G4733 Obstructive sleep apnea (adult) (pediatric): Secondary | ICD-10-CM | POA: Diagnosis not present

## 2022-12-15 DIAGNOSIS — Z1231 Encounter for screening mammogram for malignant neoplasm of breast: Secondary | ICD-10-CM | POA: Diagnosis not present

## 2022-12-17 ENCOUNTER — Encounter: Payer: Self-pay | Admitting: Obstetrics & Gynecology

## 2022-12-24 ENCOUNTER — Ambulatory Visit (INDEPENDENT_AMBULATORY_CARE_PROVIDER_SITE_OTHER): Payer: BC Managed Care – PPO | Admitting: Obstetrics & Gynecology

## 2022-12-24 ENCOUNTER — Encounter: Payer: Self-pay | Admitting: Obstetrics & Gynecology

## 2022-12-24 VITALS — BP 128/80 | HR 94 | Ht 64.75 in | Wt 233.0 lb

## 2022-12-24 DIAGNOSIS — Z01419 Encounter for gynecological examination (general) (routine) without abnormal findings: Secondary | ICD-10-CM | POA: Diagnosis not present

## 2022-12-24 DIAGNOSIS — M85851 Other specified disorders of bone density and structure, right thigh: Secondary | ICD-10-CM

## 2022-12-24 DIAGNOSIS — Z780 Asymptomatic menopausal state: Secondary | ICD-10-CM

## 2022-12-24 DIAGNOSIS — E6609 Other obesity due to excess calories: Secondary | ICD-10-CM

## 2022-12-24 NOTE — Progress Notes (Signed)
[name] 1966/01/22 [id number]   History:    57 y.o. G3P3L3 Widowed x 7 1/2 yrs.  Has 2 grand-daughters.   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  No postmenopausal bleeding.  No pelvic pain.  Abstinent.  Pap Neg 12/2020.  Will repeat Pap at 3 years. Urine and bowel movements normal. Breasts normal.  Mammo Neg 12/2022.  Mother Dxed with Breast Ca at age 104.  Body mass index 39.07. Planning to walk and do the recumbent bike.  Low carb diet.  Health labs with family physician. Colono 2014, will schedule this year. BD very mild Osteopenia in 11/2017, repeat here now. Flu vaccine done at pcp.   Past medical history,surgical history, family history and social history were all reviewed and documented in the EPIC chart.  Gynecologic History Patient's last menstrual period was 12/24/2015 (lmp unknown).  Obstetric History OB History  Gravida Para Term Preterm AB Living  3 3 3     3  $ SAB IAB Ectopic Multiple Live Births          3    # Outcome Date GA Lbr Len/2nd Weight Sex Delivery Anes PTL Lv  3 Term     M Vag-Spont  N LIV  2 Term     F Vag-Spont  N LIV  1 Term     F Vag-Spont  N LIV     ROS: A ROS was performed and pertinent positives and negatives are included in the history. GENERAL: No fevers or chills. HEENT: No change in vision, no earache, sore throat or sinus congestion. NECK: No pain or stiffness. CARDIOVASCULAR: No chest pain or pressure. No palpitations. PULMONARY: No shortness of breath, cough or wheeze. GASTROINTESTINAL: No abdominal pain, nausea, vomiting or diarrhea, melena or bright red blood per rectum. GENITOURINARY: No urinary frequency, urgency, hesitancy or dysuria. MUSCULOSKELETAL: No joint or muscle pain, no back pain, no recent trauma. DERMATOLOGIC: No rash, no itching, no lesions. ENDOCRINE: No polyuria, polydipsia, no heat or cold intolerance. No recent change in  weight. HEMATOLOGICAL: No anemia or easy bruising or bleeding. NEUROLOGIC: No headache, seizures, numbness, tingling or weakness. PSYCHIATRIC: No depression, no loss of interest in normal activity or change in sleep pattern.     Exam:   BP 128/80   Pulse 94   Ht 5' 4.75" (1.645 m)   Wt 233 lb (105.7 kg)   LMP 12/24/2015 (LMP Unknown) Comment: bso-not sexually  SpO2 97%   BMI 39.07 kg/m   Body mass index is 39.07 kg/m.  General appearance : Well developed well nourished female. No acute distress HEENT: Eyes: no retinal hemorrhage or exudates,  Neck supple, trachea midline, no carotid bruits, no thyroidmegaly Lungs: Clear to auscultation, no rhonchi or wheezes, or rib retractions  Heart: Regular rate and rhythm, no murmurs or gallops Breast:Examined in sitting and supine position were symmetrical in appearance, no palpable masses or tenderness,  no skin retraction, no nipple inversion, no nipple discharge, no skin discoloration, no axillary or supraclavicular lymphadenopathy Abdomen: no palpable masses or tenderness, no rebound or guarding Extremities: no edema or skin discoloration or tenderness  Pelvic: Vulva: Normal             Vagina: No gross lesions or discharge  Cervix: No gross lesions or discharge  Uterus  AV, normal size, shape and consistency, non-tender and mobile  Adnexa  Without masses or tenderness  Anus: Normal   Assessment/Plan:  57 y.o. female for annual exam   1. 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  No postmenopausal bleeding.  No pelvic pain.  Abstinent.  Pap Neg 12/2020.  Will repeat Pap at 3 years. Urine and bowel movements normal. Breasts normal.  Mammo Neg 12/2022.  Mother Dxed with Breast Ca at age 83.  Body mass index 39.07. Planning to walk and do the recumbent bike.  Low carb diet.  Health labs with family physician. Colono 2014, will schedule this year. BD very mild  Osteopenia in 11/2017, repeat here now. Flu vaccine done at pcp.  2. 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  No postmenopausal bleeding.  No pelvic pain.  Abstinent.  3. Osteopenia of neck of right femur BD very mild Osteopenia in 11/2017, repeat here now. - DG Bone Density; Future  4. Class 2 obesity due to excess calories without serious comorbidity with body mass index (BMI) of 39.0 to 39.9 in adult  Body mass index 39.07. Planning to walk and do the recumbent bike.  Low carb diet.   Princess Bruins MD, 2:26 PM

## 2023-01-25 ENCOUNTER — Other Ambulatory Visit: Payer: Self-pay

## 2023-01-26 ENCOUNTER — Other Ambulatory Visit: Payer: Self-pay

## 2023-01-29 ENCOUNTER — Other Ambulatory Visit: Payer: Self-pay

## 2023-02-03 ENCOUNTER — Ambulatory Visit (INDEPENDENT_AMBULATORY_CARE_PROVIDER_SITE_OTHER): Payer: BC Managed Care – PPO

## 2023-02-03 ENCOUNTER — Other Ambulatory Visit: Payer: Self-pay | Admitting: Obstetrics & Gynecology

## 2023-02-03 DIAGNOSIS — Z13820 Encounter for screening for osteoporosis: Secondary | ICD-10-CM

## 2023-02-03 DIAGNOSIS — Z780 Asymptomatic menopausal state: Secondary | ICD-10-CM

## 2023-02-03 DIAGNOSIS — E6609 Other obesity due to excess calories: Secondary | ICD-10-CM

## 2023-02-03 DIAGNOSIS — M8589 Other specified disorders of bone density and structure, multiple sites: Secondary | ICD-10-CM

## 2023-02-03 DIAGNOSIS — M85851 Other specified disorders of bone density and structure, right thigh: Secondary | ICD-10-CM

## 2023-02-03 DIAGNOSIS — Z01419 Encounter for gynecological examination (general) (routine) without abnormal findings: Secondary | ICD-10-CM

## 2023-02-18 DIAGNOSIS — D225 Melanocytic nevi of trunk: Secondary | ICD-10-CM | POA: Diagnosis not present

## 2023-02-18 DIAGNOSIS — L821 Other seborrheic keratosis: Secondary | ICD-10-CM | POA: Diagnosis not present

## 2023-02-18 DIAGNOSIS — Z85820 Personal history of malignant melanoma of skin: Secondary | ICD-10-CM | POA: Diagnosis not present

## 2023-02-18 DIAGNOSIS — L814 Other melanin hyperpigmentation: Secondary | ICD-10-CM | POA: Diagnosis not present

## 2023-02-18 DIAGNOSIS — L820 Inflamed seborrheic keratosis: Secondary | ICD-10-CM | POA: Diagnosis not present

## 2023-02-20 DIAGNOSIS — G4733 Obstructive sleep apnea (adult) (pediatric): Secondary | ICD-10-CM | POA: Diagnosis not present

## 2023-02-26 ENCOUNTER — Other Ambulatory Visit: Payer: Self-pay | Admitting: Obstetrics & Gynecology

## 2023-02-26 DIAGNOSIS — R238 Other skin changes: Secondary | ICD-10-CM

## 2023-03-03 ENCOUNTER — Other Ambulatory Visit: Payer: Self-pay

## 2023-03-03 ENCOUNTER — Other Ambulatory Visit (HOSPITAL_COMMUNITY): Payer: Self-pay

## 2023-03-03 MED ORDER — NYSTATIN-TRIAMCINOLONE 100000-0.1 UNIT/GM-% EX CREA
1.0000 | TOPICAL_CREAM | Freq: Two times a day (BID) | CUTANEOUS | 0 refills | Status: DC
Start: 2023-03-03 — End: 2023-06-15
  Filled 2023-03-03: qty 30, 15d supply, fill #0

## 2023-03-03 NOTE — Telephone Encounter (Signed)
Medication refill request: nystatin cream  Last AEX:  12/24/22 Next AEX: nothing scheduled  Last MMG (if hormonal medication request): n/a Refill authorized: 30g 0 rf

## 2023-04-10 ENCOUNTER — Ambulatory Visit: Payer: BC Managed Care – PPO | Admitting: Primary Care

## 2023-04-10 ENCOUNTER — Encounter: Payer: Self-pay | Admitting: Primary Care

## 2023-04-10 VITALS — BP 122/78 | HR 79 | Temp 98.1°F | Ht 65.0 in | Wt 229.0 lb

## 2023-04-10 DIAGNOSIS — G4733 Obstructive sleep apnea (adult) (pediatric): Secondary | ICD-10-CM

## 2023-04-10 NOTE — Progress Notes (Signed)
@Patient  ID: [name], female    DOB: January 17, 1966, 58 y.o.   MRN: [id number]  Chief Complaint  Patient presents with   Follow-up    Referring provider: Allwardt, Crist Infante, PA-C  HPI: 57 year old female, former light smoker quit 1991.  Past medical history significant for severe obstructive sleep apnea.  Patient of Dr. Vassie Loll.  Home sleep study in February 2020 showed severe obstructive sleep apnea, AHI 69.4 an hour with SPO2 low 64%.  On auto CPAP 5-15 cm H2O.  Previous [facility] pulmonary encounter:  04/08/2022 Patient presents today for annual follow-up for OSA. Patient is doing well. She has no acute complaints. It took her a couple of months to get used to wearing her CPAP therapy but now she cannot sleep without using it. No issues with mask fit or pressure settings. She is working on weight loss.  DME is [facility].   Airview download 03/07/2022 - 04/05/2022 Usage 30/30 days (100%) greater than 4 hours Average usage 7 hours 44 minutes Pressure 5 to 15 cm H2O (14.6 cm H2O-95%) Air leaks 18.3 L/min (95%) AHI 1.6   04/10/2023- Interim hx Patient presents today for annual follow-up for OSA. Home sleep study in February 2020 showed severe obstructive sleep apnea, AHI 69.4 an hour with SPO2 low 64%. Maintained on auto CPAP. She is doing well, tolerating CPAP without issue. She is 100% compliant with use. She sleeps well at night, not waking up as frequently. Improvement in daytime fatigue. Epworth 5 (previously 7). She is currently getting over a viral URI, taking dayquil with improvement. No fevers or shortness of breath.   Airview (Myair) 03/11/23-6/6/2 Usage 30/30 days (100%)  Average 7 hours 42 mins Pressure 5-15cm h20 Airleaks 11.4L/min (95th %) Average AHI 0.5   No Known Allergies  Immunization History  Administered Date(s) Administered   Influenza Split 09/17/2011   Influenza Whole 09/18/2008, 09/17/2009   Influenza, Seasonal, Injecte, Preservative Fre 10/04/2012    Influenza,inj,Quad PF,6+ Mos 09/07/2014, 09/10/2015, 10/09/2016, 06/30/2017, 10/15/2018, 08/17/2019, 09/12/2020, 07/25/2021, 10/16/2022   PFIZER(Purple Top)SARS-COV-2 Vaccination 01/18/2020, 02/14/2020   Pneumococcal Polysaccharide-23 03/19/2009   Td 09/13/2007   Tdap 12/28/2017    Past Medical History:  Diagnosis Date   Anemia    Arthritis    Diverticulosis of colon (without mention of hemorrhage)    FIBROIDS, UTERUS    GERD    HYPERTENSION    Melanoma (HCC)    right leg   NSVD (normal spontaneous vaginal delivery)    X3   Obesity, unspecified    Osteopenia 11/2017   T score -1.1 FRAX 4.8% / 0.2%   Other abnormal glucose    Sleep apnea    THROMBOCYTHEMIA     Tobacco History: Social History   Tobacco Use  Smoking Status Former   Packs/day: 0.25   Years: 7.00   Additional pack years: 0.00   Total pack years: 1.75   Types: Cigarettes   Quit date: 1991   Years since quitting: 33.4  Smokeless Tobacco Never   Counseling given: Not Answered   Outpatient Medications Prior to Visit  Medication Sig Dispense Refill   amLODipine (NORVASC) 10 MG tablet Take 1 tablet (10 mg total) by mouth daily. 90 tablet 3   aspirin 81 MG tablet Take 81 mg by mouth at bedtime.     benazepril (LOTENSIN) 10 MG tablet Take 1 tablet (10 mg total) by mouth daily. 90 tablet 3   cholecalciferol (VITAMIN D3) 25 MCG (1000 UNIT) tablet Take 1,000 Units by mouth  at bedtime.     Collagen-Boron-Hyaluronic Acid (MOVE FREE ULTRA JOINT HEALTH PO) Take 1 tablet by mouth at bedtime.     loratadine (CLARITIN) 10 MG tablet Take 10 mg by mouth at bedtime.      Misc Natural Products (FOCUSED MIND PO) Take 2 capsules by mouth at bedtime. Focus Factor     nystatin-triamcinolone (MYCOLOG II) cream Apply to affected area(s) 2 (two) times daily. 30 g 0   pantoprazole (PROTONIX) 20 MG tablet TAKE 1 TABLET BY MOUTH ONCE A DAY 90 tablet 3   Turmeric 500 MG CAPS Take 1,000 mg by mouth at bedtime.     No  facility-administered medications prior to visit.    Review of Systems  Review of Systems  Constitutional: Negative.   Respiratory: Negative.    Cardiovascular: Negative.     Physical Exam  BP 122/78 (BP Location: Left Arm, Patient Position: Sitting, Cuff Size: Normal)   Pulse 79   Temp 98.1 F (36.7 C) (Temporal)   Ht 5\' 5"  (1.651 m)   Wt 229 lb (103.9 kg)   LMP 12/24/2015 (LMP Unknown) Comment: bso-not sexually  SpO2 97%   BMI 38.11 kg/m  Physical Exam Constitutional:      General: She is not in acute distress.    Appearance: Normal appearance. She is not ill-appearing.  HENT:     Head: Normocephalic and atraumatic.  Cardiovascular:     Rate and Rhythm: Normal rate and regular rhythm.  Pulmonary:     Effort: Pulmonary effort is normal.     Breath sounds: Normal breath sounds. No wheezing, rhonchi or rales.  Musculoskeletal:        General: Normal range of motion.  Skin:    General: Skin is warm and dry.  Neurological:     General: No focal deficit present.     Mental Status: She is alert and oriented to person, place, and time. Mental status is at baseline.  Psychiatric:        Mood and Affect: Mood normal.        Behavior: Behavior normal.        Thought Content: Thought content normal.        Judgment: Judgment normal.      Lab Results:  CBC    Component Value Date/Time   WBC 7.1 07/22/2022 0640   RBC 4.82 07/22/2022 0640   HGB 14.4 07/22/2022 0640   HGB 15.0 12/22/2017 1127   HGB 14.1 07/14/2011 1108   HCT 43.7 07/22/2022 0640   HCT 45.1 12/22/2017 1127   HCT 41.7 07/14/2011 1108   PLT 632 (H) 07/22/2022 0640   PLT 690 (H) 12/22/2017 1127   MCV 90.7 07/22/2022 0640   MCV 92 12/22/2017 1127   MCV 87.7 07/14/2011 1108   MCH 29.9 07/22/2022 0640   MCHC 33.0 07/22/2022 0640   RDW 14.2 07/22/2022 0640   RDW 14.3 12/22/2017 1127   RDW 14.3 07/14/2011 1108   LYMPHSABS 1.8 07/22/2022 0640   LYMPHSABS 1.8 12/22/2017 1127   LYMPHSABS 1.9 07/14/2011  1108   MONOABS 0.7 07/22/2022 0640   MONOABS 0.6 07/14/2011 1108   EOSABS 0.2 07/22/2022 0640   EOSABS 0.3 12/22/2017 1127   BASOSABS 0.1 07/22/2022 0640   BASOSABS 0.0 12/22/2017 1127   BASOSABS 0.0 07/14/2011 1108    BMET    Component Value Date/Time   NA 139 07/22/2022 0640   NA 141 12/22/2017 1127   K 3.7 07/22/2022 0640   CL 102 07/22/2022 0640  CO2 25 07/22/2022 0640   GLUCOSE 154 (H) 07/22/2022 0640   BUN 15 07/22/2022 0640   BUN 11 12/22/2017 1127   CREATININE 0.93 07/22/2022 0640   CREATININE 0.85 07/05/2020 1052   CALCIUM 9.6 07/22/2022 0640   GFRNONAA >60 07/22/2022 0640   GFRAA 88 12/22/2017 1127    BNP No results found for: "BNP"  ProBNP No results found for: "PROBNP"  Imaging: No results found.   Assessment & Plan:   OSA (obstructive sleep apnea) - Reviewed MyAir, patient is 100% compliant with CPAP last 30 days and reports benefit from use  - Current pressure 5-15cm h20; Residual AHI 0.5/hour  - No changes today. Advised she continue to wear CPAP nightly for 4 to 6 hours or longer. Work on [employer] loss efforts - FU in 1 year or sooner if needed    Glenford Bayley, NP 04/10/2023

## 2023-04-10 NOTE — Patient Instructions (Signed)
Reviewed MyAir, you are 100% compliant with CPAP  Sleep apnea is well-controlled on current pressure settings No changes today Continue her CPAP nightly for 4 to 6 hours or longer Continue DayQuil for additional 2 to 3 days after symptoms improved, consider trying Flonase or Nasacort  Orders: DME order sent to [facility] to enroll patient in Airview   Follow-up: 1 year with Dr. Vassie Loll or Waynetta Sandy NP   CPAP and BIPAP Information CPAP and BIPAP are methods that use air pressure to keep your airways open and to help you breathe well. CPAP and BIPAP use different amounts of pressure. Your health care provider will tell you whether CPAP or BIPAP would be more helpful for you. CPAP stands for "continuous positive airway pressure." With CPAP, the amount of pressure stays the same while you breathe in (inhale) and out (exhale). BIPAP stands for "bi-level positive airway pressure." With BIPAP, the amount of pressure will be higher when you inhale and lower when you exhale. This allows you to take larger breaths. CPAP or BIPAP may be used in the hospital, or your health care provider may want you to use it at home. You may need to have a sleep study before your health care provider can order a machine for you to use at home. What are the advantages? CPAP or BIPAP can be helpful if you have: Sleep apnea. Chronic obstructive pulmonary disease (COPD). Heart failure. Medical conditions that cause muscle weakness, including muscular dystrophy or amyotrophic lateral sclerosis (ALS). Other problems that cause breathing to be shallow, weak, abnormal, or difficult. CPAP and BIPAP are most commonly used for obstructive sleep apnea (OSA) to keep the airways from collapsing when the muscles relax during sleep. What are the risks? Generally, this is a safe treatment. However, problems may occur, including: Irritated skin or skin sores if the mask does not fit properly. Dry or stuffy nose or nosebleeds. Dry  mouth. Feeling gassy or bloated. Sinus or lung infection if the equipment is not cleaned properly. When should CPAP or BIPAP be used? In most cases, the mask only needs to be worn during sleep. Generally, the mask needs to be worn throughout the night and during any daytime naps. People with certain medical conditions may also need to wear the mask at other times, such as when they are awake. Follow instructions from your health care provider about when to use the machine. What happens during CPAP or BIPAP?  Both CPAP and BIPAP are provided by a small machine with a flexible plastic tube that attaches to a plastic mask that you wear. Air is blown through the mask into your nose or mouth. The amount of pressure that is used to blow the air can be adjusted on the machine. Your health care provider will set the pressure setting and help you find the best mask for you. Tips for using the mask Because the mask needs to be snug, some people feel trapped or closed-in (claustrophobic) when first using the mask. If you feel this way, you may need to get used to the mask. One way to do this is to hold the mask loosely over your nose or mouth and then gradually apply the mask more snugly. You can also gradually increase the amount of time that you use the mask. Masks are available in various types and sizes. If your mask does not fit well, talk with your health care provider about getting a different one. Some common types of masks include: Full face masks,  which fit over the mouth and nose. Nasal masks, which fit over the nose. Nasal pillow or prong masks, which fit into the nostrils. If you are using a mask that fits over your nose and you tend to breathe through your mouth, a chin strap may be applied to help keep your mouth closed. Use a skin barrier to protect your skin as told by your health care provider. Some CPAP and BIPAP machines have alarms that may sound if the mask comes off or develops a  leak. If you have trouble with the mask, it is very important that you talk with your health care provider about finding a way to make the mask easier to tolerate. Do not stop using the mask. There could be a negative impact on your health if you stop using the mask. Tips for using the machine Place your CPAP or BIPAP machine on a secure table or stand near an electrical outlet. Know where the on/off switch is on the machine. Follow instructions from your health care provider about how to set the pressure on your machine and when you should use it. Do not eat or drink while the CPAP or BIPAP machine is on. Food or fluids could get pushed into your lungs by the pressure of the CPAP or BIPAP. For home use, CPAP and BIPAP machines can be rented or purchased through home health care companies. Many different brands of machines are available. Renting a machine before purchasing may help you find out which particular machine works well for you. Your health insurance company may also decide which machine you may get. Keep the CPAP or BIPAP machine and attachments clean. Ask your health care provider for specific instructions. Check the humidifier if you have a dry stuffy nose or nosebleeds. Make sure it is working correctly. Follow these instructions at home: Take over-the-counter and prescription medicines only as told by your health care provider. Ask if you can take sinus medicine if your sinuses are blocked. Do not use any products that contain nicotine or tobacco. These products include cigarettes, chewing tobacco, and vaping devices, such as e-cigarettes. If you need help quitting, ask your health care provider. Keep all follow-up visits. This is important. Contact a health care provider if: You have redness or pressure sores on your head, face, mouth, or nose from the mask or head gear. You have trouble using the CPAP or BIPAP machine. You cannot tolerate wearing the CPAP or BIPAP mask. Someone  tells you that you snore even when wearing your CPAP or BIPAP. Get help right away if: You have trouble breathing. You feel confused. Summary CPAP and BIPAP are methods that use air pressure to keep your airways open and to help you breathe well. If you have trouble with the mask, it is very important that you talk with your health care provider about finding a way to make the mask easier to tolerate. Do not stop using the mask. There could be a negative impact to your health if you stop using the mask. Follow instructions from your health care provider about when to use the machine. This information is not intended to replace advice given to you by your health care provider. Make sure you discuss any questions you have with your health care provider. Document Revised: 05/29/2021 Document Reviewed: 09/28/2020 Elsevier Patient Education  [place].

## 2023-04-10 NOTE — Assessment & Plan Note (Addendum)
-   Reviewed MyAir, patient is 100% compliant with CPAP last 30 days and reports benefit from use  - Current pressure 5-15cm h20; Residual AHI 0.5/hour  - No changes today. Advised she continue to wear CPAP nightly for 4 to 6 hours or longer. Work on [employer] loss efforts - FU in 1 year or sooner if needed

## 2023-04-30 ENCOUNTER — Other Ambulatory Visit (HOSPITAL_COMMUNITY): Payer: Self-pay

## 2023-05-20 DIAGNOSIS — Z85820 Personal history of malignant melanoma of skin: Secondary | ICD-10-CM | POA: Diagnosis not present

## 2023-05-20 DIAGNOSIS — D2262 Melanocytic nevi of left upper limb, including shoulder: Secondary | ICD-10-CM | POA: Diagnosis not present

## 2023-05-20 DIAGNOSIS — L821 Other seborrheic keratosis: Secondary | ICD-10-CM | POA: Diagnosis not present

## 2023-05-20 DIAGNOSIS — L565 Disseminated superficial actinic porokeratosis (DSAP): Secondary | ICD-10-CM | POA: Diagnosis not present

## 2023-06-02 DIAGNOSIS — G4733 Obstructive sleep apnea (adult) (pediatric): Secondary | ICD-10-CM | POA: Diagnosis not present

## 2023-06-15 ENCOUNTER — Other Ambulatory Visit: Payer: Self-pay | Admitting: Obstetrics & Gynecology

## 2023-06-15 DIAGNOSIS — R238 Other skin changes: Secondary | ICD-10-CM

## 2023-06-16 ENCOUNTER — Other Ambulatory Visit: Payer: Self-pay

## 2023-06-16 MED ORDER — NYSTATIN-TRIAMCINOLONE 100000-0.1 UNIT/GM-% EX CREA
1.0000 | TOPICAL_CREAM | Freq: Two times a day (BID) | CUTANEOUS | 0 refills | Status: AC
Start: 2023-06-16 — End: ?
  Filled 2023-06-16: qty 30, 15d supply, fill #0

## 2023-06-16 NOTE — Telephone Encounter (Signed)
Medication refill request: nystatin-triamcinolone Last AEX:  12-24-22 Next AEX: not scheduled Last MMG (if hormonal medication request): n/a Refill authorized: Last filled 03-03-23 30 grams with 0 refills. Please approve or deny as appropriate.

## 2023-06-17 ENCOUNTER — Other Ambulatory Visit: Payer: Self-pay

## 2023-06-17 ENCOUNTER — Other Ambulatory Visit (HOSPITAL_COMMUNITY): Payer: Self-pay

## 2023-08-04 ENCOUNTER — Other Ambulatory Visit: Payer: Self-pay

## 2023-08-04 ENCOUNTER — Other Ambulatory Visit: Payer: Self-pay | Admitting: Radiology

## 2023-08-04 ENCOUNTER — Other Ambulatory Visit: Payer: Self-pay | Admitting: Physician Assistant

## 2023-08-04 DIAGNOSIS — R238 Other skin changes: Secondary | ICD-10-CM

## 2023-08-04 MED ORDER — NYSTATIN-TRIAMCINOLONE 100000-0.1 UNIT/GM-% EX CREA
1.0000 | TOPICAL_CREAM | Freq: Two times a day (BID) | CUTANEOUS | 0 refills | Status: DC
Start: 2023-08-04 — End: 2024-01-27
  Filled 2023-08-04: qty 30, 15d supply, fill #0

## 2023-08-04 NOTE — Telephone Encounter (Signed)
Medication refill request: nystatin cream  Last AEX:  12/24/22 Next AEX: n/a Last MMG (if hormonal medication request): n/a Refill authorized: 30g pended for today

## 2023-08-05 ENCOUNTER — Other Ambulatory Visit: Payer: Self-pay | Admitting: Physician Assistant

## 2023-08-05 ENCOUNTER — Other Ambulatory Visit (HOSPITAL_COMMUNITY): Payer: Self-pay

## 2023-08-05 ENCOUNTER — Other Ambulatory Visit: Payer: Self-pay

## 2023-08-05 MED ORDER — PANTOPRAZOLE SODIUM 20 MG PO TBEC
20.0000 mg | DELAYED_RELEASE_TABLET | Freq: Every day | ORAL | 0 refills | Status: DC
  Filled 2023-08-05: qty 30, 30d supply, fill #0

## 2023-08-05 MED ORDER — AMLODIPINE BESYLATE 10 MG PO TABS
10.0000 mg | ORAL_TABLET | Freq: Every day | ORAL | 0 refills | Status: DC
  Filled 2023-08-05: qty 30, 30d supply, fill #0

## 2023-08-05 MED ORDER — BENAZEPRIL HCL 10 MG PO TABS
10.0000 mg | ORAL_TABLET | Freq: Every day | ORAL | 0 refills | Status: DC
  Filled 2023-08-05: qty 30, 30d supply, fill #0

## 2023-08-20 ENCOUNTER — Other Ambulatory Visit: Payer: Self-pay

## 2023-08-20 ENCOUNTER — Encounter: Payer: Self-pay | Admitting: Physician Assistant

## 2023-08-20 ENCOUNTER — Ambulatory Visit: Payer: BC Managed Care – PPO | Admitting: Physician Assistant

## 2023-08-20 ENCOUNTER — Other Ambulatory Visit (HOSPITAL_COMMUNITY): Payer: Self-pay

## 2023-08-20 VITALS — BP 142/70 | HR 80 | Temp 97.8°F | Ht 65.0 in | Wt 231.4 lb

## 2023-08-20 DIAGNOSIS — Z23 Encounter for immunization: Secondary | ICD-10-CM | POA: Diagnosis not present

## 2023-08-20 DIAGNOSIS — R600 Localized edema: Secondary | ICD-10-CM

## 2023-08-20 DIAGNOSIS — Z0000 Encounter for general adult medical examination without abnormal findings: Secondary | ICD-10-CM | POA: Diagnosis not present

## 2023-08-20 DIAGNOSIS — E559 Vitamin D deficiency, unspecified: Secondary | ICD-10-CM

## 2023-08-20 DIAGNOSIS — Z1211 Encounter for screening for malignant neoplasm of colon: Secondary | ICD-10-CM

## 2023-08-20 LAB — CBC WITH DIFFERENTIAL/PLATELET
Basophils Absolute: 0 10*3/uL (ref 0.0–0.1)
Basophils Relative: 0.8 % (ref 0.0–3.0)
Eosinophils Absolute: 0.2 10*3/uL (ref 0.0–0.7)
Eosinophils Relative: 2.7 % (ref 0.0–5.0)
HCT: 44.6 % (ref 36.0–46.0)
Hemoglobin: 14.5 g/dL (ref 12.0–15.0)
Lymphocytes Relative: 24.9 % (ref 12.0–46.0)
Lymphs Abs: 1.5 10*3/uL (ref 0.7–4.0)
MCHC: 32.5 g/dL (ref 30.0–36.0)
MCV: 90.3 fL (ref 78.0–100.0)
Monocytes Absolute: 0.5 10*3/uL (ref 0.1–1.0)
Monocytes Relative: 8.6 % (ref 3.0–12.0)
Neutro Abs: 3.9 10*3/uL (ref 1.4–7.7)
Neutrophils Relative %: 63 % (ref 43.0–77.0)
Platelets: 612 10*3/uL — ABNORMAL HIGH (ref 150.0–400.0)
RBC: 4.94 Mil/uL (ref 3.87–5.11)
RDW: 14.3 % (ref 11.5–15.5)
WBC: 6.1 10*3/uL (ref 4.0–10.5)

## 2023-08-20 LAB — LIPID PANEL
Cholesterol: 160 mg/dL (ref 0–200)
HDL: 49.1 mg/dL (ref >39.00)
LDL Cholesterol: 75 mg/dL (ref 0–99)
NonHDL: 110.47
Total CHOL/HDL Ratio: 3
Triglycerides: 178 mg/dL — ABNORMAL HIGH (ref 0.0–149.0)
VLDL: 35.6 mg/dL (ref 0.0–40.0)

## 2023-08-20 LAB — COMPREHENSIVE METABOLIC PANEL
ALT: 52 U/L — ABNORMAL HIGH (ref 0–35)
AST: 31 U/L (ref 0–37)
Albumin: 4.7 g/dL (ref 3.5–5.2)
Alkaline Phosphatase: 68 U/L (ref 39–117)
BUN: 16 mg/dL (ref 6–23)
CO2: 27 meq/L (ref 19–32)
Calcium: 10 mg/dL (ref 8.4–10.5)
Chloride: 101 meq/L (ref 96–112)
Creatinine, Ser: 0.78 mg/dL (ref 0.40–1.20)
GFR: 83.07 mL/min (ref >60.00)
Glucose, Bld: 111 mg/dL — ABNORMAL HIGH (ref 70–99)
Potassium: 3.9 meq/L (ref 3.5–5.1)
Sodium: 139 meq/L (ref 135–145)
Total Bilirubin: 0.5 mg/dL (ref 0.2–1.2)
Total Protein: 8.2 g/dL (ref 6.0–8.3)

## 2023-08-20 LAB — TSH: TSH: 2.33 u[IU]/mL (ref 0.35–5.50)

## 2023-08-20 LAB — HEMOGLOBIN A1C: Hgb A1c MFr Bld: 6.8 % — ABNORMAL HIGH (ref 4.6–6.5)

## 2023-08-20 LAB — MICROALBUMIN / CREATININE URINE RATIO
Creatinine,U: 183.1 mg/dL
Microalb Creat Ratio: 2.1 mg/g (ref 0.0–30.0)
Microalb, Ur: 3.8 mg/dL — ABNORMAL HIGH (ref 0.0–1.9)

## 2023-08-20 LAB — VITAMIN D 25 HYDROXY (VIT D DEFICIENCY, FRACTURES): VITD: 24.69 ng/mL — ABNORMAL LOW (ref 30.00–100.00)

## 2023-08-20 MED ORDER — FUROSEMIDE 20 MG PO TABS
20.0000 mg | ORAL_TABLET | Freq: Every day | ORAL | 0 refills | Status: DC | PRN
Start: 2023-08-20 — End: 2024-01-20
  Filled 2023-08-20: qty 30, 30d supply, fill #0

## 2023-08-20 NOTE — Progress Notes (Signed)
Subjective:    Patient ID: [name], female    DOB: 1966/04/11, 57 y.o.   MRN: [id number]  Chief Complaint  Patient presents with   Annual Exam    Fasting.    HPI Patient is in today for annual exam.  Acute concerns: None   Health maintenance: Lifestyle/ exercise: not walking as much, but cares for 59 yo and 1 yo grand kids Nutrition: working on changes Mental health: doing well  Substance use: none Sexual activity: widowed  Immunizations: flu shot today  Colonoscopy: done just prior to age 25 - diverticulosis per pt Pap: GYN - UTD  Mammogram: UTD  DEXA: updated this year - osteopenia present  Skin: Follows with dermatology - melanoma on R lower leg last year, caught early; goes every 3 months   Past Medical History:  Diagnosis Date   Anemia    Arthritis    Diverticulosis of colon (without mention of hemorrhage)    FIBROIDS, UTERUS    GERD    HYPERTENSION    Melanoma (HCC)    right leg   NSVD (normal spontaneous vaginal delivery)    X3   Obesity, unspecified    Osteopenia 11/2017   T score -1.1 FRAX 4.8% / 0.2%   Other abnormal glucose    Sleep apnea    THROMBOCYTHEMIA     Past Surgical History:  Procedure Laterality Date   COLONOSCOPY     DIAGNOSTIC LAPAROSCOPY     EXCISION MELANOMA WITH SENTINEL LYMPH NODE BIOPSY N/A 07/22/2022   Procedure: WIDE LOCAL EXCISION RIGHT SHIN MELANOMA WITH ADVANCEMENT FLAP CLOSURE WITH SENTINEL NODE BIOPSY;  Surgeon: Almond Lint, MD;  Location: [facility];  Service: General;  Laterality: N/A;   HYSTEROSCOPY  06/19/2005   ASPIRATION OF LEFT PARATUBAL CYST   KNEE ARTHROSCOPY Right 2006   Dr Sherlean Foot   LAPAROSCOPIC BILATERAL SALPINGO OOPHERECTOMY Bilateral 03/04/2016   Procedure: LAPAROSCOPIC BILATERAL SALPINGO OOPHORECTOMY with pelvic washings;  Surgeon: Ok Edwards, MD;  Location: [facility];  Service: Gynecology;  Laterality: Bilateral;   LYSIS OF ADHESION N/A 03/04/2016   Procedure: LYSIS OF ADHESION;  Surgeon: Ok Edwards, MD;  Location: [facility];  Service: Gynecology;  Laterality: N/A;   RESECTOSCOPIC POLYPECTOMY     TOTAL KNEE ARTHROPLASTY Right 10/10/2015   Procedure: TOTAL RIGHT KNEE ARTHROPLASTY;  Surgeon: Gean Birchwood, MD;  Location: [facility];  Service: Orthopedics;  Laterality: Right;   TUBAL LIGATION  2006   FILSHIE CLIP TECHNIQUE   UTERINE FIBROID SURGERY      Family History  Problem Relation Age of Onset   Breast cancer Mother 59       breast cancer   Cancer Mother 48       breast cancer   Stroke Mother    AVM Mother    Diabetes Father    Hypertension Father    Heart disease Father 67       CABG/CAD   Hyperlipidemia Father    Diabetes Maternal Grandmother     Social History   Tobacco Use   Smoking status: Former    Current packs/day: 0.00    Average packs/day: 0.3 packs/day for 7.0 years (1.8 ttl pk-yrs)    Types: Cigarettes    Start date: 49    Quit date: 1991    Years since quitting: 33.8   Smokeless tobacco: Never  Vaping Use   Vaping status: Never Used  Substance Use Topics   Alcohol use: Not Currently    Comment: very  occassionally   Drug use: No     Allergies  Allergen Reactions   Metformin And Related Diarrhea    Review of Systems NEGATIVE UNLESS OTHERWISE INDICATED IN HPI      Objective:     BP (!) 142/70   Pulse 80   Temp 97.8 F (36.6 C)   Ht 5\' 5"  (1.651 m)   Wt 231 lb 6.4 oz (105 kg)   LMP 12/24/2015 (LMP Unknown) Comment: bso-not sexually  SpO2 97%   BMI 38.51 kg/m   Wt Readings from Last 3 Encounters:  08/20/23 231 lb 6.4 oz (105 kg)  04/10/23 229 lb (103.9 kg)  12/24/22 233 lb (105.7 kg)    BP Readings from Last 3 Encounters:  08/20/23 (!) 142/70  04/10/23 122/78  12/24/22 128/80     Physical Exam Vitals and nursing note reviewed.  Constitutional:      Appearance: Normal appearance. She is obese. She is not toxic-appearing.  HENT:     Head: Normocephalic and atraumatic.     Right Ear: Tympanic membrane, ear canal and  external ear normal.     Left Ear: Tympanic membrane, ear canal and external ear normal.     Nose: Nose normal.     Mouth/Throat:     Mouth: Mucous membranes are moist.  Eyes:     Extraocular Movements: Extraocular movements intact.     Conjunctiva/sclera: Conjunctivae normal.     Pupils: Pupils are equal, round, and reactive to light.  Cardiovascular:     Rate and Rhythm: Normal rate and regular rhythm.     Pulses: Normal pulses.     Heart sounds: Normal heart sounds.  Pulmonary:     Effort: Pulmonary effort is normal.     Breath sounds: Normal breath sounds.  Abdominal:     General: Abdomen is flat. Bowel sounds are normal.     Palpations: Abdomen is soft.  Musculoskeletal:        General: Normal range of motion.     Cervical back: Normal range of motion and neck supple.     Right lower leg: Edema present.     Left lower leg: Edema present.  Skin:    General: Skin is warm and dry.  Neurological:     General: No focal deficit present.     Mental Status: She is alert and oriented to person, place, and time.  Psychiatric:        Mood and Affect: Mood normal.        Behavior: Behavior normal.        Thought Content: Thought content normal.        Judgment: Judgment normal.        Assessment & Plan:  Encounter for annual physical exam -     CBC with Differential/Platelet -     Comprehensive metabolic panel -     Lipid panel -     TSH -     Hemoglobin A1c -     Microalbumin / creatinine urine ratio  Need for influenza vaccination -     Flu vaccine trivalent PF, 6mos and older(Flulaval,Afluria,Fluarix,Fluzone)  Vitamin D deficiency -     VITAMIN D 25 Hydroxy (Vit-D Deficiency, Fractures)  Screening for colon cancer -     Ambulatory referral to Gastroenterology  Localized edema -     Furosemide; Take 1 tablet (20 mg total) by mouth daily as needed for lower leg edema.  Dispense: 30 tablet; Refill: 0    Age-appropriate screening  and counseling performed today.  Will check labs and call with results. Preventive measures discussed and printed in AVS for patient.   Patient Counseling: [x]   Nutrition: Stressed importance of moderation in sodium/caffeine intake, saturated fat and cholesterol, caloric balance, sufficient intake of fresh fruits, vegetables, and fiber.  [x]   Stressed the importance of regular exercise.   []   Substance Abuse: Discussed cessation/primary prevention of tobacco, alcohol, or other drug use; driving or other dangerous activities under the influence; availability of treatment for abuse.   [x]   Injury prevention: Discussed safety belts, safety helmets, smoke detector, smoking near bedding or upholstery.   []   Sexuality: Discussed sexually transmitted diseases, partner selection, use of condoms, avoidance of unintended pregnancy  and contraceptive alternatives.   [x]   Dental health: Discussed importance of regular tooth brushing, flossing, and dental visits.  [x]   Health maintenance and immunizations reviewed. Please refer to Health maintenance section.        Return in about 1 year (around 08/19/2024) for physical, fasting labs .    Luceil Herrin M Tashaun Obey, PA-C

## 2023-08-20 NOTE — Patient Instructions (Signed)
Please call [facility] to schedule your colonoscopy- [phone].

## 2023-08-31 ENCOUNTER — Other Ambulatory Visit (HOSPITAL_COMMUNITY): Payer: Self-pay

## 2023-08-31 ENCOUNTER — Other Ambulatory Visit: Payer: Self-pay | Admitting: Physician Assistant

## 2023-08-31 ENCOUNTER — Other Ambulatory Visit: Payer: Self-pay

## 2023-08-31 MED ORDER — PANTOPRAZOLE SODIUM 20 MG PO TBEC
20.0000 mg | DELAYED_RELEASE_TABLET | Freq: Every day | ORAL | 0 refills | Status: DC
  Filled 2023-08-31: qty 30, 30d supply, fill #0

## 2023-08-31 MED ORDER — AMLODIPINE BESYLATE 10 MG PO TABS
10.0000 mg | ORAL_TABLET | Freq: Every day | ORAL | 0 refills | Status: DC
  Filled 2023-08-31: qty 30, 30d supply, fill #0

## 2023-08-31 MED ORDER — BENAZEPRIL HCL 10 MG PO TABS
10.0000 mg | ORAL_TABLET | Freq: Every day | ORAL | 0 refills | Status: DC
  Filled 2023-08-31: qty 30, 30d supply, fill #0

## 2023-09-01 DIAGNOSIS — G4733 Obstructive sleep apnea (adult) (pediatric): Secondary | ICD-10-CM | POA: Diagnosis not present

## 2023-09-03 DIAGNOSIS — L820 Inflamed seborrheic keratosis: Secondary | ICD-10-CM | POA: Diagnosis not present

## 2023-09-03 DIAGNOSIS — L570 Actinic keratosis: Secondary | ICD-10-CM | POA: Diagnosis not present

## 2023-09-03 DIAGNOSIS — D692 Other nonthrombocytopenic purpura: Secondary | ICD-10-CM | POA: Diagnosis not present

## 2023-09-03 DIAGNOSIS — L814 Other melanin hyperpigmentation: Secondary | ICD-10-CM | POA: Diagnosis not present

## 2023-09-03 DIAGNOSIS — D2261 Melanocytic nevi of right upper limb, including shoulder: Secondary | ICD-10-CM | POA: Diagnosis not present

## 2023-09-03 DIAGNOSIS — D225 Melanocytic nevi of trunk: Secondary | ICD-10-CM | POA: Diagnosis not present

## 2023-09-03 DIAGNOSIS — Z85820 Personal history of malignant melanoma of skin: Secondary | ICD-10-CM | POA: Diagnosis not present

## 2023-09-03 DIAGNOSIS — D485 Neoplasm of uncertain behavior of skin: Secondary | ICD-10-CM | POA: Diagnosis not present

## 2023-09-28 ENCOUNTER — Other Ambulatory Visit: Payer: Self-pay | Admitting: Physician Assistant

## 2023-09-29 ENCOUNTER — Other Ambulatory Visit: Payer: Self-pay

## 2023-09-29 ENCOUNTER — Other Ambulatory Visit (HOSPITAL_COMMUNITY): Payer: Self-pay

## 2023-09-29 MED ORDER — BENAZEPRIL HCL 10 MG PO TABS
10.0000 mg | ORAL_TABLET | Freq: Every day | ORAL | 0 refills | Status: DC
  Filled 2023-09-29: qty 30, 30d supply, fill #0

## 2023-09-29 MED ORDER — AMLODIPINE BESYLATE 10 MG PO TABS
10.0000 mg | ORAL_TABLET | Freq: Every day | ORAL | 0 refills | Status: DC
  Filled 2023-09-29: qty 30, 30d supply, fill #0

## 2023-09-29 MED ORDER — PANTOPRAZOLE SODIUM 20 MG PO TBEC
20.0000 mg | DELAYED_RELEASE_TABLET | Freq: Every day | ORAL | 0 refills | Status: DC
  Filled 2023-09-29: qty 30, 30d supply, fill #0

## 2023-10-21 ENCOUNTER — Encounter: Payer: Self-pay | Admitting: Physician Assistant

## 2023-10-21 NOTE — Telephone Encounter (Signed)
 Care team updated and letter sent for eye exam notes.

## 2023-10-31 ENCOUNTER — Other Ambulatory Visit: Payer: Self-pay | Admitting: Physician Assistant

## 2023-11-02 ENCOUNTER — Other Ambulatory Visit: Payer: Self-pay

## 2023-11-02 MED ORDER — AMLODIPINE BESYLATE 10 MG PO TABS
10.0000 mg | ORAL_TABLET | Freq: Every day | ORAL | 0 refills | Status: DC
  Filled 2023-11-02: qty 30, 30d supply, fill #0

## 2023-11-02 MED ORDER — PANTOPRAZOLE SODIUM 20 MG PO TBEC
20.0000 mg | DELAYED_RELEASE_TABLET | Freq: Every day | ORAL | 0 refills | Status: DC
  Filled 2023-11-02: qty 30, 30d supply, fill #0

## 2023-11-02 MED ORDER — BENAZEPRIL HCL 10 MG PO TABS
10.0000 mg | ORAL_TABLET | Freq: Every day | ORAL | 0 refills | Status: DC
  Filled 2023-11-02: qty 30, 30d supply, fill #0

## 2023-11-20 ENCOUNTER — Encounter: Payer: Self-pay | Admitting: Gastroenterology

## 2023-12-02 ENCOUNTER — Other Ambulatory Visit: Payer: Self-pay

## 2023-12-02 ENCOUNTER — Other Ambulatory Visit: Payer: Self-pay | Admitting: Physician Assistant

## 2023-12-02 ENCOUNTER — Other Ambulatory Visit (HOSPITAL_COMMUNITY): Payer: Self-pay

## 2023-12-02 MED ORDER — AMLODIPINE BESYLATE 10 MG PO TABS
10.0000 mg | ORAL_TABLET | Freq: Every day | ORAL | 0 refills | Status: DC
  Filled 2023-12-02: qty 30, 30d supply, fill #0

## 2023-12-02 MED ORDER — PANTOPRAZOLE SODIUM 20 MG PO TBEC
20.0000 mg | DELAYED_RELEASE_TABLET | Freq: Every day | ORAL | 0 refills | Status: DC
  Filled 2023-12-02: qty 30, 30d supply, fill #0

## 2023-12-02 MED ORDER — BENAZEPRIL HCL 10 MG PO TABS
10.0000 mg | ORAL_TABLET | Freq: Every day | ORAL | 0 refills | Status: DC
  Filled 2023-12-02: qty 30, 30d supply, fill #0

## 2023-12-04 DIAGNOSIS — G4733 Obstructive sleep apnea (adult) (pediatric): Secondary | ICD-10-CM | POA: Diagnosis not present

## 2023-12-09 ENCOUNTER — Encounter: Payer: Self-pay | Admitting: Physician Assistant

## 2023-12-09 ENCOUNTER — Ambulatory Visit: Payer: BC Managed Care – PPO | Admitting: Physician Assistant

## 2023-12-09 ENCOUNTER — Other Ambulatory Visit: Payer: Self-pay

## 2023-12-09 ENCOUNTER — Other Ambulatory Visit (HOSPITAL_COMMUNITY): Payer: Self-pay

## 2023-12-09 VITALS — BP 148/76 | HR 72 | Temp 98.4°F | Ht 65.0 in | Wt 226.0 lb

## 2023-12-09 DIAGNOSIS — M170 Bilateral primary osteoarthritis of knee: Secondary | ICD-10-CM

## 2023-12-09 DIAGNOSIS — E782 Mixed hyperlipidemia: Secondary | ICD-10-CM

## 2023-12-09 DIAGNOSIS — E1165 Type 2 diabetes mellitus with hyperglycemia: Secondary | ICD-10-CM | POA: Diagnosis not present

## 2023-12-09 DIAGNOSIS — I10 Essential (primary) hypertension: Secondary | ICD-10-CM | POA: Diagnosis not present

## 2023-12-09 DIAGNOSIS — K219 Gastro-esophageal reflux disease without esophagitis: Secondary | ICD-10-CM

## 2023-12-09 LAB — LIPID PANEL
Cholesterol: 193 mg/dL (ref 0–200)
HDL: 53 mg/dL (ref >39.00)
LDL Cholesterol: 98 mg/dL (ref 0–99)
NonHDL: 139.68
Total CHOL/HDL Ratio: 4
Triglycerides: 207 mg/dL — ABNORMAL HIGH (ref 0.0–149.0)
VLDL: 41.4 mg/dL — ABNORMAL HIGH (ref 0.0–40.0)

## 2023-12-09 LAB — HEMOGLOBIN A1C: Hgb A1c MFr Bld: 6.6 % — ABNORMAL HIGH (ref 4.6–6.5)

## 2023-12-09 MED ORDER — BENAZEPRIL HCL 10 MG PO TABS
10.0000 mg | ORAL_TABLET | Freq: Every day | ORAL | 3 refills | Status: AC
  Filled 2023-12-09 – 2023-12-28 (×2): qty 90, 90d supply, fill #0
  Filled 2024-03-27: qty 90, 90d supply, fill #1
  Filled 2024-06-27: qty 90, 90d supply, fill #2
  Filled 2024-09-29: qty 90, 90d supply, fill #3

## 2023-12-09 MED ORDER — MELOXICAM 15 MG PO TABS
15.0000 mg | ORAL_TABLET | Freq: Every day | ORAL | 2 refills | Status: DC
  Filled 2023-12-09: qty 30, 30d supply, fill #0
  Filled 2024-01-12: qty 30, 30d supply, fill #1
  Filled 2024-02-26: qty 30, 30d supply, fill #2

## 2023-12-09 MED ORDER — AMLODIPINE BESYLATE 10 MG PO TABS
10.0000 mg | ORAL_TABLET | Freq: Every day | ORAL | 3 refills | Status: AC
  Filled 2023-12-09 – 2023-12-28 (×2): qty 90, 90d supply, fill #0
  Filled 2024-03-27: qty 90, 90d supply, fill #1
  Filled 2024-06-27: qty 90, 90d supply, fill #2
  Filled 2024-09-29: qty 90, 90d supply, fill #3

## 2023-12-09 MED ORDER — PANTOPRAZOLE SODIUM 20 MG PO TBEC
20.0000 mg | DELAYED_RELEASE_TABLET | Freq: Every day | ORAL | 3 refills | Status: AC
  Filled 2023-12-09 – 2023-12-28 (×2): qty 90, 90d supply, fill #0
  Filled 2024-03-27: qty 90, 90d supply, fill #1
  Filled 2024-07-07: qty 90, 90d supply, fill #2
  Filled 2024-10-14: qty 90, 90d supply, fill #3

## 2023-12-09 NOTE — Progress Notes (Signed)
 Patient ID: [name], female    DOB: 1966/05/20, 59 y.o.   MRN: [id number]   Assessment & Plan:  Type 2 diabetes mellitus with hyperglycemia, without long-term current use of insulin (HCC) -     Hemoglobin A1c  Elevated cholesterol with high triglycerides -     Lipid panel  Primary osteoarthritis of both knees -     Meloxicam ; Take 1 tablet (15 mg total) by mouth daily. Take with food.  Dispense: 30 tablet; Refill: 2  Essential hypertension -     amLODIPine  Besylate; Take 1 tablet (10 mg total) by mouth daily.  Dispense: 90 tablet; Refill: 3 -     Benazepril  HCl; Take 1 tablet (10 mg total) by mouth daily.  Dispense: 90 tablet; Refill: 3  Gastroesophageal reflux disease, unspecified whether esophagitis present -     Pantoprazole  Sodium; Take 1 tablet (20 mg total) by mouth daily.  Dispense: 90 tablet; Refill: 3    Assessment and Plan    Weight Management Successful weight loss of 5 pounds over the holiday season. Patient has started walking for exercise but reports knee pain. Patient is considering weight loss medications but prefers to continue with lifestyle modifications at this time. -Encourage continued lifestyle modifications including low carb diet and regular exercise. -Prescribe Meloxicam  for knee pain associated with increased exercise. Take for 10-14 days consistently then as needed. Pt aware of risks vs benefits and possible adverse reactions.  Hypertension Stable on current medications (Benazepril , Amlodipine , Pantoprazole ). Blood pressure slightly elevated at today's visit, possibly due to white coat syndrome. -Continue current medications. Change prescriptions to 90-day supply with annual refills.  T2DM: Diet / exercise controlled. Lab Results  Component Value Date   HGBA1C 6.8 (H) 08/20/2023   HGBA1C 6.4 07/26/2021   HGBA1C 5.9 (H) 07/05/2020     Follow-up in 6 months (July/August 2025) to reassess weight management and blood pressure control.  Consider weight loss medications if A1c increases or weight loss plateaus.          Return in about 6 months (around 06/07/2024) for recheck/follow-up.    Subjective:    Chief Complaint  Patient presents with   Diabetes    Pt in office for diabetes follow-up; pt has no concerns other than weight loss;     Diabetes     History of Present Illness   The patient, with a history of hypertension and obesity, presents for a routine follow-up. She reports a weight loss of five pounds over the holiday season, which she attributes to attempts at a low-carb diet and increased physical activity, specifically walking. However, she expresses frustration with knee pain, which she believes is due to bone-on-bone contact, and requests a prescription for meloxicam  to manage the pain. The patient also mentions an upcoming trip to [place] and expresses concern about her ability to manage the physical demands of the trip due to her knee pain. She also expresses interest in weight loss medications, but is hesitant to start any new medications before her trip. The patient's blood pressure was slightly elevated during the visit.       Past Medical History:  Diagnosis Date   Anemia    Arthritis    Diverticulosis of colon (without mention of hemorrhage)    FIBROIDS, UTERUS    GERD    HYPERTENSION    Melanoma (HCC)    right leg   NSVD (normal spontaneous vaginal delivery)    X3   Obesity, unspecified  Osteopenia 11/2017   T score -1.1 FRAX 4.8% / 0.2%   Other abnormal glucose    Sleep apnea    THROMBOCYTHEMIA     Past Surgical History:  Procedure Laterality Date   COLONOSCOPY     DIAGNOSTIC LAPAROSCOPY     EXCISION MELANOMA WITH SENTINEL LYMPH NODE BIOPSY N/A 07/22/2022   Procedure: WIDE LOCAL EXCISION RIGHT SHIN MELANOMA WITH ADVANCEMENT FLAP CLOSURE WITH SENTINEL NODE BIOPSY;  Surgeon: Aron Shoulders, MD;  Location: [facility];  Service: General;  Laterality: N/A;   HYSTEROSCOPY  06/19/2005    ASPIRATION OF LEFT PARATUBAL CYST   KNEE ARTHROSCOPY Right 2006   Dr Rubie   LAPAROSCOPIC BILATERAL SALPINGO OOPHERECTOMY Bilateral 03/04/2016   Procedure: LAPAROSCOPIC BILATERAL SALPINGO OOPHORECTOMY with pelvic washings;  Surgeon: Curlee VEAR Guan, MD;  Location: [facility];  Service: Gynecology;  Laterality: Bilateral;   LYSIS OF ADHESION N/A 03/04/2016   Procedure: LYSIS OF ADHESION;  Surgeon: Curlee VEAR Guan, MD;  Location: [facility];  Service: Gynecology;  Laterality: N/A;   RESECTOSCOPIC POLYPECTOMY     TOTAL KNEE ARTHROPLASTY Right 10/10/2015   Procedure: TOTAL RIGHT KNEE ARTHROPLASTY;  Surgeon: Dempsey Sensor, MD;  Location: [facility];  Service: Orthopedics;  Laterality: Right;   TUBAL LIGATION  2006   FILSHIE CLIP TECHNIQUE   UTERINE FIBROID SURGERY      Family History  Problem Relation Age of Onset   Breast cancer Mother 25       breast cancer   Cancer Mother 65       breast cancer   Stroke Mother    AVM Mother    Diabetes Father    Hypertension Father    Heart disease Father 1       CABG/CAD   Hyperlipidemia Father    Diabetes Maternal Grandmother     Social History   Tobacco Use   Smoking status: Former    Current packs/day: 0.00    Average packs/day: 0.3 packs/day for 7.0 years (1.8 ttl pk-yrs)    Types: Cigarettes    Start date: 16    Quit date: 27    Years since quitting: 34.1   Smokeless tobacco: Never  Vaping Use   Vaping status: Never Used  Substance Use Topics   Alcohol use: Not Currently    Comment: very occassionally   Drug use: No     Allergies  Allergen Reactions   Metformin  And Related Diarrhea    Review of Systems NEGATIVE UNLESS OTHERWISE INDICATED IN HPI      Objective:     BP (!) 148/76 (BP Location: Left Arm, Patient Position: Sitting, Cuff Size: Large)   Pulse 72   Temp 98.4 F (36.9 C) (Temporal)   Ht 5' 5 (1.651 m)   Wt 226 lb (102.5 kg)   LMP 12/24/2015 (LMP Unknown) Comment: bso-not sexually  SpO2 97%   BMI 37.61 kg/m    Wt Readings from Last 3 Encounters:  12/09/23 226 lb (102.5 kg)  08/20/23 231 lb 6.4 oz (105 kg)  04/10/23 229 lb (103.9 kg)    BP Readings from Last 3 Encounters:  12/09/23 (!) 148/76  08/20/23 (!) 142/70  04/10/23 122/78     Physical Exam Vitals and nursing note reviewed.  Constitutional:      Appearance: Normal appearance. She is obese. She is not toxic-appearing.  HENT:     Head: Normocephalic and atraumatic.     Right Ear: Tympanic membrane, ear canal and external ear normal.  Left Ear: Tympanic membrane, ear canal and external ear normal.     Nose: Nose normal.     Mouth/Throat:     Mouth: Mucous membranes are moist.  Eyes:     Extraocular Movements: Extraocular movements intact.     Conjunctiva/sclera: Conjunctivae normal.     Pupils: Pupils are equal, round, and reactive to light.  Cardiovascular:     Rate and Rhythm: Normal rate and regular rhythm.     Pulses: Normal pulses.     Heart sounds: Normal heart sounds.  Pulmonary:     Effort: Pulmonary effort is normal.     Breath sounds: Normal breath sounds.  Abdominal:     General: Abdomen is flat. Bowel sounds are normal.     Palpations: Abdomen is soft.  Musculoskeletal:        General: Normal range of motion.     Cervical back: Normal range of motion and neck supple.     Right lower leg: Edema present.     Left lower leg: Edema present.  Skin:    General: Skin is warm and dry.  Neurological:     General: No focal deficit present.     Mental Status: She is alert and oriented to person, place, and time.  Psychiatric:        Mood and Affect: Mood normal.        Behavior: Behavior normal.        Thought Content: Thought content normal.        Judgment: Judgment normal.       Terrell Ostrand M Jerric Oyen, PA-C

## 2023-12-10 ENCOUNTER — Encounter: Payer: Self-pay | Admitting: Physician Assistant

## 2023-12-21 DIAGNOSIS — Z1231 Encounter for screening mammogram for malignant neoplasm of breast: Secondary | ICD-10-CM | POA: Diagnosis not present

## 2023-12-23 ENCOUNTER — Encounter: Payer: Self-pay | Admitting: Obstetrics and Gynecology

## 2023-12-28 ENCOUNTER — Other Ambulatory Visit (HOSPITAL_COMMUNITY): Payer: Self-pay

## 2023-12-28 ENCOUNTER — Other Ambulatory Visit: Payer: Self-pay

## 2023-12-30 ENCOUNTER — Ambulatory Visit (INDEPENDENT_AMBULATORY_CARE_PROVIDER_SITE_OTHER): Payer: BC Managed Care – PPO | Admitting: Obstetrics and Gynecology

## 2023-12-30 ENCOUNTER — Other Ambulatory Visit: Payer: Self-pay

## 2023-12-30 ENCOUNTER — Encounter: Payer: Self-pay | Admitting: Obstetrics and Gynecology

## 2023-12-30 ENCOUNTER — Other Ambulatory Visit (HOSPITAL_COMMUNITY): Payer: Self-pay

## 2023-12-30 VITALS — BP 134/80 | HR 80 | Ht 65.0 in | Wt 229.0 lb

## 2023-12-30 DIAGNOSIS — Z01419 Encounter for gynecological examination (general) (routine) without abnormal findings: Secondary | ICD-10-CM | POA: Diagnosis not present

## 2023-12-30 DIAGNOSIS — Z1331 Encounter for screening for depression: Secondary | ICD-10-CM

## 2023-12-30 DIAGNOSIS — D219 Benign neoplasm of connective and other soft tissue, unspecified: Secondary | ICD-10-CM

## 2023-12-30 DIAGNOSIS — Z780 Asymptomatic menopausal state: Secondary | ICD-10-CM | POA: Diagnosis not present

## 2023-12-30 MED ORDER — ALENDRONATE SODIUM 70 MG PO TABS
70.0000 mg | ORAL_TABLET | ORAL | 11 refills | Status: DC
  Filled 2023-12-30: qty 4, 28d supply, fill #0
  Filled 2024-01-27: qty 4, 28d supply, fill #1
  Filled 2024-02-26: qty 4, 28d supply, fill #2
  Filled 2024-03-27: qty 4, 28d supply, fill #3
  Filled 2024-04-22: qty 4, 28d supply, fill #4
  Filled 2024-05-22: qty 4, 28d supply, fill #5
  Filled 2024-06-17: qty 4, 28d supply, fill #6
  Filled 2024-07-16: qty 4, 28d supply, fill #7
  Filled 2024-08-12: qty 4, 28d supply, fill #8
  Filled 2024-09-16: qty 4, 28d supply, fill #9
  Filled 2024-10-14: qty 4, 28d supply, fill #10
  Filled 2024-11-08: qty 4, 28d supply, fill #11

## 2023-12-30 NOTE — Progress Notes (Signed)
 58 y.o. y.o. female here for annual exam. Patient's last menstrual period was 12/24/2015 (lmp unknown).   G3P3L3 Widowed x 7 1/2 yrs.  Has 2 grand-daughters.   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  No postmenopausal bleeding.  No pelvic pain.  Abstinent.  Pap Neg 12/2020.  Will repeat Pap at 3 years. Urine and bowel movements normal. Breasts normal.  Mammo Neg 12/2022.  Mother Dxed with Breast Ca at age 54.  Body mass index 39.07. Planning to walk and do the recumbent bike.  Low carb diet.  Health labs with family physician. Colono 2014, will schedule this year. BD very mild Osteopenia in 11/2017 -1.1  2024: T-2.1 AP spine to begin fosamax.  She has reflux but will let us know if worse with the medication and to start prolia.   Body mass index is 38.11 kg/m.     12/30/2023   11:47 AM 12/09/2023   11:50 AM 12/09/2023   11:49 AM  Depression screen PHQ 2/9  Decreased Interest 0 0 0  Down, Depressed, Hopeless 0 0 0  PHQ - 2 Score 0 0 0  Altered sleeping  0   Tired, decreased energy  0   Change in appetite  0   Feeling bad or failure about yourself   0   Trouble concentrating  0   Moving slowly or fidgety/restless  0   Suicidal thoughts  0   PHQ-9 Score  0   Difficult doing work/chores  Not difficult at all     Blood pressure 134/80, pulse 80, height 5\' 5"  (1.651 m), weight 229 lb (103.9 kg), last menstrual period 12/24/2015, SpO2 98%.  No results found for: "DIAGPAP", "HPVHIGH", "ADEQPAP"  GYN HISTORY: No results found for: "DIAGPAP", "HPVHIGH", "ADEQPAP"  OB History  Gravida Para Term Preterm AB Living  3 3 3   3   SAB IAB Ectopic Multiple Live Births      3    # Outcome Date GA Lbr Len/2nd Weight Sex Type Anes PTL Lv  3 Term     M Vag-Spont  N LIV  2 Term     F Vag-Spont  N LIV  1 Term     F Vag-Spont  N LIV    Past Medical History:  Diagnosis Date   Anemia     Arthritis    Diverticulosis of colon (without mention of hemorrhage)    FIBROIDS, UTERUS    GERD    HYPERTENSION    Melanoma (HCC)    right leg   NSVD (normal spontaneous vaginal delivery)    X3   Obesity, unspecified    Osteopenia 11/2017   T score -1.1 FRAX 4.8% / 0.2%   Other abnormal glucose    Sleep apnea    THROMBOCYTHEMIA     Past Surgical History:  Procedure Laterality Date   COLONOSCOPY     DIAGNOSTIC LAPAROSCOPY     EXCISION MELANOMA WITH SENTINEL LYMPH NODE BIOPSY N/A 07/22/2022   Procedure: WIDE LOCAL EXCISION RIGHT SHIN MELANOMA WITH ADVANCEMENT FLAP CLOSURE WITH SENTINEL NODE BIOPSY;  Surgeon: Almond Lint, MD;  Location: [facility];  Service: General;  Laterality: N/A;   HYSTEROSCOPY  06/19/2005   ASPIRATION OF LEFT PARATUBAL CYST   KNEE ARTHROSCOPY Right 2006   Dr Sherlean Foot   LAPAROSCOPIC BILATERAL SALPINGO OOPHERECTOMY Bilateral 03/04/2016   Procedure: LAPAROSCOPIC BILATERAL SALPINGO OOPHORECTOMY with pelvic washings;  Surgeon: Ok Edwards, MD;  Location: [facility];  Service: Gynecology;  Laterality: Bilateral;   LYSIS OF ADHESION N/A 03/04/2016   Procedure: LYSIS OF ADHESION;  Surgeon: Ok Edwards, MD;  Location: [facility];  Service: Gynecology;  Laterality: N/A;   RESECTOSCOPIC POLYPECTOMY     TOTAL KNEE ARTHROPLASTY Right 10/10/2015   Procedure: TOTAL RIGHT KNEE ARTHROPLASTY;  Surgeon: Gean Birchwood, MD;  Location: [facility];  Service: Orthopedics;  Laterality: Right;   TUBAL LIGATION  Jan 17, 2005   FILSHIE CLIP TECHNIQUE   UTERINE FIBROID SURGERY      Current Outpatient Medications on File Prior to Visit  Medication Sig Dispense Refill   amLODipine (NORVASC) 10 MG tablet Take 1 tablet (10 mg total) by mouth daily. 90 tablet 3   aspirin 81 MG tablet Take 81 mg by mouth at bedtime.     benazepril (LOTENSIN) 10 MG tablet Take 1 tablet (10 mg total) by mouth daily. 90 tablet 3   cholecalciferol (VITAMIN D3) 25 MCG (1000 UNIT) tablet Take 1,000 Units by mouth at bedtime.      Collagen-Boron-Hyaluronic Acid (MOVE FREE ULTRA JOINT HEALTH PO) Take 1 tablet by mouth at bedtime.     loratadine (CLARITIN) 10 MG tablet Take 10 mg by mouth at bedtime.      meloxicam (MOBIC) 15 MG tablet Take 1 tablet (15 mg total) by mouth daily. Take with food. 30 tablet 2   Misc Natural Products (FOCUSED MIND PO) Take 2 capsules by mouth at bedtime. Focus Factor     nystatin-triamcinolone (MYCOLOG II) cream Apply to affected area(s) 2 (two) times daily. 30 g 0   pantoprazole (PROTONIX) 20 MG tablet Take 1 tablet (20 mg total) by mouth daily. 90 tablet 3   Turmeric 500 MG CAPS Take 1,000 mg by mouth at bedtime.     furosemide (LASIX) 20 MG tablet Take 1 tablet (20 mg total) by mouth daily as needed for lower leg edema. (Patient not taking: Reported on 12/30/2023) 30 tablet 0   No current facility-administered medications on file prior to visit.    Social History   Socioeconomic History   Marital status: Widowed    Spouse name: Not on file   Number of children: 3   Years of education: Not on file   Highest education level: Not on file  Occupational History   Occupation: homemaker  Tobacco Use   Smoking status: Former    Current packs/day: 0.00    Average packs/day: 0.3 packs/day for 7.0 years (1.8 ttl pk-yrs)    Types: Cigarettes    Start date: 72    Quit date: 10    Years since quitting: 34.1   Smokeless tobacco: Never  Vaping Use   Vaping status: Never Used  Substance and Sexual Activity   Alcohol use: Yes    Comment: very occassionally   Drug use: No   Sexual activity: Not Currently    Birth control/protection: Post-menopausal, Surgical    Comment: TUBAL LIGATION, bso  Other Topics Concern   Not on file  Social History Narrative   Marital status: widowed since 01/18/2015; Husband was a Podiatrist in 2015-01-18; died of esophageal cancer.      Children: 2 daughters, 1 son; no grandchildren      Lives: with 2 daughters, daughter's fiance.      Employment: homemaker      Tobacco; none      Alcohol: none      Exercise:  Walking or biking or yoga.  Seatbelt: 100%    Social Drivers of Corporate investment banker Strain: Not on file  Food Insecurity: Not on file  Transportation Needs: Not on file  Physical Activity: Insufficiently Active (10/14/2017)   Exercise Vital Sign    Days of Exercise per Week: 3 days    Minutes of Exercise per Session: 30 min  Stress: Not on file  Social Connections: Not on file  Intimate Partner Violence: Not on file    Family History  Problem Relation Age of Onset   Breast cancer Mother 31       breast cancer   Cancer Mother 69       breast cancer   Stroke Mother    AVM Mother    Diabetes Father    Hypertension Father    Heart disease Father 26       CABG/CAD   Hyperlipidemia Father    Diabetes Maternal Grandmother      Allergies  Allergen Reactions   Metformin And Related Diarrhea      Patient's last menstrual period was Patient's last menstrual period was 12/24/2015 (lmp unknown)..            Review of Systems Alls systems reviewed and are negative.     Physical Exam Constitutional:      Appearance: Normal appearance.  Genitourinary:     Vulva and urethral meatus normal.     No lesions in the vagina.     Right Labia: No rash, lesions or skin changes.    Left Labia: No lesions, skin changes or rash.    No vaginal discharge or tenderness.     No vaginal prolapse present.    No vaginal atrophy present.     Right Adnexa: absent.    Left Adnexa: absent.    No cervical motion tenderness or discharge.     Uterus is enlarged and irregular.     Uterus is not tender.  Breasts:    Right: Normal.     Left: Normal.  HENT:     Head: Normocephalic.  Neck:     Thyroid: No thyroid mass, thyromegaly or thyroid tenderness.  Cardiovascular:     Rate and Rhythm: Normal rate and regular rhythm.     Heart sounds: Normal heart sounds, S1 normal and S2 normal.  Pulmonary:     Effort: Pulmonary effort  is normal.     Breath sounds: Normal breath sounds and air entry.  Abdominal:     General: There is no distension.     Palpations: Abdomen is soft. There is no mass.     Tenderness: There is no abdominal tenderness. There is no guarding or rebound.  Musculoskeletal:        General: Normal range of motion.     Cervical back: Full passive range of motion without pain, normal range of motion and neck supple. No tenderness.     Right lower leg: No edema.     Left lower leg: No edema.  Neurological:     Mental Status: She is alert.  Skin:    General: Skin is warm.  Psychiatric:        Mood and Affect: Mood normal.        Behavior: Behavior normal.        Thought Content: Thought content normal.  Vitals and nursing note reviewed. Exam conducted with a chaperone present.       A:         Well Woman GYN exam  P:        Pap smear not indicated Encouraged annual mammogram screening Colon cancer screening up-to-date DXA up-to-date.  To begin fosamax.  Counseled on r/b/a/I side effects and alternative medications Labs and immunizations to do with PMD Discussed breast self exams Encouraged healthy lifestyle practices Encouraged Vit D and Calcium   No follow-ups on file.  Earley Favor

## 2023-12-31 ENCOUNTER — Telehealth: Payer: Self-pay

## 2023-12-31 DIAGNOSIS — Z780 Asymptomatic menopausal state: Secondary | ICD-10-CM

## 2023-12-31 DIAGNOSIS — D219 Benign neoplasm of connective and other soft tissue, unspecified: Secondary | ICD-10-CM

## 2023-12-31 NOTE — Telephone Encounter (Signed)
 Pt notified and voiced understanding.   Order amended and msg sent to appt desk to schedule.   Pt also mentioned that she would like to have pap done since one wasn't done at her [facility] and she feels that she is due since it had been 3 years since her last.

## 2023-12-31 NOTE — Telephone Encounter (Signed)
 Pt [name] stating that she just received a VM from our office stating that Dr. Karma Greaser wanted her/ordered to schedule a PUS. However, pt reports that she doesn't recall a topic of conversation being that she needed a PUS. She also mentioned that in the VM that she received a wrong/different phone # than our office.   A staff msg was sent to EB.

## 2024-01-06 ENCOUNTER — Ambulatory Visit (AMBULATORY_SURGERY_CENTER): Payer: BC Managed Care – PPO

## 2024-01-06 VITALS — Ht 65.0 in | Wt 227.0 lb

## 2024-01-06 DIAGNOSIS — Z1211 Encounter for screening for malignant neoplasm of colon: Secondary | ICD-10-CM

## 2024-01-06 MED ORDER — SUFLAVE 178.7 G PO SOLR: 1.0000 | Freq: Once | ORAL | 0 refills | Status: AC

## 2024-01-06 NOTE — Progress Notes (Signed)

## 2024-01-07 NOTE — Telephone Encounter (Signed)
 PUS scheduled for 01/20/2024.

## 2024-01-12 ENCOUNTER — Other Ambulatory Visit (HOSPITAL_COMMUNITY): Payer: Self-pay

## 2024-01-20 ENCOUNTER — Other Ambulatory Visit (HOSPITAL_COMMUNITY)
Admission: RE | Admit: 2024-01-20 | Discharge: 2024-01-20 | Disposition: A | Discharge status: Home / Self Care | Source: Ambulatory Visit | Attending: Obstetrics and Gynecology | Admitting: Obstetrics and Gynecology

## 2024-01-20 ENCOUNTER — Ambulatory Visit (INDEPENDENT_AMBULATORY_CARE_PROVIDER_SITE_OTHER): Admitting: Obstetrics and Gynecology

## 2024-01-20 ENCOUNTER — Ambulatory Visit (INDEPENDENT_AMBULATORY_CARE_PROVIDER_SITE_OTHER)

## 2024-01-20 ENCOUNTER — Encounter: Payer: Self-pay | Admitting: Obstetrics and Gynecology

## 2024-01-20 VITALS — BP 120/78 | HR 77

## 2024-01-20 DIAGNOSIS — D219 Benign neoplasm of connective and other soft tissue, unspecified: Secondary | ICD-10-CM

## 2024-01-20 DIAGNOSIS — N816 Rectocele: Secondary | ICD-10-CM

## 2024-01-20 DIAGNOSIS — Z780 Asymptomatic menopausal state: Secondary | ICD-10-CM | POA: Diagnosis not present

## 2024-01-20 DIAGNOSIS — Z124 Encounter for screening for malignant neoplasm of cervix: Secondary | ICD-10-CM

## 2024-01-20 DIAGNOSIS — Z712 Person consulting for explanation of examination or test findings: Secondary | ICD-10-CM

## 2024-01-20 DIAGNOSIS — Z0000 Encounter for general adult medical examination without abnormal findings: Secondary | ICD-10-CM | POA: Insufficient documentation

## 2024-01-20 NOTE — Progress Notes (Addendum)
 58 y.o. y.o. female here for PUS and to have pap smear collected.  Not performed at her recent annual and she would like it done.  Patient's last menstrual period was 12/24/2015 (lmp unknown).    G3P3L3 Widowed x 7 1/2 yrs.  Has 2 grand-daughters.  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  No postmenopausal bleeding.  No pelvic pain.  Abstinent.  Pap Neg 12/2020.  Will repeat Pap at 3 years. Urine and bowel movements normal. Breasts normal.  Mammo Neg 12/2022.  Mother Dxed with Breast Ca at age 45.  Body mass index 39.07. Planning to walk and do the recumbent bike.  Low carb diet.  Health labs with family physician. Colono 2014, will schedule this year. BD very mild Osteopenia in 11/2017 -1.1  2024: T-2.1 AP spine to begin fosamax.  She has reflux but will let us know if worse with the medication and to start prolia.  01/20/24 PUS with normal endometrial lining and 1.96cm fibroid. Both ovaries removed.   There is no height or weight on file to calculate BMI.     12/30/2023   11:47 AM 12/09/2023   11:50 AM 12/09/2023   11:49 AM  Depression screen PHQ 2/9  Decreased Interest 0 0 0  Down, Depressed, Hopeless 0 0 0  PHQ - 2 Score 0 0 0  Altered sleeping  0   Tired, decreased energy  0   Change in appetite  0   Feeling bad or failure about yourself   0   Trouble concentrating  0   Moving slowly or fidgety/restless  0   Suicidal thoughts  0   PHQ-9 Score  0   Difficult doing work/chores  Not difficult at all     Blood pressure 120/78, pulse 77, last menstrual period 12/24/2015, SpO2 99%.  No results found for: "DIAGPAP", "HPVHIGH", "ADEQPAP"  GYN HISTORY: No results found for: "DIAGPAP", "HPVHIGH", "ADEQPAP"  OB History  Gravida Para Term Preterm AB Living  3 3 3   3   SAB IAB Ectopic Multiple Live Births      3    # Outcome Date GA Lbr Len/2nd Weight Sex Type Anes PTL Lv  3 Term     M Vag-Spont  N LIV  2 Term     F Vag-Spont  N LIV  1  Term     F Vag-Spont  N LIV    Past Medical History:  Diagnosis Date   Anemia    Arthritis    Diverticulosis of colon (without mention of hemorrhage)    FIBROIDS, UTERUS    GERD    HYPERTENSION    Melanoma (HCC)    right leg   NSVD (normal spontaneous vaginal delivery)    X3   Obesity, unspecified    Osteopenia 11/2017   T score -1.1 FRAX 4.8% / 0.2%   Other abnormal glucose    Sleep apnea    THROMBOCYTHEMIA     Past Surgical History:  Procedure Laterality Date   COLONOSCOPY     DIAGNOSTIC LAPAROSCOPY     EXCISION MELANOMA WITH SENTINEL LYMPH NODE BIOPSY N/A 07/22/2022   Procedure: WIDE LOCAL EXCISION RIGHT SHIN MELANOMA WITH ADVANCEMENT FLAP CLOSURE WITH SENTINEL NODE BIOPSY;  Surgeon: Almond Lint, MD;  Location: [facility];  Service: General;  Laterality: N/A;   HYSTEROSCOPY  06/19/2005   ASPIRATION OF LEFT PARATUBAL CYST   KNEE ARTHROSCOPY Right 2006   Dr Sherlean Foot   LAPAROSCOPIC  BILATERAL SALPINGO OOPHERECTOMY Bilateral 03/04/2016   Procedure: LAPAROSCOPIC BILATERAL SALPINGO OOPHORECTOMY with pelvic washings;  Surgeon: Ok Edwards, MD;  Location: [facility];  Service: Gynecology;  Laterality: Bilateral;   LYSIS OF ADHESION N/A 03/04/2016   Procedure: LYSIS OF ADHESION;  Surgeon: Ok Edwards, MD;  Location: [facility];  Service: Gynecology;  Laterality: N/A;   RESECTOSCOPIC POLYPECTOMY     TOTAL KNEE ARTHROPLASTY Right 10/10/2015   Procedure: TOTAL RIGHT KNEE ARTHROPLASTY;  Surgeon: Gean Birchwood, MD;  Location: [facility];  Service: Orthopedics;  Laterality: Right;   TUBAL LIGATION  02/16/2005   FILSHIE CLIP TECHNIQUE   UTERINE FIBROID SURGERY      Current Outpatient Medications on File Prior to Visit  Medication Sig Dispense Refill   alendronate (FOSAMAX) 70 MG tablet Take 1 tablet (70 mg total) by mouth every 7 (seven) days. Take with a full glass of water on an empty stomach. 4 tablet 11   amLODipine (NORVASC) 10 MG tablet Take 1 tablet (10 mg total) by mouth daily. 90 tablet 3    aspirin 81 MG tablet Take 81 mg by mouth at bedtime.     benazepril (LOTENSIN) 10 MG tablet Take 1 tablet (10 mg total) by mouth daily. 90 tablet 3   cholecalciferol (VITAMIN D3) 25 MCG (1000 UNIT) tablet Take 1,000 Units by mouth at bedtime.     Collagen-Boron-Hyaluronic Acid (MOVE FREE ULTRA JOINT HEALTH PO) Take 1 tablet by mouth at bedtime.     loratadine (CLARITIN) 10 MG tablet Take 10 mg by mouth at bedtime.      meloxicam (MOBIC) 15 MG tablet Take 1 tablet (15 mg total) by mouth daily. Take with food. 30 tablet 2   Misc Natural Products (FOCUSED MIND PO) Take 2 capsules by mouth at bedtime. Focus Factor     nystatin-triamcinolone (MYCOLOG II) cream Apply to affected area(s) 2 (two) times daily. 30 g 0   pantoprazole (PROTONIX) 20 MG tablet Take 1 tablet (20 mg total) by mouth daily. 90 tablet 3   Turmeric 500 MG CAPS Take 1,000 mg by mouth at bedtime.     No current facility-administered medications on file prior to visit.    Social History   Socioeconomic History   Marital status: Widowed    Spouse name: Not on file   Number of children: 3   Years of education: Not on file   Highest education level: Not on file  Occupational History   Occupation: homemaker  Tobacco Use   Smoking status: Former    Current packs/day: 0.00    Average packs/day: 0.3 packs/day for 7.0 years (1.8 ttl pk-yrs)    Types: Cigarettes    Start date: 34    Quit date: 60    Years since quitting: 34.2   Smokeless tobacco: Never  Vaping Use   Vaping status: Never Used  Substance and Sexual Activity   Alcohol use: Yes    Comment: very occassionally   Drug use: No   Sexual activity: Not Currently    Birth control/protection: Post-menopausal, Surgical    Comment: TUBAL LIGATION, bso  Other Topics Concern   Not on file  Social History Narrative   Marital status: widowed since 02/17/2015; Husband was a Podiatrist in 02/17/15; died of esophageal cancer.      Children: 2 daughters, 1 son; no grandchildren       Lives: with 2 daughters, daughter's fiance.      Employment: homemaker     Tobacco; none  Alcohol: none      Exercise:  Walking or biking or yoga.       Seatbelt: 100%    Social Drivers of Corporate investment banker Strain: Not on file  Food Insecurity: Not on file  Transportation Needs: Not on file  Physical Activity: Insufficiently Active (10/14/2017)   Exercise Vital Sign    Days of Exercise per Week: 3 days    Minutes of Exercise per Session: 30 min  Stress: Not on file  Social Connections: Not on file  Intimate Partner Violence: Not on file    Family History  Problem Relation Age of Onset   Breast cancer Mother 77       breast cancer   Cancer Mother 52       breast cancer   Stroke Mother    AVM Mother    Diabetes Father    Hypertension Father    Heart disease Father 76       CABG/CAD   Hyperlipidemia Father    Diabetes Maternal Grandmother    Colon cancer Neg Hx    Rectal cancer Neg Hx    Stomach cancer Neg Hx    Esophageal cancer Neg Hx      Allergies  Allergen Reactions   Metformin And Related Diarrhea      Patient's last menstrual period was Patient's last menstrual period was 12/24/2015 (lmp unknown)..             Review of Systems Alls systems reviewed and are negative.   PUS 5.91cm uterus Endometrial lining 3.68mm Small intramural fibroid noted  Both ovaries are surgically absent No adnexal masses No free fluid  Sve: rectocele seen. Normal discharge Pap smear collected.  No lesions    A:         PUS results and pap smear collection                             P:        Pap smear collected. No charge for this PUS reassuring today with small fibroid seen.  Counseled on benign nature. Endometrial lining in normal range.  Continue with fosamax.  Has tolerated the first couple of doses.   5 minutes spent on reviewing records, imaging,  and one on one patient time and counseling patient and documentation Dr. Karma Greaser  No  follow-ups on file.  Earley Favor

## 2024-01-21 ENCOUNTER — Encounter: Payer: BC Managed Care – PPO | Admitting: Gastroenterology

## 2024-01-22 LAB — CYTOLOGY - PAP: Diagnosis: NEGATIVE

## 2024-01-25 ENCOUNTER — Encounter: Payer: Self-pay | Admitting: Obstetrics and Gynecology

## 2024-01-25 ENCOUNTER — Encounter: Payer: Self-pay | Admitting: Gastroenterology

## 2024-01-27 ENCOUNTER — Other Ambulatory Visit: Payer: Self-pay

## 2024-01-27 ENCOUNTER — Other Ambulatory Visit (HOSPITAL_COMMUNITY): Payer: Self-pay

## 2024-01-27 ENCOUNTER — Other Ambulatory Visit: Payer: Self-pay | Admitting: Radiology

## 2024-01-27 DIAGNOSIS — R238 Other skin changes: Secondary | ICD-10-CM

## 2024-01-27 MED ORDER — NYSTATIN-TRIAMCINOLONE 100000-0.1 UNIT/GM-% EX CREA
1.0000 | TOPICAL_CREAM | Freq: Two times a day (BID) | CUTANEOUS | 0 refills | Status: DC
  Filled 2024-01-27: qty 30, 15d supply, fill #0

## 2024-01-27 NOTE — Telephone Encounter (Signed)
 Med refill request: Mycolog 2 Cream Last AEX: 12/30/2023-EB Next AEX: 01/02/2025-EB Last MMG (if hormonal med): n/a Refill authorized: rx pend.   Per EMR investigation: pt uses under belly for skin irritant/yeast.

## 2024-01-28 ENCOUNTER — Ambulatory Visit (AMBULATORY_SURGERY_CENTER): Payer: BC Managed Care – PPO | Admitting: Gastroenterology

## 2024-01-28 ENCOUNTER — Encounter: Payer: Self-pay | Admitting: Gastroenterology

## 2024-01-28 VITALS — BP 154/85 | HR 69 | Temp 98.1°F | Resp 16 | Ht 65.0 in | Wt 227.0 lb

## 2024-01-28 DIAGNOSIS — K5730 Diverticulosis of large intestine without perforation or abscess without bleeding: Secondary | ICD-10-CM

## 2024-01-28 DIAGNOSIS — K648 Other hemorrhoids: Secondary | ICD-10-CM | POA: Diagnosis not present

## 2024-01-28 DIAGNOSIS — Z1211 Encounter for screening for malignant neoplasm of colon: Secondary | ICD-10-CM | POA: Diagnosis not present

## 2024-01-28 DIAGNOSIS — Q439 Congenital malformation of intestine, unspecified: Secondary | ICD-10-CM

## 2024-01-28 MED ORDER — SODIUM CHLORIDE 0.9 % IV SOLN: 500.0000 mL | Freq: Once | INTRAVENOUS | Status: DC

## 2024-01-28 NOTE — Progress Notes (Signed)
 Pt's states no medical or surgical changes since previsit or office visit.

## 2024-01-28 NOTE — Op Note (Signed)
 [facility] Patient Name: [name] Procedure Date: 01/28/2024 10:47 AM MRN: [id number] Endoscopist: Sherilyn Cooter L. Myrtie Neither , MD, [id number] Age: 58 Referring MD:  Date of Birth: [birth year] Gender: Female Account #: [id number] Procedure:                Colonoscopy Indications:              Screening for colorectal malignant neoplasm Medicines:                Monitored Anesthesia Care Procedure:                Pre-Anesthesia Assessment:                           - Prior to the procedure, a History and Physical                            was performed, and patient medications and                            allergies were reviewed. The patient's tolerance of                            previous anesthesia was also reviewed. The risks                            and benefits of the procedure and the sedation                            options and risks were discussed with the patient.                            All questions were answered, and informed consent                            was obtained. Prior Anticoagulants: The patient has                            taken no anticoagulant or antiplatelet agents. ASA                            Grade Assessment: III - A patient with severe                            systemic disease. After reviewing the risks and                            benefits, the patient was deemed in satisfactory                            condition to undergo the procedure.                           After obtaining informed consent, the colonoscope  was passed under direct vision. Throughout the                            procedure, the patient's blood pressure, pulse, and                            oxygen saturations were monitored continuously. The                            Olympus Scope [id number] was introduced through the                            anus and advanced to the the cecum, identified by                             appendiceal orifice and ileocecal valve. The                            colonoscopy was somewhat difficult due to multiple                            diverticula in the colon, a redundant colon and a                            tortuous colon. Successful completion of the                            procedure was aided by using manual pressure,                            straightening and shortening the scope to obtain                            bowel loop reduction and lavage. The patient                            tolerated the procedure well. The quality of the                            bowel preparation was good with additional lavage                            of the right colon.. The ileocecal valve,                            appendiceal orifice, and rectum were photographed. Scope In: 11:03:21 AM Scope Out: 11:19:19 AM Scope Withdrawal Time: 0 hours 10 minutes 48 seconds  Total Procedure Duration: 0 hours 15 minutes 58 seconds  Findings:                 The perianal and digital rectal examinations were                            normal.  Repeat examination of right colon under NBI                            performed.                           Multiple diverticula were found in the left colon.                            There was associated tortuosity and angulation.                            There was also mild patchy mucosal erythema (see                            photo).                           Internal hemorrhoids were found.                           The exam was otherwise without abnormality on                            direct and retroflexion views. Complications:            No immediate complications. Estimated Blood Loss:     Estimated blood loss: none. Impression:               - Diverticulosis in the left colon. Mild patchy                            mucosal erythema in the left colon area of                            diverticulosis.  Benign finding and patient without                            chronic abdominal pain or diarrhea.                           - Internal hemorrhoids.                           - The examination was otherwise normal on direct                            and retroflexion views.                           - No specimens collected. Recommendation:           - Patient has a contact number available for                            emergencies. The signs and symptoms of potential  delayed complications were discussed with the                            patient. Return to normal activities tomorrow.                            Written discharge instructions were provided to the                            patient.                           - Resume previous diet.                           - Continue present medications.                           - Repeat colonoscopy in 10 years for screening.                            (Consume more water with prep for next exam) Sherilyn Cooter L. Myrtie Neither, MD 01/28/2024 11:25:44 AM This report has been signed electronically.

## 2024-01-28 NOTE — Progress Notes (Signed)
 History and Physical:  This patient presents for endoscopic testing for: Encounter Diagnosis  Name Primary?   Special screening for malignant neoplasms, colon Yes    Average risk for colorectal cancer.  2nd screening exam. No polyps last colonoscopy Oct 2014 Patient denies chronic abdominal pain, rectal bleeding, constipation or diarrhea.    Patient is otherwise without complaints or active issues today.   Past Medical History: Past Medical History:  Diagnosis Date   Anemia    Arthritis    Diverticulosis of colon (without mention of hemorrhage)    FIBROIDS, UTERUS    GERD    HYPERTENSION    Melanoma (HCC)    right leg   NSVD (normal spontaneous vaginal delivery)    X3   Obesity, unspecified    Osteopenia 11/2017   T score -1.1 FRAX 4.8% / 0.2%   Other abnormal glucose    Sleep apnea    THROMBOCYTHEMIA      Past Surgical History: Past Surgical History:  Procedure Laterality Date   COLONOSCOPY     DIAGNOSTIC LAPAROSCOPY     EXCISION MELANOMA WITH SENTINEL LYMPH NODE BIOPSY N/A 07/22/2022   Procedure: WIDE LOCAL EXCISION RIGHT SHIN MELANOMA WITH ADVANCEMENT FLAP CLOSURE WITH SENTINEL NODE BIOPSY;  Surgeon: Almond Lint, MD;  Location: [facility];  Service: General;  Laterality: N/A;   HYSTEROSCOPY  06/19/2005   ASPIRATION OF LEFT PARATUBAL CYST   KNEE ARTHROSCOPY Right 2006   Dr Sherlean Foot   LAPAROSCOPIC BILATERAL SALPINGO OOPHERECTOMY Bilateral 03/04/2016   Procedure: LAPAROSCOPIC BILATERAL SALPINGO OOPHORECTOMY with pelvic washings;  Surgeon: Ok Edwards, MD;  Location: [facility];  Service: Gynecology;  Laterality: Bilateral;   LYSIS OF ADHESION N/A 03/04/2016   Procedure: LYSIS OF ADHESION;  Surgeon: Ok Edwards, MD;  Location: [facility];  Service: Gynecology;  Laterality: N/A;   RESECTOSCOPIC POLYPECTOMY     TOTAL KNEE ARTHROPLASTY Right 10/10/2015   Procedure: TOTAL RIGHT KNEE ARTHROPLASTY;  Surgeon: Gean Birchwood, MD;  Location: [facility];  Service: Orthopedics;  Laterality:  Right;   TUBAL LIGATION  2006   FILSHIE CLIP TECHNIQUE   UTERINE FIBROID SURGERY      Allergies: Allergies  Allergen Reactions   Metformin And Related Diarrhea    Outpatient Meds: Current Outpatient Medications  Medication Sig Dispense Refill   alendronate (FOSAMAX) 70 MG tablet Take 1 tablet (70 mg total) by mouth every 7 (seven) days. Take with a full glass of water on an empty stomach. 4 tablet 11   amLODipine (NORVASC) 10 MG tablet Take 1 tablet (10 mg total) by mouth daily. 90 tablet 3   aspirin 81 MG tablet Take 81 mg by mouth at bedtime.     benazepril (LOTENSIN) 10 MG tablet Take 1 tablet (10 mg total) by mouth daily. 90 tablet 3   calcium carbonate (OSCAL) 1500 (600 Ca) MG TABS tablet Take 1,500 mg by mouth 2 (two) times daily with a meal.     cholecalciferol (VITAMIN D3) 25 MCG (1000 UNIT) tablet Take 1,000 Units by mouth at bedtime.     Collagen-Boron-Hyaluronic Acid (MOVE FREE ULTRA JOINT HEALTH PO) Take 1 tablet by mouth at bedtime.     loratadine (CLARITIN) 10 MG tablet Take 10 mg by mouth at bedtime.      meloxicam (MOBIC) 15 MG tablet Take 1 tablet (15 mg total) by mouth daily. Take with food. 30 tablet 2   Misc Natural Products (FOCUSED MIND PO) Take 2 capsules by mouth at bedtime. Focus Factor  nystatin-triamcinolone (MYCOLOG II) cream Apply to affected area(s) 2 (two) times daily. 30 g 0   pantoprazole (PROTONIX) 20 MG tablet Take 1 tablet (20 mg total) by mouth daily. 90 tablet 3   Turmeric 500 MG CAPS Take 1,000 mg by mouth at bedtime.     Current Facility-Administered Medications  Medication Dose Route Frequency Provider Last Rate Last Admin   0.9 %  sodium chloride infusion  500 mL Intravenous Once Danis, Starr Lake III, MD          ___________________________________________________________________ Objective   Exam:  BP 133/67   Pulse 81   Temp 98.1 F (36.7 C) (Skin)   Ht 5\' 5"  (1.651 m)   Wt 227 lb (103 kg)   LMP 12/24/2015 (LMP Unknown)  Comment: bso-not sexually  SpO2 97%   BMI 37.77 kg/m   CV: regular , S1/S2 Resp: clear to auscultation bilaterally, normal RR and effort noted GI: soft, no tenderness, with active bowel sounds.   Assessment: Encounter Diagnosis  Name Primary?   Special screening for malignant neoplasms, colon Yes     Plan: Colonoscopy   The benefits and risks of the planned procedure(s) were described in detail with the patient or (when appropriate) their health care proxy.  Risks were outlined as including, but not limited to, bleeding, infection, perforation, adverse medication reaction leading to cardiac or pulmonary decompensation, pancreatitis (if ERCP).  The limitation of incomplete mucosal visualization was also discussed.  No guarantees or warranties were given.  The patient is appropriate for an endoscopic procedure in the ambulatory setting.   - Amada Jupiter, MD

## 2024-01-28 NOTE — Progress Notes (Signed)
 A/o x 3, VSS, gd SR's, pleased with anesthesia, report to RN

## 2024-01-28 NOTE — Patient Instructions (Signed)

## 2024-01-29 ENCOUNTER — Telehealth: Payer: Self-pay

## 2024-01-29 NOTE — Telephone Encounter (Signed)
 No answer after follow up call. Voice message left.

## 2024-02-05 ENCOUNTER — Other Ambulatory Visit (HOSPITAL_COMMUNITY): Payer: Self-pay

## 2024-02-05 ENCOUNTER — Other Ambulatory Visit: Payer: Self-pay

## 2024-02-05 DIAGNOSIS — M5416 Radiculopathy, lumbar region: Secondary | ICD-10-CM | POA: Diagnosis not present

## 2024-02-05 DIAGNOSIS — M1712 Unilateral primary osteoarthritis, left knee: Secondary | ICD-10-CM | POA: Diagnosis not present

## 2024-02-05 MED ORDER — PREDNISONE 10 MG (21) PO TBPK
ORAL_TABLET | ORAL | 0 refills | Status: DC
  Filled 2024-02-05 (×2): qty 21, 6d supply, fill #0

## 2024-02-15 ENCOUNTER — Other Ambulatory Visit (HOSPITAL_COMMUNITY): Payer: Self-pay

## 2024-02-15 DIAGNOSIS — M1712 Unilateral primary osteoarthritis, left knee: Secondary | ICD-10-CM | POA: Diagnosis not present

## 2024-02-15 MED ORDER — PREDNISONE 10 MG (21) PO TBPK
ORAL_TABLET | ORAL | 0 refills | Status: DC
  Filled 2024-02-15: qty 21, 6d supply, fill #0

## 2024-02-16 ENCOUNTER — Other Ambulatory Visit: Payer: Self-pay

## 2024-02-26 ENCOUNTER — Other Ambulatory Visit (HOSPITAL_COMMUNITY): Payer: Self-pay

## 2024-03-02 DIAGNOSIS — L570 Actinic keratosis: Secondary | ICD-10-CM | POA: Diagnosis not present

## 2024-03-02 DIAGNOSIS — D2261 Melanocytic nevi of right upper limb, including shoulder: Secondary | ICD-10-CM | POA: Diagnosis not present

## 2024-03-02 DIAGNOSIS — Z85820 Personal history of malignant melanoma of skin: Secondary | ICD-10-CM | POA: Diagnosis not present

## 2024-03-02 DIAGNOSIS — D2262 Melanocytic nevi of left upper limb, including shoulder: Secondary | ICD-10-CM | POA: Diagnosis not present

## 2024-03-02 DIAGNOSIS — L821 Other seborrheic keratosis: Secondary | ICD-10-CM | POA: Diagnosis not present

## 2024-03-04 DIAGNOSIS — G4733 Obstructive sleep apnea (adult) (pediatric): Secondary | ICD-10-CM | POA: Diagnosis not present

## 2024-03-27 ENCOUNTER — Other Ambulatory Visit: Payer: Self-pay

## 2024-03-30 ENCOUNTER — Other Ambulatory Visit (HOSPITAL_COMMUNITY): Payer: Self-pay

## 2024-03-30 ENCOUNTER — Other Ambulatory Visit: Payer: Self-pay | Admitting: Physician Assistant

## 2024-03-30 ENCOUNTER — Other Ambulatory Visit: Payer: Self-pay | Admitting: Obstetrics and Gynecology

## 2024-03-30 ENCOUNTER — Other Ambulatory Visit: Payer: Self-pay

## 2024-03-30 ENCOUNTER — Telehealth: Payer: Self-pay | Admitting: *Deleted

## 2024-03-30 DIAGNOSIS — R238 Other skin changes: Secondary | ICD-10-CM

## 2024-03-30 DIAGNOSIS — M170 Bilateral primary osteoarthritis of knee: Secondary | ICD-10-CM

## 2024-03-30 MED ORDER — MELOXICAM 15 MG PO TABS
15.0000 mg | ORAL_TABLET | Freq: Every day | ORAL | 2 refills | Status: DC
  Filled 2024-03-30: qty 30, 30d supply, fill #0

## 2024-03-30 MED ORDER — NYSTATIN-TRIAMCINOLONE 100000-0.1 UNIT/GM-% EX CREA
1.0000 | TOPICAL_CREAM | Freq: Two times a day (BID) | CUTANEOUS | 0 refills | Status: DC
  Filled 2024-03-30: qty 30, 15d supply, fill #0

## 2024-03-30 NOTE — Telephone Encounter (Signed)
 Patient returned call.  I spoke with patient, she wears her CPAP machine every night and states she received a new machine in 2024 through [facility] ([facility] stopped supplying CPAP machines).  Advised her I would reach out to [facility] to have us  tagged in her [place] [facility] account so we can see her downloaded information.  She does use the myair app to see her numbers.  I let her know there is nothing for her to do.  She verbalized understanding.  Nothing further needed.  I called [facility], I had to leave a VM requesting to be tagged in the patient's [employer] account, new CPAP was ordered in 2024.  Will await return call.

## 2024-03-30 NOTE — Telephone Encounter (Signed)
 Copied from CRM [id number]. Topic: General - Other >> Mar 30, 2024  8:31 AM [name] wrote: Reason for CRM: Patient is calling to inquire is nurses have gotten recent CPAP reading for her, as she has scheduled a follow up and would like to review them at her appointment. Please confirm with patient if readings have been received.  ATC patient x1.  LVM to return call.

## 2024-03-30 NOTE — Telephone Encounter (Signed)
 Medication refill request: nystatin   Last AEX:  12/30/23 Next AEX: 01/02/25 Last MMG (if hormonal medication request): n/a  Refill authorized: please advise

## 2024-03-31 ENCOUNTER — Other Ambulatory Visit: Payer: Self-pay

## 2024-04-22 ENCOUNTER — Other Ambulatory Visit (HOSPITAL_COMMUNITY): Payer: Self-pay

## 2024-04-22 ENCOUNTER — Other Ambulatory Visit: Payer: Self-pay

## 2024-04-26 ENCOUNTER — Other Ambulatory Visit (HOSPITAL_COMMUNITY): Payer: Self-pay

## 2024-04-26 DIAGNOSIS — M1712 Unilateral primary osteoarthritis, left knee: Secondary | ICD-10-CM | POA: Diagnosis not present

## 2024-04-26 MED ORDER — DICLOFENAC SODIUM 75 MG PO TBEC
75.0000 mg | DELAYED_RELEASE_TABLET | Freq: Two times a day (BID) | ORAL | 0 refills | Status: DC | PRN
  Filled 2024-04-26: qty 60, 30d supply, fill #0

## 2024-05-23 ENCOUNTER — Other Ambulatory Visit (HOSPITAL_COMMUNITY): Payer: Self-pay

## 2024-05-26 DIAGNOSIS — M1712 Unilateral primary osteoarthritis, left knee: Secondary | ICD-10-CM | POA: Diagnosis not present

## 2024-06-03 ENCOUNTER — Other Ambulatory Visit (HOSPITAL_BASED_OUTPATIENT_CLINIC_OR_DEPARTMENT_OTHER): Payer: Self-pay

## 2024-06-03 ENCOUNTER — Other Ambulatory Visit: Payer: Self-pay

## 2024-06-03 DIAGNOSIS — M1712 Unilateral primary osteoarthritis, left knee: Secondary | ICD-10-CM | POA: Diagnosis not present

## 2024-06-03 MED ORDER — MELOXICAM 15 MG PO TABS
15.0000 mg | ORAL_TABLET | Freq: Every day | ORAL | 1 refills | Status: DC
  Filled 2024-06-03: qty 30, 30d supply, fill #0
  Filled 2024-08-12: qty 30, 30d supply, fill #1

## 2024-06-08 ENCOUNTER — Ambulatory Visit: Payer: BC Managed Care – PPO | Admitting: Physician Assistant

## 2024-06-08 ENCOUNTER — Other Ambulatory Visit (HOSPITAL_COMMUNITY): Payer: Self-pay

## 2024-06-08 ENCOUNTER — Encounter: Payer: Self-pay | Admitting: Physician Assistant

## 2024-06-08 VITALS — BP 138/76 | HR 85 | Temp 98.2°F | Ht 65.0 in | Wt 235.8 lb

## 2024-06-08 DIAGNOSIS — M25562 Pain in left knee: Secondary | ICD-10-CM | POA: Diagnosis not present

## 2024-06-08 DIAGNOSIS — M170 Bilateral primary osteoarthritis of knee: Secondary | ICD-10-CM | POA: Diagnosis not present

## 2024-06-08 DIAGNOSIS — G8929 Other chronic pain: Secondary | ICD-10-CM

## 2024-06-08 DIAGNOSIS — E1165 Type 2 diabetes mellitus with hyperglycemia: Secondary | ICD-10-CM | POA: Diagnosis not present

## 2024-06-08 DIAGNOSIS — I10 Essential (primary) hypertension: Secondary | ICD-10-CM

## 2024-06-08 DIAGNOSIS — Z7984 Long term (current) use of oral hypoglycemic drugs: Secondary | ICD-10-CM

## 2024-06-08 DIAGNOSIS — R600 Localized edema: Secondary | ICD-10-CM

## 2024-06-08 DIAGNOSIS — E669 Obesity, unspecified: Secondary | ICD-10-CM

## 2024-06-08 LAB — COMPREHENSIVE METABOLIC PANEL WITH GFR
ALT: 42 U/L — ABNORMAL HIGH (ref 0–35)
AST: 24 U/L (ref 0–37)
Albumin: 4.8 g/dL (ref 3.5–5.2)
Alkaline Phosphatase: 40 U/L (ref 39–117)
BUN: 18 mg/dL (ref 6–23)
CO2: 28 meq/L (ref 19–32)
Calcium: 10.2 mg/dL (ref 8.4–10.5)
Chloride: 100 meq/L (ref 96–112)
Creatinine, Ser: 0.77 mg/dL (ref 0.40–1.20)
GFR: 83.9 mL/min (ref >60.00)
Glucose, Bld: 130 mg/dL — ABNORMAL HIGH (ref 70–99)
Potassium: 4 meq/L (ref 3.5–5.1)
Sodium: 138 meq/L (ref 135–145)
Total Bilirubin: 0.5 mg/dL (ref 0.2–1.2)
Total Protein: 8.2 g/dL (ref 6.0–8.3)

## 2024-06-08 LAB — HEMOGLOBIN A1C: Hgb A1c MFr Bld: 7 % — ABNORMAL HIGH (ref 4.6–6.5)

## 2024-06-08 LAB — MICROALBUMIN / CREATININE URINE RATIO
Creatinine,U: 124.8 mg/dL
Microalb Creat Ratio: 43.9 mg/g — ABNORMAL HIGH (ref 0.0–30.0)
Microalb, Ur: 5.5 mg/dL — ABNORMAL HIGH (ref 0.0–1.9)

## 2024-06-08 MED ORDER — METFORMIN HCL ER 500 MG PO TB24
500.0000 mg | ORAL_TABLET | Freq: Every evening | ORAL | 0 refills | Status: DC
  Filled 2024-06-08: qty 30, 30d supply, fill #0

## 2024-06-08 NOTE — Progress Notes (Signed)
 Patient ID: [name], female    DOB: [birth year], 58 y.o.   MRN: [id number]   Assessment & Plan:  Type 2 diabetes mellitus with hyperglycemia, without long-term current use of insulin (HCC) -     Microalbumin / creatinine urine ratio -     Hemoglobin A1c -     Comprehensive metabolic panel with GFR -     metFORMIN  HCl ER; Take 1 tablet (500 mg total) by mouth every evening.  Dispense: 30 tablet; Refill: 0  Chronic pain of left knee  Primary osteoarthritis of both knees  Essential hypertension  Localized edema      Assessment and Plan Assessment & Plan Type 2 diabetes mellitus Type 2 diabetes mellitus, historically diet and exercise controlled. She expresses concern about weight gain and difficulty managing blood sugar levels. Discussed potential use of metformin  for weight loss and sugar control. She previously experienced mild cramping with metformin  but is willing to try extended-release formulation. Discussed potential use of Ozempic or Mounjaro if metformin  is not effective, depending on insurance coverage. Rybelsus was mentioned as an oral alternative but noted to have slower results. - Prescribe extended-release metformin , 30 tablets, to be taken once daily with dinner - Monitor blood sugar levels and report via MyChart - Consider Ozempic or Mounjaro if metformin  is not effective, depending on insurance coverage Lab Results  Component Value Date   HGBA1C 6.6 (H) 12/09/2023   HGBA1C 6.8 (H) 08/20/2023   HGBA1C 6.4 07/26/2021     Essential hypertension Essential hypertension, well-managed on benazepril  and amlodipine . She reports lower extremity edema, possibly related to amlodipine . Discussed potential adjustment of antihypertensive regimen to address edema. - Consider reducing amlodipine  to 5 mg and increasing benazepril  to 20 mg if edema persists - Monitor blood pressure at home periodically  Chronic right knee pain with history of effusion and intra-articular  steroid injection Chronic right knee pain with effusion and intra-articular steroid injection. She reports significant pain and difficulty with mobility, impacting physical activity and weight management efforts. She has been receiving gel injections for knee pain and had a previous steroid injection with fluid aspiration. - Continue current management with gel injections for knee pain - Encourage low-impact exercises such as swimming or anti-gravity treadmill to maintain mobility  Lower extremity edema, likely medication-related and/or post-surgical Lower extremity edema, likely related to amlodipine  use and/or post-surgical changes following knee surgery and lymph node removal. She reports improvement with compression socks. Discussed the potential impact of dietary sodium on edema. - Continue use of compression socks to manage edema - Monitor for changes in edema, especially with dietary sodium intake  Obesity Obesity, with recent weight gain following a trip to [place]. She expresses frustration with weight management and interest in pharmacological support. Discussed lifestyle modifications including intermittent fasting and potential pharmacotherapy options. She is interested in slower weight loss options and is considering metformin  as a starting point. - Encourage intermittent fasting with a feeding window between 11 AM and 6 PM - Prescribe extended-release metformin  to aid in weight management - Discuss potential use of Ozempic or Mounjaro for weight loss if metformin  is not effective      Return in about 3 months (around 09/08/2024) for recheck/follow-up.    Subjective:    Chief Complaint  Patient presents with   Diabetes    Pt in office for diabetes follow up and A1C check; last A1C at 6.6; pt had steroid injection for knee in April and admits not being as active as  she should be, getting next gel injection next week;     HPI Discussed the use of AI scribe software for  clinical note transcription with the patient, who gave verbal consent to proceed.  History of Present Illness [name] is a 58 year old female with type 2 diabetes and hypertension who presents for a six-month follow-up.  She experiences knee pain that has worsened since February and March, coinciding with increased walking in preparation for a trip to [place]. The pain affects her ability to stay active, and she recalls an incident where she struggled to rise from a couch, resulting in significant pain and difficulty bearing weight on her leg. A steroid injection was administered after aspirating blood from her knee, providing immediate relief and allowing her to complete her trip, although she had to concentrate on each step due to the pain.  Her type 2 diabetes is managed with diet and exercise. She is concerned about weight gain after her trip to [place] despite efforts to control sugar intake. She experiences 'food noise' at night, leading to snacking, which she believes contributes to her weight gain. She has previously tried intermittent fasting and found it effective when adhered to.  She reports swelling in her ankles. Compression socks help manage the swelling.  She recalls a past experience with metformin , which caused mild cramping but no nausea. She is considering revisiting this medication to aid in weight loss and sugar control. She also mentions a past prescription for a diuretic, which she filled but never used.  She has a history of knee surgery and melanoma removal, which involved lymph node excision. Swelling worsened post-surgery but has been manageable until recently.  She is a grandmother and is actively involved in caring for her grandchildren.     Past Medical History:  Diagnosis Date   Anemia    Arthritis    Diverticulosis of colon (without mention of hemorrhage)    FIBROIDS, UTERUS    GERD    HYPERTENSION    Melanoma (HCC)    right leg   NSVD (normal  spontaneous vaginal delivery)    X3   Obesity, unspecified    Osteopenia 11/2017   T score -1.1 FRAX 4.8% / 0.2%   Other abnormal glucose    Sleep apnea    THROMBOCYTHEMIA     Past Surgical History:  Procedure Laterality Date   COLONOSCOPY     DIAGNOSTIC LAPAROSCOPY     EXCISION MELANOMA WITH SENTINEL LYMPH NODE BIOPSY N/A 07/22/2022   Procedure: WIDE LOCAL EXCISION RIGHT SHIN MELANOMA WITH ADVANCEMENT FLAP CLOSURE WITH SENTINEL NODE BIOPSY;  Surgeon: Aron Shoulders, MD;  Location: [facility];  Service: General;  Laterality: N/A;   HYSTEROSCOPY  06/19/2005   ASPIRATION OF LEFT PARATUBAL CYST   KNEE ARTHROSCOPY Right 2006   Dr Rubie   LAPAROSCOPIC BILATERAL SALPINGO OOPHERECTOMY Bilateral 03/04/2016   Procedure: LAPAROSCOPIC BILATERAL SALPINGO OOPHORECTOMY with pelvic washings;  Surgeon: Curlee VEAR Guan, MD;  Location: [facility];  Service: Gynecology;  Laterality: Bilateral;   LYSIS OF ADHESION N/A 03/04/2016   Procedure: LYSIS OF ADHESION;  Surgeon: Curlee VEAR Guan, MD;  Location: [facility];  Service: Gynecology;  Laterality: N/A;   RESECTOSCOPIC POLYPECTOMY     TOTAL KNEE ARTHROPLASTY Right 10/10/2015   Procedure: TOTAL RIGHT KNEE ARTHROPLASTY;  Surgeon: Dempsey Sensor, MD;  Location: [facility];  Service: Orthopedics;  Laterality: Right;   TUBAL LIGATION  2006   FILSHIE CLIP TECHNIQUE   UTERINE FIBROID SURGERY  Family History  Problem Relation Age of Onset   Breast cancer Mother 35       breast cancer   Cancer Mother 78       breast cancer   Stroke Mother    AVM Mother    Diabetes Father    Hypertension Father    Heart disease Father 10       CABG/CAD   Hyperlipidemia Father    Diabetes Maternal Grandmother    Colon cancer Neg Hx    Rectal cancer Neg Hx    Stomach cancer Neg Hx    Esophageal cancer Neg Hx     Social History   Tobacco Use   Smoking status: Former    Current packs/day: 0.00    Average packs/day: 0.3 packs/day for 7.0 years (1.8 ttl pk-yrs)    Types: Cigarettes     Start date: 26    Quit date: 81    Years since quitting: 34.6   Smokeless tobacco: Never  Vaping Use   Vaping status: Never Used  Substance Use Topics   Alcohol use: Yes    Comment: very occassionally   Drug use: No     Allergies  Allergen Reactions   Metformin  And Related Diarrhea    Review of Systems NEGATIVE UNLESS OTHERWISE INDICATED IN HPI      Objective:     BP 138/76 (BP Location: Left Arm, Patient Position: Sitting)   Pulse 85   Temp 98.2 F (36.8 C) (Temporal)   Ht 5' 5 (1.651 m)   Wt 235 lb 12.8 oz (107 kg)   LMP 12/24/2015 (LMP Unknown) Comment: bso-not sexually  SpO2 96%   BMI 39.24 kg/m   Wt Readings from Last 3 Encounters:  06/08/24 235 lb 12.8 oz (107 kg)  01/28/24 227 lb (103 kg)  01/06/24 227 lb (103 kg)    BP Readings from Last 3 Encounters:  06/08/24 138/76  01/28/24 (!) 154/85  01/20/24 120/78     Physical Exam Vitals and nursing note reviewed.  Constitutional:      Appearance: Normal appearance. She is obese. She is not toxic-appearing.  HENT:     Head: Normocephalic and atraumatic.     Right Ear: Tympanic membrane, ear canal and external ear normal.     Left Ear: Tympanic membrane, ear canal and external ear normal.     Nose: Nose normal.     Mouth/Throat:     Mouth: Mucous membranes are moist.  Eyes:     Extraocular Movements: Extraocular movements intact.     Conjunctiva/sclera: Conjunctivae normal.     Pupils: Pupils are equal, round, and reactive to light.  Cardiovascular:     Rate and Rhythm: Normal rate and regular rhythm.     Pulses: Normal pulses.     Heart sounds: Normal heart sounds.  Pulmonary:     Effort: Pulmonary effort is normal.     Breath sounds: Normal breath sounds.  Abdominal:     General: Abdomen is flat. Bowel sounds are normal.     Palpations: Abdomen is soft.  Musculoskeletal:        General: Normal range of motion.     Cervical back: Normal range of motion and neck supple.     Right lower  leg: Edema present.     Left lower leg: Edema present.  Skin:    General: Skin is warm and dry.  Neurological:     General: No focal deficit present.     Mental Status:  She is alert and oriented to person, place, and time.  Psychiatric:        Mood and Affect: Mood normal.        Behavior: Behavior normal.        Thought Content: Thought content normal.        Judgment: Judgment normal.             Issaac Shipper M Elandra Powell, PA-C

## 2024-06-08 NOTE — Patient Instructions (Signed)
  VISIT SUMMARY: Today, we discussed your type 2 diabetes, hypertension, knee pain, lower extremity edema, and recent weight gain. We reviewed your current medications and made some adjustments to better manage your conditions.  YOUR PLAN: TYPE 2 DIABETES MELLITUS: Your diabetes is currently managed with diet and exercise, but you are concerned about weight gain and blood sugar control. -Start taking extended-release metformin , one tablet daily with dinner. -Monitor your blood sugar levels and report them via MyChart. -If metformin  is not effective, we may consider Ozempic or Mounjaro, depending on your insurance coverage.  ESSENTIAL HYPERTENSION: Your blood pressure is well-managed with benazepril  and amlodipine , but you have some swelling in your legs. -If the swelling persists, we may reduce amlodipine  to 5 mg and increase benazepril  to 20 mg. -Monitor your blood pressure at home periodically.  CHRONIC RIGHT KNEE PAIN: You have ongoing knee pain that affects your mobility and physical activity. -Continue with gel injections for knee pain. -Engage in low-impact exercises like swimming or using an anti-gravity treadmill to maintain mobility.  LOWER EXTREMITY EDEMA: You have swelling in your legs, likely related to your medication and past surgery. -Continue using compression socks to manage the swelling. -Monitor for changes in swelling, especially with your dietary sodium intake.  OBESITY: You have gained weight recently and are interested in weight management options. -Try intermittent fasting with a feeding window between 11 AM and 6 PM. -Start taking extended-release metformin  to help with weight management. -We may consider Ozempic or Mounjaro for weight loss if metformin  is not effective.                      Contains text generated by [employer].                                 Contains text generated by [employer].

## 2024-06-09 ENCOUNTER — Ambulatory Visit: Payer: Self-pay | Admitting: Physician Assistant

## 2024-06-13 DIAGNOSIS — G4733 Obstructive sleep apnea (adult) (pediatric): Secondary | ICD-10-CM | POA: Diagnosis not present

## 2024-06-14 DIAGNOSIS — M1712 Unilateral primary osteoarthritis, left knee: Secondary | ICD-10-CM | POA: Diagnosis not present

## 2024-06-17 ENCOUNTER — Other Ambulatory Visit (HOSPITAL_COMMUNITY): Payer: Self-pay

## 2024-06-24 ENCOUNTER — Telehealth: Payer: Self-pay

## 2024-06-24 NOTE — Telephone Encounter (Signed)
 Called [facility] unable get ahold of them for download  Left message on patient phone to bring cpap for machine for sd card download on patient will leave mychart message.

## 2024-06-27 ENCOUNTER — Telehealth: Payer: Self-pay

## 2024-06-27 ENCOUNTER — Encounter: Payer: Self-pay | Admitting: Primary Care

## 2024-06-27 ENCOUNTER — Other Ambulatory Visit (HOSPITAL_COMMUNITY): Payer: Self-pay

## 2024-06-27 ENCOUNTER — Ambulatory Visit (INDEPENDENT_AMBULATORY_CARE_PROVIDER_SITE_OTHER): Admitting: Primary Care

## 2024-06-27 VITALS — BP 134/74 | HR 81 | Temp 97.9°F | Ht 65.0 in | Wt 233.0 lb

## 2024-06-27 DIAGNOSIS — Z6838 Body mass index (BMI) 38.0-38.9, adult: Secondary | ICD-10-CM | POA: Diagnosis not present

## 2024-06-27 DIAGNOSIS — G4733 Obstructive sleep apnea (adult) (pediatric): Secondary | ICD-10-CM

## 2024-06-27 DIAGNOSIS — E66812 Obesity, class 2: Secondary | ICD-10-CM | POA: Diagnosis not present

## 2024-06-27 DIAGNOSIS — E6609 Other obesity due to excess calories: Secondary | ICD-10-CM

## 2024-06-27 NOTE — Patient Instructions (Addendum)
   VISIT SUMMARY: During your visit, we discussed your obstructive sleep apnea and the use of your CPAP machine, as well as your recent diagnosis of diabetes and concerns about weight management. Your CPAP machine is functioning well, and your sleep apnea is well-controlled. We also talked about potential weight loss strategies and the management of your diabetes.  YOUR PLAN: -OBSTRUCTIVE SLEEP APNEA ON CPAP THERAPY: Obstructive sleep apnea is a condition where your airway becomes blocked during sleep, causing breathing pauses. Your CPAP machine is working well, with an average usage of 7 hours and 31 minutes per night and an apnea score of 1 event per hour. We will ensure your CPAP machine is tagged for compliance monitoring. Continue using your current CPAP settings and monitor the machine for any signs of needing replacement.  -OBESITY, BMI 38: Obesity is a condition where excess body fat affects your health. Your BMI is 38. We discussed weight loss strategies, including lifestyle changes and medications like GLP-1 receptor agonists, which can help with weight loss and may improve your sleep apnea. Continue taking metformin  for diabetes management and potential weight loss. Consider discussing GLP-1 medication such as Zepbound with your primary care provider and making lifestyle modifications such as diet and exercise. If significant weight loss is achieved, we may explore surgical options like Inspire if CPAP is not tolerated.  INSTRUCTIONS: Please follow up with your primary care provider to discuss the potential use of GLP-1 receptor agonists for weight loss and sleep apnea management. Continue using your CPAP machine as directed and monitor for any signs that it may need replacement. Maintain your current diabetes management plan with metformin  and focus on lifestyle modifications for weight loss.

## 2024-06-27 NOTE — Progress Notes (Signed)
 @Patient  ID: [name], female    DOB: Sep 28, 1966, 58 y.o.   MRN: [id number]  No chief complaint on file.   Referring provider: Allwardt, Mardy HERO, PA-C  HPI: 58 year old female, former smoker. PMH significant for OSA, HTN, type 2 DM, GERD. Patient of Dr. Jude.  Home sleep study in February 2020 showed severe obstructive sleep apnea, AHI 69.4 an hour with SPO2 low 64%.  On auto CPAP 5-15 cm H2O.   06/27/2024- Interim hx  Discussed the use of AI scribe software for clinical note transcription with the patient, who gave verbal consent to proceed.  History of Present Illness [name] is a 58 year old female with obstructive sleep apnea who presents for a one-year follow-up regarding her CPAP machine.  She has been experiencing issues with her CPAP machine, which is over 38 years old. The machine was originally obtained from [facility], but they stopped providing CPAP machines, and she has since been dealing with a different company. The machine is a rental that she ended up purchasing after her original machine had issues. She has been trying to get her machine enrolled in [employer] for two years without success.  She uses her CPAP machine every night as she cannot sleep without it. Her average usage from July 26th through August 24th was seven hours and thirty-one minutes per night. She experiences air leaks at a rate of twenty-three liters per minute at the ninety-fifth percentile. Despite this, her apnea events are well-controlled, with an average of one event per hour. She has noticed a reduction in the number of times she wakes up at night since using the CPAP, now only waking up once per night compared to two or three times previously.  She has been diagnosed with diabetes recently, with a hemoglobin A1c of seven. She has been prediabetic for a few years, with previous A1c levels in the sixes. She is currently managed by her PCP and is on metformin . She is trying to manage  her condition with diet and exercise, focusing on low-carb and low-sugar intake. She is concerned about potential side effects of other weight loss medications, such as nausea and vomiting.  Her social history includes a recent trip to [place], and she is trying to increase her physical activity, although she has experienced knee pain, which has been a barrier. She has an artificial knee and anticipates needing surgery on her other knee due to lack of cartilage. She is frustrated with her weight management efforts, describing herself as a 'yo-yo' dieter. Her children are health-conscious, which she finds encouraging.   Allergies  Allergen Reactions   Metformin  And Related Diarrhea    Immunization History  Administered Date(s) Administered   Fluzone Influenza virus vaccine,trivalent (IIV3), split virus 09/15/2007   Influenza Split 09/17/2011   Influenza Whole 09/18/2008, 09/17/2009   Influenza, Seasonal, Injecte, Preservative Fre 10/04/2012, 08/20/2023   Influenza,inj,Quad PF,6+ Mos 09/07/2014, 09/10/2015, 10/09/2016, 06/30/2017, 10/15/2018, 08/17/2019, 09/12/2020, 07/25/2021, 10/16/2022   PFIZER(Purple Top)SARS-COV-2 Vaccination 01/18/2020, 02/14/2020, 10/05/2020   Pneumococcal Polysaccharide-23 03/19/2009   Td 09/13/2007   Tdap 12/28/2017    Past Medical History:  Diagnosis Date   Anemia    Arthritis    Diverticulosis of colon (without mention of hemorrhage)    FIBROIDS, UTERUS    GERD    HYPERTENSION    Melanoma (HCC)    right leg   NSVD (normal spontaneous vaginal delivery)    X3   Obesity, unspecified    Osteopenia 11/2017  T score -1.1 FRAX 4.8% / 0.2%   Other abnormal glucose    Sleep apnea    THROMBOCYTHEMIA     Tobacco History: Social History   Tobacco Use  Smoking Status Former   Current packs/day: 0.00   Average packs/day: 0.3 packs/day for 7.0 years (1.8 ttl pk-yrs)   Types: Cigarettes   Start date: 62   Quit date: 64   Years since quitting: 34.6   Smokeless Tobacco Never   Counseling given: Not Answered   Outpatient Medications Prior to Visit  Medication Sig Dispense Refill   alendronate  (FOSAMAX ) 70 MG tablet Take 1 tablet (70 mg total) by mouth every 7 (seven) days. Take with a full glass of water on an empty stomach. 4 tablet 11   amLODipine  (NORVASC ) 10 MG tablet Take 1 tablet (10 mg total) by mouth daily. 90 tablet 3   aspirin  81 MG tablet Take 81 mg by mouth at bedtime.     benazepril  (LOTENSIN ) 10 MG tablet Take 1 tablet (10 mg total) by mouth daily. 90 tablet 3   calcium  carbonate (OSCAL) 1500 (600 Ca) MG TABS tablet Take 1,500 mg by mouth 2 (two) times daily with a meal.     cholecalciferol (VITAMIN D3) 25 MCG (1000 UNIT) tablet Take 1,000 Units by mouth at bedtime.     Collagen-Boron-Hyaluronic Acid (MOVE FREE ULTRA JOINT HEALTH PO) Take 1 tablet by mouth at bedtime.     loratadine  (CLARITIN ) 10 MG tablet Take 10 mg by mouth at bedtime.      meloxicam  (MOBIC ) 15 MG tablet Take 1 tablet (15 mg total) by mouth daily as needed for pain. 30 tablet 1   metFORMIN  (GLUCOPHAGE -XR) 500 MG 24 hr tablet Take 1 tablet (500 mg total) by mouth every evening. 30 tablet 0   Misc Natural Products (FOCUSED MIND PO) Take 2 capsules by mouth at bedtime. Focus Factor     nystatin -triamcinolone  (MYCOLOG II) cream Apply to affected area(s) 2 (two) times daily. 30 g 0   pantoprazole  (PROTONIX ) 20 MG tablet Take 1 tablet (20 mg total) by mouth daily. 90 tablet 3   Turmeric 500 MG CAPS Take 1,000 mg by mouth at bedtime.     No facility-administered medications prior to visit.   Review of Systems  Review of Systems  Constitutional: Negative.   Respiratory: Negative.    Psychiatric/Behavioral:  Positive for sleep disturbance.     Physical Exam  LMP 12/24/2015 (LMP Unknown) Comment: bso-not sexually Physical Exam Constitutional:      Appearance: Normal appearance.  HENT:     Head: Normocephalic and atraumatic.  Cardiovascular:     Rate  and Rhythm: Normal rate and regular rhythm.  Pulmonary:     Effort: Pulmonary effort is normal.     Breath sounds: Normal breath sounds.  Musculoskeletal:        General: Normal range of motion.  Skin:    General: Skin is warm and dry.  Neurological:     General: No focal deficit present.     Mental Status: She is alert and oriented to person, place, and time. Mental status is at baseline.  Psychiatric:        Mood and Affect: Mood normal.        Behavior: Behavior normal.        Thought Content: Thought content normal.        Judgment: Judgment normal.      Lab Results:  CBC    Component Value Date/Time  WBC 6.1 08/20/2023 1317   RBC 4.94 08/20/2023 1317   HGB 14.5 08/20/2023 1317   HGB 15.0 12/22/2017 1127   HGB 14.1 07/14/2011 1108   HCT 44.6 08/20/2023 1317   HCT 45.1 12/22/2017 1127   HCT 41.7 07/14/2011 1108   PLT 612.0 (H) 08/20/2023 1317   PLT 690 (H) 12/22/2017 1127   MCV 90.3 08/20/2023 1317   MCV 92 12/22/2017 1127   MCV 87.7 07/14/2011 1108   MCH 29.9 07/22/2022 0640   MCHC 32.5 08/20/2023 1317   RDW 14.3 08/20/2023 1317   RDW 14.3 12/22/2017 1127   RDW 14.3 07/14/2011 1108   LYMPHSABS 1.5 08/20/2023 1317   LYMPHSABS 1.8 12/22/2017 1127   LYMPHSABS 1.9 07/14/2011 1108   MONOABS 0.5 08/20/2023 1317   MONOABS 0.6 07/14/2011 1108   EOSABS 0.2 08/20/2023 1317   EOSABS 0.3 12/22/2017 1127   BASOSABS 0.0 08/20/2023 1317   BASOSABS 0.0 12/22/2017 1127   BASOSABS 0.0 07/14/2011 1108    BMET    Component Value Date/Time   NA 138 06/08/2024 1208   NA 141 12/22/2017 1127   K 4.0 06/08/2024 1208   CL 100 06/08/2024 1208   CO2 28 06/08/2024 1208   GLUCOSE 130 (H) 06/08/2024 1208   BUN 18 06/08/2024 1208   BUN 11 12/22/2017 1127   CREATININE 0.77 06/08/2024 1208   CREATININE 0.85 07/05/2020 1052   CALCIUM  10.2 06/08/2024 1208   GFRNONAA >60 07/22/2022 0640   GFRAA 88 12/22/2017 1127    BNP No results found for: BNP  ProBNP No results found  for: PROBNP  Imaging: No results found.   Assessment & Plan:   1. OSA (obstructive sleep apnea) (Primary) - Ambulatory Referral for DME  2. Class 2 obesity due to excess calories without serious comorbidity with body mass index (BMI) of 38.0 to 38.9 in adult   Assessment and Plan Assessment & Plan Obstructive sleep apnea on CPAP therapy Obstructive sleep apnea is well-controlled with CPAP therapy. Current CPAP machine is over 32 years old but functioning well. Average usage is 7 hours and 31 minutes per night with an apnea score of 1 event per hour. Air leaks are present at 23 liters per minute at the 95th percentile, which is moderate to large. No significant breakthrough apneas. Reports improved sleep quality and reduced nocturnal awakenings since starting CPAP therapy. - Ensure CPAP machine is tagged for compliance monitoring. - Renew CPAP supplies with [facility]   Obesity, BMI 38 Obesity with a BMI of 38. Discussed potential weight loss strategies including lifestyle modifications and pharmacotherapy. Currently on metformin  for diabetes management, which may aid in weight loss. Discussed GLP-1 receptor agonists such as Zepbound as options for weight loss, which may also improve obstructive sleep apnea. Discussed potential side effects of GLP-1 medication, including nausea, vomiting, diarrhea, and constipation, and strategies to mitigate these effects. Expresses concern about side effects but is open to considering medication if lifestyle modifications are insufficient. Discussed potential for weight loss to reduce sleep apnea severity and the possibility of discontinuing CPAP therapy with significant weight loss. Discussed surgical options such as Inspire if weight loss is not achievable and CPAP is intolerable.  - Continue metformin  for diabetes management and potential weight loss. - Discuss GLP-1 medication such as Zepbound with primary care provider for weight loss and sleep  apnea management. - Consider lifestyle modifications including diet and exercise for weight loss. - Consider surgical options such as Inspire if weight loss is unachievable and  CPAP is not tolerated.       Almarie LELON Ferrari, NP 06/27/2024

## 2024-06-27 NOTE — Telephone Encounter (Signed)
 I called and spoke to [name] from [facility] regarding a download. They could not find her in their system but if they had the serial number and device number, they could add it. I gave the information to her. [name] states she could tag our office in [name].   Our office was tagged but do data was shown. I called [facility] and spoke to [name]. [name] states she would fax this over. I gave her [employer]'s fax number and will await for this. NFN

## 2024-06-28 ENCOUNTER — Telehealth: Payer: Self-pay | Admitting: Primary Care

## 2024-06-28 NOTE — Telephone Encounter (Signed)
 Thanks

## 2024-06-28 NOTE — Telephone Encounter (Signed)
 Per Glade at [facility]- Patient received supplies 06/13/2024 and is not eligible at this time.  Will upload order to her chart so it is on file for when she is eligible for her 3 month resupply. thank You Just FYI

## 2024-07-07 ENCOUNTER — Other Ambulatory Visit (HOSPITAL_COMMUNITY): Payer: Self-pay

## 2024-07-07 ENCOUNTER — Other Ambulatory Visit: Payer: Self-pay | Admitting: Physician Assistant

## 2024-07-07 DIAGNOSIS — E1165 Type 2 diabetes mellitus with hyperglycemia: Secondary | ICD-10-CM

## 2024-07-08 ENCOUNTER — Other Ambulatory Visit (HOSPITAL_COMMUNITY): Payer: Self-pay

## 2024-07-08 ENCOUNTER — Other Ambulatory Visit: Payer: Self-pay

## 2024-07-08 MED ORDER — METFORMIN HCL ER 500 MG PO TB24
500.0000 mg | ORAL_TABLET | Freq: Every evening | ORAL | 0 refills | Status: DC
  Filled 2024-07-08: qty 90, 90d supply, fill #0

## 2024-07-16 ENCOUNTER — Other Ambulatory Visit (HOSPITAL_COMMUNITY): Payer: Self-pay

## 2024-08-12 ENCOUNTER — Other Ambulatory Visit: Payer: Self-pay | Admitting: Obstetrics and Gynecology

## 2024-08-12 ENCOUNTER — Other Ambulatory Visit: Payer: Self-pay

## 2024-08-12 DIAGNOSIS — R238 Other skin changes: Secondary | ICD-10-CM

## 2024-08-12 MED ORDER — NYSTATIN-TRIAMCINOLONE 100000-0.1 UNIT/GM-% EX CREA
1.0000 | TOPICAL_CREAM | Freq: Two times a day (BID) | CUTANEOUS | 0 refills | Status: AC
  Filled 2024-08-12: qty 30, 15d supply, fill #0

## 2024-08-12 NOTE — Telephone Encounter (Signed)
 Med refill request:nystatin -triamcinolone  (MYCOLOG II) cream Last AEX: 01/20/24 Next AEX: 01/02/25 Last MMG (if hormonal med)c Refill authorized: last rx 03/30/24 30g with 0 refills. Please approve or deny

## 2024-08-13 ENCOUNTER — Other Ambulatory Visit (HOSPITAL_COMMUNITY): Payer: Self-pay

## 2024-08-15 ENCOUNTER — Other Ambulatory Visit: Payer: Self-pay

## 2024-08-24 ENCOUNTER — Encounter: Payer: BC Managed Care – PPO | Admitting: Physician Assistant

## 2024-09-07 ENCOUNTER — Other Ambulatory Visit (HOSPITAL_COMMUNITY): Payer: Self-pay

## 2024-09-07 ENCOUNTER — Encounter: Payer: Self-pay | Admitting: Physician Assistant

## 2024-09-07 ENCOUNTER — Ambulatory Visit: Admitting: Physician Assistant

## 2024-09-07 VITALS — BP 144/70 | HR 76 | Temp 98.2°F | Ht 65.0 in | Wt 232.4 lb

## 2024-09-07 DIAGNOSIS — L814 Other melanin hyperpigmentation: Secondary | ICD-10-CM | POA: Diagnosis not present

## 2024-09-07 DIAGNOSIS — E1165 Type 2 diabetes mellitus with hyperglycemia: Secondary | ICD-10-CM

## 2024-09-07 DIAGNOSIS — Z0000 Encounter for general adult medical examination without abnormal findings: Secondary | ICD-10-CM | POA: Diagnosis not present

## 2024-09-07 DIAGNOSIS — E559 Vitamin D deficiency, unspecified: Secondary | ICD-10-CM

## 2024-09-07 DIAGNOSIS — I10 Essential (primary) hypertension: Secondary | ICD-10-CM | POA: Diagnosis not present

## 2024-09-07 DIAGNOSIS — G4733 Obstructive sleep apnea (adult) (pediatric): Secondary | ICD-10-CM

## 2024-09-07 DIAGNOSIS — Z7985 Long-term (current) use of injectable non-insulin antidiabetic drugs: Secondary | ICD-10-CM

## 2024-09-07 DIAGNOSIS — D1801 Hemangioma of skin and subcutaneous tissue: Secondary | ICD-10-CM | POA: Diagnosis not present

## 2024-09-07 DIAGNOSIS — L821 Other seborrheic keratosis: Secondary | ICD-10-CM | POA: Diagnosis not present

## 2024-09-07 DIAGNOSIS — Z85820 Personal history of malignant melanoma of skin: Secondary | ICD-10-CM | POA: Diagnosis not present

## 2024-09-07 DIAGNOSIS — Z23 Encounter for immunization: Secondary | ICD-10-CM | POA: Diagnosis not present

## 2024-09-07 LAB — CBC WITH DIFFERENTIAL/PLATELET
Basophils Absolute: 0 K/uL (ref 0.0–0.1)
Basophils Relative: 0.5 % (ref 0.0–3.0)
Eosinophils Absolute: 0.2 K/uL (ref 0.0–0.7)
Eosinophils Relative: 3.7 % (ref 0.0–5.0)
HCT: 40.9 % (ref 36.0–46.0)
Hemoglobin: 13.9 g/dL (ref 12.0–15.0)
Lymphocytes Relative: 22.2 % (ref 12.0–46.0)
Lymphs Abs: 1.5 K/uL (ref 0.7–4.0)
MCHC: 33.9 g/dL (ref 30.0–36.0)
MCV: 88.4 fl (ref 78.0–100.0)
Monocytes Absolute: 0.5 K/uL (ref 0.1–1.0)
Monocytes Relative: 7.6 % (ref 3.0–12.0)
Neutro Abs: 4.4 K/uL (ref 1.4–7.7)
Neutrophils Relative %: 66 % (ref 43.0–77.0)
Platelets: 604 K/uL — ABNORMAL HIGH (ref 150.0–400.0)
RBC: 4.63 Mil/uL (ref 3.87–5.11)
RDW: 14.4 % (ref 11.5–15.5)
WBC: 6.7 K/uL (ref 4.0–10.5)

## 2024-09-07 LAB — LIPID PANEL
Cholesterol: 179 mg/dL (ref 0–200)
HDL: 49.4 mg/dL (ref >39.00)
LDL Cholesterol: 93 mg/dL (ref 0–99)
NonHDL: 129.71
Total CHOL/HDL Ratio: 4
Triglycerides: 186 mg/dL — ABNORMAL HIGH (ref 0.0–149.0)
VLDL: 37.2 mg/dL (ref 0.0–40.0)

## 2024-09-07 LAB — COMPREHENSIVE METABOLIC PANEL WITH GFR
ALT: 35 U/L (ref 0–35)
AST: 27 U/L (ref 0–37)
Albumin: 4.8 g/dL (ref 3.5–5.2)
Alkaline Phosphatase: 48 U/L (ref 39–117)
BUN: 17 mg/dL (ref 6–23)
CO2: 28 meq/L (ref 19–32)
Calcium: 9.8 mg/dL (ref 8.4–10.5)
Chloride: 101 meq/L (ref 96–112)
Creatinine, Ser: 0.78 mg/dL (ref 0.40–1.20)
GFR: 82.46 mL/min (ref >60.00)
Glucose, Bld: 111 mg/dL — ABNORMAL HIGH (ref 70–99)
Potassium: 4.9 meq/L (ref 3.5–5.1)
Sodium: 139 meq/L (ref 135–145)
Total Bilirubin: 0.5 mg/dL (ref 0.2–1.2)
Total Protein: 7.9 g/dL (ref 6.0–8.3)

## 2024-09-07 LAB — HEMOGLOBIN A1C: Hgb A1c MFr Bld: 6.6 % — ABNORMAL HIGH (ref 4.6–6.5)

## 2024-09-07 MED ORDER — TIRZEPATIDE 2.5 MG/0.5ML ~~LOC~~ SOAJ
2.5000 mg | SUBCUTANEOUS | 0 refills | Status: DC
  Filled 2024-09-07: qty 2, 28d supply, fill #0

## 2024-09-07 NOTE — Progress Notes (Signed)
 Patient ID: [name], female    DOB: December 06, 1965, 58 y.o.   MRN: [id number]   Assessment & Plan:  Immunization due -     Flu vaccine trivalent PF, 6mos and older(Flulaval,Afluria,Fluarix,Fluzone) -     Pneumococcal conjugate vaccine 20-valent      Assessment and Plan Assessment & Plan Adult Wellness Visit Annual wellness visit conducted. Up to date on mammogram, Pap smear, and colon cancer screening. Due for an eye exam. Discussed general health maintenance and screenings. - Administered pneumonia and flu vaccines - Encouraged scheduling an eye exam  Type 2 diabetes mellitus A1c was 7.0 three months ago. Currently on metformin , tolerating well. Discussed potential addition of Mounjaro (tirzepatide) for better glycemic control and weight management. Concerns about side effects, particularly nausea, were addressed. Discussed the mechanism of action of Mounjaro, including its effects on brain receptors for food noise and insulin response. Discussed potential insurance coverage issues and alternative options like Ozempic or Wegovy if Mounjaro is not covered. Emphasized the importance of maintaining nutrition despite potential nausea. - Prescribed Mounjaro (tirzepatide) starting at 2.5 mg weekly - Continue metformin  - Monitor for side effects and adjust dosage as needed - Encouraged communication via MyChart after third shot to assess tolerance - Discussed potential use of Zofran  for nausea if needed  Essential hypertension Blood pressure readings at home are generally in the 130s/80s. Current medication regimen includes benazepril . Discussed the importance of monitoring blood pressure at home, especially during the upcoming holiday season. - Encouraged home blood pressure monitoring - Instructed to send blood pressure readings via MyChart  Obstructive sleep apnea Managed with CPAP.  Diverticulosis Recent episodes of diverticulitis. Managed with dietary modifications to prevent  flare-ups.  Immunization management Due for pneumonia and flu vaccines. Discussed the importance of these vaccines, especially given her diabetes and increased risk for respiratory infections. Discussed the availability of the shingles vaccine as an optional future consideration. - Administered pneumonia and flu vaccines - Discussed shingles vaccine as an optional future consideration      No follow-ups on file.    Subjective:    Chief Complaint  Patient presents with   Annual Exam    Annual exam with PCP     HPI Discussed the use of AI scribe software for clinical note transcription with the patient, who gave verbal consent to proceed.  History of Present Illness [name] is a 58 year old female who presents for her annual wellness visit.  She is due for an eye exam and is up to date on her mammogram, Pap smear, and colon cancer screening. She is receiving her pneumonia and flu vaccines today.  She has a history of diabetes and is currently taking metformin , one pill in the evening, without gastrointestinal issues. Her last A1c was 7.0 three months ago. She is concerned about her weight, noting no weight loss despite efforts. She also has sleep apnea and uses a CPAP machine.  She experiences knee and shoulder pain, describing it as feeling 'old and crutchy'. She takes meloxicam  daily, which she feels alleviates the pain. Previously, she took it sporadically but now uses it consistently.  She has a history of diverticulosis and can anticipate flare-ups, though it has been years since the last episode. She manages it with dietary changes, avoiding high-fiber foods during flare-ups.  She is on benazepril  for hypertension, maintaining the same dosage for years. Her home blood pressure readings are usually in the 130s over a bottom number under 80.  She  mentions a family history of thyroid  cancer but denies any personal history of thyroid  issues, gallstones, or gallbladder  problems. She recalls having gestational diabetes during her last pregnancy.     Past Medical History:  Diagnosis Date   Anemia    Arthritis    Diverticulosis of colon (without mention of hemorrhage)    FIBROIDS, UTERUS    GERD    HYPERTENSION    Melanoma (HCC)    right leg   NSVD (normal spontaneous vaginal delivery)    X3   Obesity, unspecified    Osteopenia 11/2017   T score -1.1 FRAX 4.8% / 0.2%   Other abnormal glucose    Sleep apnea    THROMBOCYTHEMIA     Past Surgical History:  Procedure Laterality Date   COLONOSCOPY     DIAGNOSTIC LAPAROSCOPY     EXCISION MELANOMA WITH SENTINEL LYMPH NODE BIOPSY N/A 07/22/2022   Procedure: WIDE LOCAL EXCISION RIGHT SHIN MELANOMA WITH ADVANCEMENT FLAP CLOSURE WITH SENTINEL NODE BIOPSY;  Surgeon: Aron Shoulders, MD;  Location: [facility];  Service: General;  Laterality: N/A;   HYSTEROSCOPY  06/19/2005   ASPIRATION OF LEFT PARATUBAL CYST   KNEE ARTHROSCOPY Right 2006   Dr Rubie   LAPAROSCOPIC BILATERAL SALPINGO OOPHERECTOMY Bilateral 03/04/2016   Procedure: LAPAROSCOPIC BILATERAL SALPINGO OOPHORECTOMY with pelvic washings;  Surgeon: Curlee VEAR Guan, MD;  Location: [facility];  Service: Gynecology;  Laterality: Bilateral;   LYSIS OF ADHESION N/A 03/04/2016   Procedure: LYSIS OF ADHESION;  Surgeon: Curlee VEAR Guan, MD;  Location: [facility];  Service: Gynecology;  Laterality: N/A;   RESECTOSCOPIC POLYPECTOMY     TOTAL KNEE ARTHROPLASTY Right 10/10/2015   Procedure: TOTAL RIGHT KNEE ARTHROPLASTY;  Surgeon: Dempsey Sensor, MD;  Location: [facility];  Service: Orthopedics;  Laterality: Right;   TUBAL LIGATION  2006   FILSHIE CLIP TECHNIQUE   UTERINE FIBROID SURGERY      Family History  Problem Relation Age of Onset   Breast cancer Mother 11       breast cancer   Cancer Mother 37       breast cancer   Stroke Mother    AVM Mother    Diabetes Father    Hypertension Father    Heart disease Father 9       CABG/CAD   Hyperlipidemia Father    Diabetes  Maternal Grandmother    Colon cancer Neg Hx    Rectal cancer Neg Hx    Stomach cancer Neg Hx    Esophageal cancer Neg Hx     Social History   Tobacco Use   Smoking status: Former    Current packs/day: 0.00    Average packs/day: 0.3 packs/day for 7.0 years (1.8 ttl pk-yrs)    Types: Cigarettes    Start date: 68    Quit date: 85    Years since quitting: 34.8   Smokeless tobacco: Never  Vaping Use   Vaping status: Never Used  Substance Use Topics   Alcohol use: Yes    Comment: very occassionally   Drug use: No     Allergies  Allergen Reactions   Metformin  And Related Diarrhea    Review of Systems NEGATIVE UNLESS OTHERWISE INDICATED IN HPI      Objective:     BP (!) 150/74   Pulse 76   Temp 98.2 F (36.8 C) (Temporal)   Ht 5' 5 (1.651 m)   Wt 232 lb 6.4 oz (105.4 kg)   LMP 12/24/2015 (LMP Unknown)  Comment: bso-not sexually  SpO2 96%   BMI 38.67 kg/m   Wt Readings from Last 3 Encounters:  09/07/24 232 lb 6.4 oz (105.4 kg)  06/27/24 233 lb (105.7 kg)  06/08/24 235 lb 12.8 oz (107 kg)    BP Readings from Last 3 Encounters:  09/07/24 (!) 150/74  06/27/24 134/74  06/08/24 138/76     Physical Exam          Alianny Toelle M Aniella Wandrey, PA-C

## 2024-09-08 LAB — VITAMIN D 25 HYDROXY (VIT D DEFICIENCY, FRACTURES): VITD: 29.91 ng/mL — ABNORMAL LOW (ref 30.00–100.00)

## 2024-09-08 LAB — VITAMIN B12: Vitamin B-12: 424 pg/mL (ref 211–911)

## 2024-09-08 LAB — TSH: TSH: 1.92 u[IU]/mL (ref 0.35–5.50)

## 2024-09-08 NOTE — Patient Instructions (Signed)
  VISIT SUMMARY: Today, you had your annual wellness visit. You received your pneumonia and flu vaccines, and we discussed your diabetes management, blood pressure, and other health concerns.  YOUR PLAN: ADULT WELLNESS VISIT: Annual wellness visit conducted. You are up to date on your mammogram, Pap smear, and colon cancer screening. You are due for an eye exam. -You received your pneumonia and flu vaccines today. -Please schedule an eye exam.  TYPE 2 DIABETES MELLITUS: Your A1c was 7.0 three months ago. You are currently taking metformin  and tolerating it well. We discussed adding Mounjaro (tirzepatide) to help with blood sugar control and weight management. -Start Mounjaro (tirzepatide) at 2.5 mg weekly. -Continue taking metformin . -Monitor for side effects and adjust dosage as needed. -Contact us  via MyChart after your third shot to assess how you are tolerating the medication. -If you experience nausea, we can consider using Zofran .  ESSENTIAL HYPERTENSION: Your blood pressure readings at home are generally in the 130s/80s. You are currently taking benazepril . -Continue monitoring your blood pressure at home, especially during the holiday season. -Send your blood pressure readings to us  via MyChart.  OBSTRUCTIVE SLEEP APNEA: You are managing this condition with a CPAP machine. -Continue using your CPAP machine as prescribed.  DIVERTICULOSIS: You have a history of diverticulosis and manage it with dietary changes to prevent flare-ups. -Continue managing your diet to prevent flare-ups.  IMMUNIZATION MANAGEMENT: You received your pneumonia and flu vaccines today. We discussed the importance of these vaccines, especially given your diabetes and increased risk for respiratory infections. -Consider getting the shingles vaccine in the future.                      Contains text generated by [employer].                                 Contains  text generated by [employer].

## 2024-09-09 ENCOUNTER — Encounter: Payer: Self-pay | Admitting: Physician Assistant

## 2024-09-09 ENCOUNTER — Ambulatory Visit: Payer: Self-pay | Admitting: Physician Assistant

## 2024-09-16 ENCOUNTER — Other Ambulatory Visit (HOSPITAL_COMMUNITY): Payer: Self-pay

## 2024-09-16 ENCOUNTER — Other Ambulatory Visit: Payer: Self-pay

## 2024-09-16 MED ORDER — MELOXICAM 15 MG PO TABS
15.0000 mg | ORAL_TABLET | Freq: Every day | ORAL | 1 refills | Status: AC
  Filled 2024-09-16: qty 30, 30d supply, fill #0
  Filled 2024-11-08: qty 30, 30d supply, fill #1

## 2024-09-19 DIAGNOSIS — H524 Presbyopia: Secondary | ICD-10-CM | POA: Diagnosis not present

## 2024-09-19 DIAGNOSIS — E119 Type 2 diabetes mellitus without complications: Secondary | ICD-10-CM | POA: Diagnosis not present

## 2024-09-19 DIAGNOSIS — H2513 Age-related nuclear cataract, bilateral: Secondary | ICD-10-CM | POA: Diagnosis not present

## 2024-09-19 LAB — OPHTHALMOLOGY REPORT-SCANNED

## 2024-09-21 DIAGNOSIS — G4733 Obstructive sleep apnea (adult) (pediatric): Secondary | ICD-10-CM | POA: Diagnosis not present

## 2024-09-30 ENCOUNTER — Other Ambulatory Visit (HOSPITAL_COMMUNITY): Payer: Self-pay

## 2024-09-30 ENCOUNTER — Other Ambulatory Visit: Payer: Self-pay | Admitting: Physician Assistant

## 2024-09-30 ENCOUNTER — Other Ambulatory Visit: Payer: Self-pay

## 2024-09-30 DIAGNOSIS — E1165 Type 2 diabetes mellitus with hyperglycemia: Secondary | ICD-10-CM

## 2024-10-03 ENCOUNTER — Other Ambulatory Visit: Payer: Self-pay | Admitting: Physician Assistant

## 2024-10-03 ENCOUNTER — Other Ambulatory Visit (HOSPITAL_COMMUNITY): Payer: Self-pay

## 2024-10-03 ENCOUNTER — Other Ambulatory Visit: Payer: Self-pay

## 2024-10-03 DIAGNOSIS — E1165 Type 2 diabetes mellitus with hyperglycemia: Secondary | ICD-10-CM

## 2024-10-03 DIAGNOSIS — G4733 Obstructive sleep apnea (adult) (pediatric): Secondary | ICD-10-CM

## 2024-10-03 MED ORDER — MOUNJARO 2.5 MG/0.5ML ~~LOC~~ SOAJ
2.5000 mg | SUBCUTANEOUS | 0 refills | Status: DC
  Filled 2024-10-03: qty 2, 28d supply, fill #0

## 2024-10-03 MED ORDER — METFORMIN HCL ER 500 MG PO TB24
500.0000 mg | ORAL_TABLET | Freq: Every evening | ORAL | 0 refills | Status: AC
  Filled 2024-10-03: qty 90, 90d supply, fill #0

## 2024-10-14 ENCOUNTER — Other Ambulatory Visit: Payer: Self-pay

## 2024-11-02 ENCOUNTER — Ambulatory Visit: Admitting: Physician Assistant

## 2024-11-02 ENCOUNTER — Other Ambulatory Visit (HOSPITAL_COMMUNITY): Payer: Self-pay

## 2024-11-02 VITALS — BP 124/76 | HR 82 | Temp 97.9°F | Resp 14 | Ht 65.0 in | Wt 220.0 lb

## 2024-11-02 DIAGNOSIS — Z7984 Long term (current) use of oral hypoglycemic drugs: Secondary | ICD-10-CM | POA: Diagnosis not present

## 2024-11-02 DIAGNOSIS — E1165 Type 2 diabetes mellitus with hyperglycemia: Secondary | ICD-10-CM

## 2024-11-02 DIAGNOSIS — I10 Essential (primary) hypertension: Secondary | ICD-10-CM

## 2024-11-02 DIAGNOSIS — M79644 Pain in right finger(s): Secondary | ICD-10-CM | POA: Diagnosis not present

## 2024-11-02 MED ORDER — TIRZEPATIDE 5 MG/0.5ML ~~LOC~~ SOAJ
5.0000 mg | SUBCUTANEOUS | 2 refills | Status: AC
  Filled 2024-11-02: qty 2, 28d supply, fill #0
  Filled 2024-11-28: qty 2, 28d supply, fill #1

## 2024-11-02 NOTE — Progress Notes (Signed)
 "   Patient ID: [name], female    DOB: [birth year], 58 y.o.   MRN: [id number]   Assessment & Plan:  Essential hypertension  Type 2 diabetes mellitus with hyperglycemia, without long-term current use of insulin (HCC) -     Tirzepatide ; Inject 5 mg into the skin once a week.  Dispense: 6 mL; Refill: 2  Finger pain, right    Assessment & Plan Essential hypertension Blood pressure is well-controlled at 124/76 mmHg on current regimen of amlodipine  10 mg and benazepril  10 mg. - Continue amlodipine  10 mg daily. - Continue benazepril  10 mg daily.  Type 2 diabetes mellitus Managed with tirzepatide  0.5 mg weekly and metformin  500 mg daily. Weight loss of 12 pounds since November. Last A1c was 6.6%. Reports mild side effects including cough and diarrhea, but tolerating tirzepatide  well. Discussed potential for hair loss with rapid weight loss, but no significant changes noted. Considering increasing tirzepatide  to 5 mg weekly due to perceived decrease in efficacy at current dose. Discussed travel considerations for tirzepatide , including refrigeration and documentation for travel. - Increased tirzepatide  to 5 mg weekly. - Continue metformin  500 mg daily. - Provided documentation for travel with tirzepatide . - Scheduled follow-up in March for A1c and diabetes management review. Lab Results  Component Value Date   HGBA1C 6.6 (H) 09/07/2024   HGBA1C 7.0 (H) 06/08/2024   HGBA1C 6.6 (H) 12/09/2023     Right finger pain Intermittent right finger pain, possibly related to overuse or strain. Pain exacerbated by pressure, such as holding a toothbrush. No visible swelling or bruising. Symptoms suggestive of possible tendonitis or strain. - Apply Voltaren  gel to the affected area 3-4 times daily. - Consider referral to sports medicine if symptoms persist or worsen.      Return in about 10 weeks (around 01/11/2025) for recheck/follow-up.    Subjective:    Chief Complaint  Patient  presents with   Hypertension    Expressed that her average was 130ish/70ish   Weight Loss    Expressed that has been doing well on it.    Hand Pain    Ringht pointer finger    HPI Discussed the use of AI scribe software for clinical note transcription with the patient, who gave verbal consent to proceed.  History of Present Illness [name] is a 58 year old female with essential hypertension who presents for a blood pressure follow-up visit.  Her blood pressure today is 124/76 mmHg. She reports that her blood pressure was a little elevated at her last visit. She is currently taking amlodipine  10 mg and benazepril  10 mg daily.  She is managing type 2 diabetes and weight with tirzepatide  0.5 mg weekly since November, resulting in a 12-pound weight loss. Her last A1c a month ago was 6.6%. She tolerates tirzepatide  well and is also taking metformin  500 mg every evening. She has concerns about potential hair loss associated with tirzepatide  but has not noticed significant changes.  She has been experiencing a persistent cough and nasal congestion since starting tirzepatide  before Thanksgiving, with intermittent improvement. She also had an episode of diarrhea on a Monday, which resolved by the evening.  She describes a recent onset of finger pain after scratching an itch, with symptoms of burning and tingling, particularly when holding a toothbrush. The pain improves with pressure on a specific spot. There is no visible swelling or bruising, but it was warm one night when it hurt significantly.  She is planning a trip to [place] in  February and is considering how to manage her medications during travel.     Past Medical History:  Diagnosis Date   Anemia    Arthritis    Diverticulosis of colon (without mention of hemorrhage)    FIBROIDS, UTERUS    GERD    HYPERTENSION    Melanoma (HCC)    right leg   NSVD (normal spontaneous vaginal delivery)    X3   Obesity, unspecified     Osteopenia 11/2017   T score -1.1 FRAX 4.8% / 0.2%   Other abnormal glucose    Sleep apnea    THROMBOCYTHEMIA     Past Surgical History:  Procedure Laterality Date   COLONOSCOPY     DIAGNOSTIC LAPAROSCOPY     EXCISION MELANOMA WITH SENTINEL LYMPH NODE BIOPSY N/A 07/22/2022   Procedure: WIDE LOCAL EXCISION RIGHT SHIN MELANOMA WITH ADVANCEMENT FLAP CLOSURE WITH SENTINEL NODE BIOPSY;  Surgeon: Aron Shoulders, MD;  Location: [facility];  Service: General;  Laterality: N/A;   HYSTEROSCOPY  06/19/2005   ASPIRATION OF LEFT PARATUBAL CYST   KNEE ARTHROSCOPY Right 2006   Dr Rubie   LAPAROSCOPIC BILATERAL SALPINGO OOPHERECTOMY Bilateral 03/04/2016   Procedure: LAPAROSCOPIC BILATERAL SALPINGO OOPHORECTOMY with pelvic washings;  Surgeon: Curlee VEAR Guan, MD;  Location: [facility];  Service: Gynecology;  Laterality: Bilateral;   LYSIS OF ADHESION N/A 03/04/2016   Procedure: LYSIS OF ADHESION;  Surgeon: Curlee VEAR Guan, MD;  Location: [facility];  Service: Gynecology;  Laterality: N/A;   RESECTOSCOPIC POLYPECTOMY     TOTAL KNEE ARTHROPLASTY Right 10/10/2015   Procedure: TOTAL RIGHT KNEE ARTHROPLASTY;  Surgeon: Dempsey Sensor, MD;  Location: [facility];  Service: Orthopedics;  Laterality: Right;   TUBAL LIGATION  2006   FILSHIE CLIP TECHNIQUE   UTERINE FIBROID SURGERY      Family History  Problem Relation Age of Onset   Breast cancer Mother 31       breast cancer   Cancer Mother 53       breast cancer   Stroke Mother    AVM Mother    Diabetes Father    Hypertension Father    Heart disease Father 53       CABG/CAD   Hyperlipidemia Father    Diabetes Maternal Grandmother    Colon cancer Neg Hx    Rectal cancer Neg Hx    Stomach cancer Neg Hx    Esophageal cancer Neg Hx     Social History[1]   Allergies[2]  Review of Systems NEGATIVE UNLESS OTHERWISE INDICATED IN HPI      Objective:     BP 124/76   Pulse 82   Temp 97.9 F (36.6 C) (Temporal)   Resp 14   Ht 5' 5 (1.651 m)   Wt 220 lb (99.8  kg)   LMP 12/24/2015 Comment: bso-not sexually  SpO2 98%   BMI 36.61 kg/m   Wt Readings from Last 3 Encounters:  11/02/24 220 lb (99.8 kg)  09/07/24 232 lb 6.4 oz (105.4 kg)  06/27/24 233 lb (105.7 kg)    BP Readings from Last 3 Encounters:  11/02/24 124/76  09/07/24 (!) 144/70  06/27/24 134/74     Physical Exam Vitals and nursing note reviewed.  Constitutional:      Appearance: Normal appearance. She is obese. She is not toxic-appearing.  HENT:     Head: Normocephalic and atraumatic.     Right Ear: Tympanic membrane, ear canal and external ear normal.     Left Ear: Tympanic  membrane, ear canal and external ear normal.     Nose: Nose normal.     Mouth/Throat:     Mouth: Mucous membranes are moist.  Eyes:     Extraocular Movements: Extraocular movements intact.     Conjunctiva/sclera: Conjunctivae normal.     Pupils: Pupils are equal, round, and reactive to light.  Cardiovascular:     Rate and Rhythm: Normal rate and regular rhythm.     Pulses: Normal pulses.     Heart sounds: Normal heart sounds.  Pulmonary:     Effort: Pulmonary effort is normal.     Breath sounds: Normal breath sounds.  Abdominal:     General: Abdomen is flat. Bowel sounds are normal.     Palpations: Abdomen is soft.  Musculoskeletal:        General: No swelling (indicates pain at base of R index finger, but normal strength and ROM). Normal range of motion.     Cervical back: Normal range of motion and neck supple.  Skin:    General: Skin is warm and dry.  Neurological:     General: No focal deficit present.     Mental Status: She is alert and oriented to person, place, and time.  Psychiatric:        Mood and Affect: Mood normal.        Behavior: Behavior normal.        Thought Content: Thought content normal.        Judgment: Judgment normal.             Ashvin Adelson M Samnang Shugars, PA-C     [1]  Social History Tobacco Use   Smoking status: Former    Current packs/day: 0.00     Average packs/day: 0.3 packs/day for 7.0 years (1.8 ttl pk-yrs)    Types: Cigarettes    Start date: 61    Quit date: 2    Years since quitting: 35.0   Smokeless tobacco: Never  Vaping Use   Vaping status: Never Used  Substance Use Topics   Alcohol use: Yes    Comment: very occassionally   Drug use: No  [2]  Allergies Allergen Reactions   Metformin  And Related Diarrhea   "

## 2024-11-08 ENCOUNTER — Other Ambulatory Visit (HOSPITAL_COMMUNITY): Payer: Self-pay

## 2024-11-28 ENCOUNTER — Other Ambulatory Visit (HOSPITAL_COMMUNITY): Payer: Self-pay

## 2024-12-09 ENCOUNTER — Other Ambulatory Visit: Payer: Self-pay

## 2024-12-09 ENCOUNTER — Other Ambulatory Visit: Payer: Self-pay | Admitting: Obstetrics and Gynecology

## 2024-12-09 ENCOUNTER — Other Ambulatory Visit (HOSPITAL_COMMUNITY): Payer: Self-pay

## 2024-12-09 MED ORDER — ALENDRONATE SODIUM 70 MG PO TABS
70.0000 mg | ORAL_TABLET | ORAL | 11 refills | Status: AC
  Filled 2024-12-09: qty 4, 28d supply, fill #0

## 2024-12-09 NOTE — Telephone Encounter (Signed)
 Med refill request: Fosamax   Last AEX: 01/20/24 Next AEX: 01/02/25 Last MMG (if hormonal med) Refill authorized:Last rx 12/30/23 #4 with 11 refills. Please Advise?

## 2025-01-02 ENCOUNTER — Ambulatory Visit: Payer: BC Managed Care – PPO | Admitting: Obstetrics and Gynecology

## 2025-01-11 ENCOUNTER — Ambulatory Visit: Admitting: Physician Assistant

## 2025-09-13 ENCOUNTER — Encounter: Admitting: Physician Assistant
